# Patient Record
Sex: Male | Born: 1950 | Race: White | Hispanic: No | Marital: Married | Smoking: Current every day smoker
Health system: Southern US, Community
[De-identification: ages and names within clinical notes are randomized; demographics above are authoritative.]

## PROBLEM LIST (undated history)

## (undated) DIAGNOSIS — I2510 Atherosclerotic heart disease of native coronary artery without angina pectoris: Secondary | ICD-10-CM

## (undated) DIAGNOSIS — G709 Myoneural disorder, unspecified: Secondary | ICD-10-CM

## (undated) DIAGNOSIS — T8859XA Other complications of anesthesia, initial encounter: Secondary | ICD-10-CM

## (undated) DIAGNOSIS — Z9889 Other specified postprocedural states: Secondary | ICD-10-CM

## (undated) DIAGNOSIS — M4800 Spinal stenosis, site unspecified: Secondary | ICD-10-CM

## (undated) DIAGNOSIS — J439 Emphysema, unspecified: Secondary | ICD-10-CM

## (undated) DIAGNOSIS — R7303 Prediabetes: Secondary | ICD-10-CM

## (undated) DIAGNOSIS — T4145XA Adverse effect of unspecified anesthetic, initial encounter: Secondary | ICD-10-CM

## (undated) DIAGNOSIS — E119 Type 2 diabetes mellitus without complications: Secondary | ICD-10-CM

## (undated) DIAGNOSIS — E785 Hyperlipidemia, unspecified: Secondary | ICD-10-CM

## (undated) DIAGNOSIS — Z720 Tobacco use: Secondary | ICD-10-CM

## (undated) DIAGNOSIS — R112 Nausea with vomiting, unspecified: Secondary | ICD-10-CM

## (undated) DIAGNOSIS — N529 Male erectile dysfunction, unspecified: Secondary | ICD-10-CM

## (undated) DIAGNOSIS — I700 Atherosclerosis of aorta: Secondary | ICD-10-CM

## (undated) DIAGNOSIS — K5790 Diverticulosis of intestine, part unspecified, without perforation or abscess without bleeding: Secondary | ICD-10-CM

## (undated) DIAGNOSIS — M1990 Unspecified osteoarthritis, unspecified site: Secondary | ICD-10-CM

## (undated) DIAGNOSIS — I714 Abdominal aortic aneurysm, without rupture, unspecified: Secondary | ICD-10-CM

## (undated) DIAGNOSIS — T7840XA Allergy, unspecified, initial encounter: Secondary | ICD-10-CM

## (undated) DIAGNOSIS — Z7982 Long term (current) use of aspirin: Secondary | ICD-10-CM

## (undated) DIAGNOSIS — K760 Fatty (change of) liver, not elsewhere classified: Secondary | ICD-10-CM

## (undated) DIAGNOSIS — C4491 Basal cell carcinoma of skin, unspecified: Secondary | ICD-10-CM

## (undated) DIAGNOSIS — I7143 Infrarenal abdominal aortic aneurysm, without rupture: Secondary | ICD-10-CM

## (undated) DIAGNOSIS — C801 Malignant (primary) neoplasm, unspecified: Secondary | ICD-10-CM

## (undated) DIAGNOSIS — M48061 Spinal stenosis, lumbar region without neurogenic claudication: Secondary | ICD-10-CM

## (undated) DIAGNOSIS — I5189 Other ill-defined heart diseases: Secondary | ICD-10-CM

## (undated) DIAGNOSIS — I10 Essential (primary) hypertension: Secondary | ICD-10-CM

## (undated) DIAGNOSIS — E559 Vitamin D deficiency, unspecified: Secondary | ICD-10-CM

## (undated) DIAGNOSIS — K219 Gastro-esophageal reflux disease without esophagitis: Secondary | ICD-10-CM

## (undated) DIAGNOSIS — L409 Psoriasis, unspecified: Secondary | ICD-10-CM

## (undated) DIAGNOSIS — K439 Ventral hernia without obstruction or gangrene: Secondary | ICD-10-CM

## (undated) DIAGNOSIS — Z860101 Personal history of adenomatous and serrated colon polyps: Secondary | ICD-10-CM

## (undated) DIAGNOSIS — I739 Peripheral vascular disease, unspecified: Secondary | ICD-10-CM

## (undated) DIAGNOSIS — Z87891 Personal history of nicotine dependence: Secondary | ICD-10-CM

## (undated) DIAGNOSIS — E7800 Pure hypercholesterolemia, unspecified: Secondary | ICD-10-CM

## (undated) HISTORY — DX: Myoneural disorder, unspecified: G70.9

## (undated) HISTORY — PX: WRIST SURGERY: SHX841

## (undated) HISTORY — PX: FRACTURE SURGERY: SHX138

## (undated) HISTORY — PX: SKIN CANCER EXCISION: SHX779

## (undated) HISTORY — DX: Hyperlipidemia, unspecified: E78.5

## (undated) HISTORY — PX: ELBOW SURGERY: SHX618

## (undated) HISTORY — DX: Allergy, unspecified, initial encounter: T78.40XA

## (undated) HISTORY — DX: Basal cell carcinoma of skin, unspecified: C44.91

## (undated) HISTORY — PX: BACK SURGERY: SHX140

## (undated) HISTORY — DX: Unspecified osteoarthritis, unspecified site: M19.90

## (undated) HISTORY — PX: SPINE SURGERY: SHX786

## (undated) HISTORY — DX: Personal history of nicotine dependence: Z87.891

## (undated) HISTORY — DX: Essential (primary) hypertension: I10

## (undated) HISTORY — DX: Spinal stenosis, site unspecified: M48.00

---
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
## 2004-03-21 ENCOUNTER — Other Ambulatory Visit: Payer: Self-pay

## 2005-09-30 DIAGNOSIS — E291 Testicular hypofunction: Secondary | ICD-10-CM | POA: Insufficient documentation

## 2006-04-06 ENCOUNTER — Ambulatory Visit: Payer: Self-pay | Admitting: Otolaryngology

## 2007-06-06 DIAGNOSIS — L219 Seborrheic dermatitis, unspecified: Secondary | ICD-10-CM | POA: Insufficient documentation

## 2007-06-06 DIAGNOSIS — K760 Fatty (change of) liver, not elsewhere classified: Secondary | ICD-10-CM | POA: Insufficient documentation

## 2008-09-01 ENCOUNTER — Ambulatory Visit: Payer: Self-pay | Admitting: Family Medicine

## 2008-12-30 ENCOUNTER — Ambulatory Visit: Payer: Self-pay | Admitting: Family Medicine

## 2009-01-28 ENCOUNTER — Ambulatory Visit: Payer: Self-pay | Admitting: Family Medicine

## 2009-10-12 DIAGNOSIS — M2550 Pain in unspecified joint: Secondary | ICD-10-CM | POA: Insufficient documentation

## 2010-04-02 ENCOUNTER — Ambulatory Visit: Payer: Self-pay | Admitting: Family Medicine

## 2010-08-25 ENCOUNTER — Ambulatory Visit: Payer: Self-pay | Admitting: Family Medicine

## 2011-09-30 ENCOUNTER — Ambulatory Visit: Payer: Self-pay | Admitting: Family Medicine

## 2011-10-03 DIAGNOSIS — R053 Chronic cough: Secondary | ICD-10-CM | POA: Insufficient documentation

## 2011-10-03 DIAGNOSIS — R05 Cough: Secondary | ICD-10-CM | POA: Insufficient documentation

## 2012-07-12 ENCOUNTER — Ambulatory Visit: Payer: Self-pay | Admitting: Family Medicine

## 2013-08-05 ENCOUNTER — Ambulatory Visit: Payer: Self-pay | Admitting: Family Medicine

## 2014-07-08 LAB — TSH: TSH: 3.51 u[IU]/mL (ref <=5.90)

## 2014-10-01 LAB — HEPATIC FUNCTION PANEL
ALT: 29 U/L (ref 10–40)
AST: 24 U/L (ref 14–40)

## 2014-10-01 LAB — BASIC METABOLIC PANEL
BUN: 11 mg/dL (ref 4–21)
CREATININE: 0.8 mg/dL (ref <=1.3)
GLUCOSE: 172 mg/dL
Potassium: 4.3 mmol/L (ref 3.4–5.3)
SODIUM: 142 mmol/L (ref 137–147)

## 2014-10-01 LAB — PSA: PSA: 0.3

## 2014-10-01 LAB — LIPID PANEL
Cholesterol: 210 mg/dL — AB (ref 0–200)
HDL: 25 mg/dL — AB (ref 35–70)
LDL Cholesterol: 111 mg/dL
TRIGLYCERIDES: 371 mg/dL — AB (ref 40–160)

## 2014-12-19 ENCOUNTER — Ambulatory Visit: Payer: Self-pay | Admitting: Gastroenterology

## 2014-12-19 LAB — HM COLONOSCOPY

## 2014-12-25 LAB — HEMOGLOBIN A1C: Hgb A1c MFr Bld: 6.8 % — AB (ref 4.0–6.0)

## 2015-04-24 DIAGNOSIS — M549 Dorsalgia, unspecified: Secondary | ICD-10-CM

## 2015-04-24 DIAGNOSIS — E119 Type 2 diabetes mellitus without complications: Secondary | ICD-10-CM | POA: Insufficient documentation

## 2015-04-24 DIAGNOSIS — G8929 Other chronic pain: Secondary | ICD-10-CM | POA: Insufficient documentation

## 2015-04-24 DIAGNOSIS — Z86010 Personal history of colonic polyps: Secondary | ICD-10-CM | POA: Insufficient documentation

## 2015-04-24 DIAGNOSIS — Z860101 Personal history of adenomatous and serrated colon polyps: Secondary | ICD-10-CM | POA: Insufficient documentation

## 2015-04-24 DIAGNOSIS — I10 Essential (primary) hypertension: Secondary | ICD-10-CM | POA: Insufficient documentation

## 2015-04-24 DIAGNOSIS — N509 Disorder of male genital organs, unspecified: Secondary | ICD-10-CM | POA: Insufficient documentation

## 2015-04-24 DIAGNOSIS — K219 Gastro-esophageal reflux disease without esophagitis: Secondary | ICD-10-CM | POA: Insufficient documentation

## 2015-04-24 DIAGNOSIS — J300 Vasomotor rhinitis: Secondary | ICD-10-CM | POA: Insufficient documentation

## 2015-04-24 DIAGNOSIS — E669 Obesity, unspecified: Secondary | ICD-10-CM | POA: Insufficient documentation

## 2015-04-24 DIAGNOSIS — N529 Male erectile dysfunction, unspecified: Secondary | ICD-10-CM | POA: Insufficient documentation

## 2015-04-24 DIAGNOSIS — D369 Benign neoplasm, unspecified site: Secondary | ICD-10-CM | POA: Insufficient documentation

## 2015-04-24 DIAGNOSIS — J309 Allergic rhinitis, unspecified: Secondary | ICD-10-CM | POA: Insufficient documentation

## 2015-04-30 ENCOUNTER — Encounter: Payer: Self-pay | Admitting: Family Medicine

## 2015-04-30 ENCOUNTER — Ambulatory Visit (INDEPENDENT_AMBULATORY_CARE_PROVIDER_SITE_OTHER): Payer: BC Managed Care – PPO | Admitting: Family Medicine

## 2015-04-30 VITALS — BP 102/66 | HR 73 | Temp 98.6°F | Resp 16 | Wt 215.0 lb

## 2015-04-30 DIAGNOSIS — D369 Benign neoplasm, unspecified site: Secondary | ICD-10-CM

## 2015-04-30 DIAGNOSIS — K219 Gastro-esophageal reflux disease without esophagitis: Secondary | ICD-10-CM | POA: Diagnosis not present

## 2015-04-30 DIAGNOSIS — R208 Other disturbances of skin sensation: Secondary | ICD-10-CM | POA: Diagnosis not present

## 2015-04-30 DIAGNOSIS — E785 Hyperlipidemia, unspecified: Secondary | ICD-10-CM

## 2015-04-30 DIAGNOSIS — E119 Type 2 diabetes mellitus without complications: Secondary | ICD-10-CM

## 2015-04-30 DIAGNOSIS — I10 Essential (primary) hypertension: Secondary | ICD-10-CM | POA: Diagnosis not present

## 2015-04-30 DIAGNOSIS — R200 Anesthesia of skin: Secondary | ICD-10-CM

## 2015-04-30 LAB — POCT GLYCOSYLATED HEMOGLOBIN (HGB A1C)
Est. average glucose Bld gHb Est-mCnc: 137
HEMOGLOBIN A1C: 6.4

## 2015-04-30 NOTE — Progress Notes (Signed)
Patient: Thomas Morgan Male    DOB: [birth year]   64 y.o.   MRN: [id number] Visit Date: 04/30/2015  Today's Provider: Lelon Huh, MD   Chief Complaint  Patient presents with  . Follow-up    4 month  . Diabetes  . Hypertension   Subjective:    Diabetes He presents for his follow-up diabetic visit. He has type 2 diabetes mellitus. His disease course has been stable. Hypoglycemia symptoms include headaches. Pertinent negatives for hypoglycemia include no dizziness. Pertinent negatives for diabetes include no blurred vision, no chest pain, no fatigue and no visual change. Risk factors for coronary artery disease include hypertension. He is compliant with treatment all of the time. His weight is stable.  Hypertension This is a chronic problem. The current episode started more than 1 year ago. The problem is unchanged. The problem is controlled. Associated symptoms include headaches. Pertinent negatives include no blurred vision, chest pain or palpitations. Risk factors for coronary artery disease include diabetes mellitus.     Diabetes Mellitus Type II, Follow-up:   Lab Results  Component Value Date   HGBA1C 6.4 04/30/2015   HGBA1C 6.8* 12/25/2014   Last seen for diabetes 3 months ago.  Management changes included Advised to continue to stay off metformin for now . He reports good compliance with treatment. He is not having side effects. none Current symptoms include Some numbness in both feet and have been unchanged. Home blood sugar records: fasting range: 130  Episodes of hypoglycemia? no   Current Insulin Regimen: n/a Most Recent Eye Exam: within the last year Weight trend: decreasing steadily Current diet: well balanced Current exercise: yard work  ------------------------------------------------------------------------   Hypertension, follow-up:  BP Readings from Last 3 Encounters:  04/30/15 102/66  12/25/14 134/72    He was last seen for hypertension 6  months ago.  BP at that visit was 136/86. Management changes since that visit include none .He reports good compliance with treatment. He is not having side effects. none  He is exercising. He is not adherent to low salt diet.   Outside blood pressures are 102/60 . He is experiencing none.  Patient denies none.   Use of agents associated with hypertension: none.  Wt Readings from Last 3 Encounters:  04/30/15 215 lb (97.523 kg)  12/25/14 219 lb (99.338 kg)    ------------------------------------------------------------------------  Hyperlipidemia.  He is doing well with current dose of lovastatin without adverse side effects. He is taking consistently. He is trying to follow a low saturated fat diet. Lovastatin was increased after labs checked in December.    Lab Results  Component Value Date   CHOL 210* 10/01/2014   HDL 25* 10/01/2014   LDLCALC 111 10/01/2014   TRIG 371* 10/01/2014       Previous Medications   ASPIRIN (ASPIR-LOW) 81 MG EC TABLET    Take by mouth.   HYDROCHLOROTHIAZIDE (HYDRODIURIL) 50 MG TABLET    Take by mouth.   LOVASTATIN (MEVACOR) 40 MG TABLET    Take by mouth.   MULTIPLE VITAMIN (MULTIVITAMINS PO)    Take by mouth.   OMEPRAZOLE (PRILOSEC) 40 MG CAPSULE    Take by mouth.   TADALAFIL (CIALIS) 5 MG TABLET    Take by mouth.   VITAMIN C (ASCORBIC ACID) 500 MG TABLET    Take by mouth.    Review of Systems  Constitutional: Negative for fatigue.  Eyes: Negative for blurred vision.  Cardiovascular: Negative for chest pain, palpitations  and leg swelling.  Neurological: Positive for headaches. Negative for dizziness.    History  Substance Use Topics  . Smoking status: Former Research scientist (life sciences)  . Smokeless tobacco: Not on file     Comment: Smoked about 1 pack per day, for 35 years, quit smoking in 2013. smoked cigarettes, Current every day smoker, uses e cigarettes.  . Alcohol Use: No   Objective:   BP 102/66 mmHg  Pulse 73  Temp(Src) 98.6 F (37 C) (Oral)   Resp 16  Wt 215 lb (97.523 kg)  SpO2 96%  Physical Exam   General Appearance:    Alert, cooperative, no distress, overweight  Eyes:    PERRL, conjunctiva/corneas clear, EOM's intact       Lungs:     Clear to auscultation bilaterally, respirations unlabored  Heart:    Regular rate and rhythm. +2 bipedal pulses.   Neurologic:   Awake, alert, oriented x 3. No apparent focal neurological           defect.        Results for orders placed or performed in visit on 04/30/15  POCT HgB A1C  Result Value Ref Range   Hemoglobin A1C 6.4    Est. average glucose Bld gHb Est-mCnc 137        Assessment & Plan:     1. Type 2 diabetes mellitus without complication Doing well with diet - POCT HgB A1C - Renal function panel  2. HLD (hyperlipidemia) Tolerating increase dose of lovastatin - Lipid panel - Hepatic function panel  3. Gastroesophageal reflux disease, esophagitis presence not specified Well controlled Patient Instructions  Try putting omeprazole on hold to see if you can control acid reflux with diet alone. If you have reflux symptoms more than twice a week, you will need to start back on omeprazole.   - Magnesium - Vitamin B12  4. Essential hypertension well controlled   5. Numbness in feet He states he has some mild numbness on the anterior-plantar aspect of both feet, but no burning. 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 Try OTC oral B12 and check B12 levels.  - Vitamin B12    Follow up: No Follow-up on file.      Lelon Huh, MD  [facility]

## 2015-04-30 NOTE — Patient Instructions (Signed)
Try putting omeprazole on hold to see if you can control acid reflux with diet alone. If you have reflux symptoms more than twice a week, you will need to start back on omeprazole.

## 2015-05-09 LAB — LIPID PANEL
CHOL/HDL RATIO: 6.1 ratio — AB (ref 0.0–5.0)
Cholesterol, Total: 183 mg/dL (ref 100–199)
HDL: 30 mg/dL — AB (ref >39)
LDL Calculated: 117 mg/dL — ABNORMAL HIGH (ref 0–99)
Triglycerides: 180 mg/dL — ABNORMAL HIGH (ref 0–149)
VLDL Cholesterol Cal: 36 mg/dL (ref 5–40)

## 2015-05-09 LAB — RENAL FUNCTION PANEL
Albumin: 4.4 g/dL (ref 3.6–4.8)
BUN/Creatinine Ratio: 20 (ref 10–22)
BUN: 17 mg/dL (ref 8–27)
CALCIUM: 9.4 mg/dL (ref 8.6–10.2)
CHLORIDE: 98 mmol/L (ref 97–108)
CO2: 25 mmol/L (ref 18–29)
CREATININE: 0.86 mg/dL (ref 0.76–1.27)
GFR calc non Af Amer: 92 mL/min/{1.73_m2} (ref >59)
GFR, EST AFRICAN AMERICAN: 106 mL/min/{1.73_m2} (ref >59)
Glucose: 117 mg/dL — ABNORMAL HIGH (ref 65–99)
PHOSPHORUS: 3.5 mg/dL (ref 2.5–4.5)
Potassium: 4 mmol/L (ref 3.5–5.2)
Sodium: 141 mmol/L (ref 134–144)

## 2015-05-09 LAB — HEPATIC FUNCTION PANEL
ALT: 15 IU/L (ref 0–44)
AST: 12 IU/L (ref 0–40)
Alkaline Phosphatase: 76 IU/L (ref 39–117)
BILIRUBIN TOTAL: 0.4 mg/dL (ref 0.0–1.2)
Bilirubin, Direct: 0.12 mg/dL (ref 0.00–0.40)
Total Protein: 6.4 g/dL (ref 6.0–8.5)

## 2015-05-09 LAB — VITAMIN B12: Vitamin B-12: 439 pg/mL (ref 211–946)

## 2015-05-09 LAB — MAGNESIUM: Magnesium: 2.2 mg/dL (ref 1.6–2.3)

## 2015-08-25 ENCOUNTER — Other Ambulatory Visit: Payer: Self-pay | Admitting: Family Medicine

## 2015-10-08 ENCOUNTER — Ambulatory Visit (INDEPENDENT_AMBULATORY_CARE_PROVIDER_SITE_OTHER): Payer: Medicare Other | Admitting: Family Medicine

## 2015-10-08 ENCOUNTER — Encounter: Payer: Self-pay | Admitting: Family Medicine

## 2015-10-08 VITALS — BP 120/78 | HR 72 | Temp 98.3°F | Resp 16 | Ht 69.18 in | Wt 222.0 lb

## 2015-10-08 DIAGNOSIS — E785 Hyperlipidemia, unspecified: Secondary | ICD-10-CM

## 2015-10-08 DIAGNOSIS — K219 Gastro-esophageal reflux disease without esophagitis: Secondary | ICD-10-CM

## 2015-10-08 DIAGNOSIS — E291 Testicular hypofunction: Secondary | ICD-10-CM

## 2015-10-08 DIAGNOSIS — Z0000 Encounter for general adult medical examination without abnormal findings: Secondary | ICD-10-CM

## 2015-10-08 DIAGNOSIS — Z1159 Encounter for screening for other viral diseases: Secondary | ICD-10-CM

## 2015-10-08 DIAGNOSIS — Z125 Encounter for screening for malignant neoplasm of prostate: Secondary | ICD-10-CM | POA: Diagnosis not present

## 2015-10-08 DIAGNOSIS — N528 Other male erectile dysfunction: Secondary | ICD-10-CM

## 2015-10-08 DIAGNOSIS — N529 Male erectile dysfunction, unspecified: Secondary | ICD-10-CM

## 2015-10-08 DIAGNOSIS — E119 Type 2 diabetes mellitus without complications: Secondary | ICD-10-CM | POA: Diagnosis not present

## 2015-10-08 DIAGNOSIS — Z720 Tobacco use: Secondary | ICD-10-CM | POA: Diagnosis not present

## 2015-10-08 DIAGNOSIS — R202 Paresthesia of skin: Secondary | ICD-10-CM

## 2015-10-08 DIAGNOSIS — Z23 Encounter for immunization: Secondary | ICD-10-CM

## 2015-10-08 DIAGNOSIS — I10 Essential (primary) hypertension: Secondary | ICD-10-CM

## 2015-10-08 DIAGNOSIS — R200 Anesthesia of skin: Secondary | ICD-10-CM | POA: Insufficient documentation

## 2015-10-08 MED ORDER — VARDENAFIL HCL 20 MG PO TABS: 20.0000 mg | ORAL_TABLET | Freq: Every day | ORAL | Status: DC | PRN

## 2015-10-08 NOTE — Progress Notes (Signed)
Patient: Thomas Morgan, Male    DOB: 08-01-1951, 64 y.o.   MRN: [id number] Visit Date: 10/08/2015  Today's Provider: Lelon Huh, MD   Chief Complaint  Patient presents with  . Medicare Wellness  . Diabetes  . Hypertension  . Gastroesophageal Reflux   Subjective:    Annual wellness visit Thomas Morgan is a 64 y.o. male. He feels fairly well. He reports exercising no/but stays active. He reports he is sleeping well.  -----------------------------------------------------------  Follow-up for GERD from 04/30/2015; recommended patient tr y to put omeprazole on hold for now and see if patient can control acid reflux with diet alone.    Diabetes Mellitus Type II, Follow-up:   Lab Results  Component Value Date   HGBA1C 6.4 04/30/2015   HGBA1C 6.8* 12/25/2014   Last seen for diabetes 5 months ago.  Management since then includes; no changes. He reports good compliance with treatment. He is having side effects. Burning, tingling, and numbness in both feet Current symptoms include  Burning, tingling, and numbness in both feet and have been stable. Home blood sugar records: fasting range: 130  Episodes of hypoglycemia? no   Current Insulin Regimen: n/a Most Recent Eye Exam: 09/24/2015 Weight trend: stable Prior visit with dietician: no Current diet: in general, a "healthy" diet   Current exercise: none  ----------------------------------------------------------------------   Hypertension, follow-up:  BP Readings from Last 3 Encounters:  10/08/15 120/78  04/30/15 102/66  12/25/14 134/72    He was last seen for hypertension 5 months ago.  BP at that visit was 102/66. Management since that visit includes; no changes.He reports good compliance with treatment. He is not having side effects. none  He is not exercising. He is adherent to low salt diet.   Outside blood pressures are normal. He is experiencing none.  Patient denies none.   Cardiovascular risk  factors include diabetes mellitus.  Use of agents associated with hypertension: none.   ----------------------------------------------------------------------    Numbness in feet States that both forefeet get numb and sting somewhat at night or when at rest. He has no calf pain and no pain when walking or exercise.   Mucous in chest Takes occasional allergy tablets. No coughing or wheezing. No dyspnea. Stays congested in his nose a lot.   ED States Cialis is not working as well as in the past. He is getting Married in May and wants to try different ED medication.     Review of Systems  Constitutional: Negative.   HENT: Positive for congestion and sinus pressure.   Eyes: Negative.   Respiratory: Positive for cough and chest tightness.        Last month has had chest tightness and a dry cough  Cardiovascular: Negative.   Gastrointestinal: Negative.   Endocrine: Positive for cold intolerance and polyuria.  Genitourinary: Negative.   Musculoskeletal: Positive for back pain, arthralgias and neck pain.  Skin: Negative.   Allergic/Immunologic: Positive for environmental allergies.  Neurological: Positive for numbness.       Bilateral numbness and tingling in feet, burning sensation. Right hand often goes to sleep/numb  Hematological: Negative.   Psychiatric/Behavioral: Negative.     Social History   Social History  . Marital Status: Married    Spouse Name: N/A  . Number of Children: N/A  . Years of Education: N/A   Occupational History  . Not on file.   Social History Main Topics  . Smoking status: Former Research scientist (life sciences)  . Smokeless  tobacco: Not on file     Comment: Smoked about 1 pack per day, for 35 years, quit smoking in 2013. smoked cigarettes, Current every day smoker, uses e cigarettes.  . Alcohol Use: No  . Drug Use: No  . Sexual Activity: Not on file   Other Topics Concern  . Not on file   Social History Narrative    Patient Active Problem List   Diagnosis Date  Noted  . Tobacco abuse 10/08/2015  . Numbness and tingling of foot 10/08/2015  . Allergic rhinitis 04/24/2015  . Back pain, chronic 04/24/2015  . Diabetes ([place]) 04/24/2015  . 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 GERD (gastroesophageal reflux disease) 04/24/2015  . Hypertension 04/24/2015  . Obesity 04/24/2015  . Disorder of male genital organs 04/24/2015  . Tubular adenoma 04/24/2015  . 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 Chronic cough 10/03/2011  . Ventral hernia 05/25/2010  . Hyperlipidemia 10/12/2009  . Arthralgia of multiple joints 10/12/2009  . Hypersomnia 08/22/2008  . Psoriasis 08/22/2008  . Hypersteatosis 06/06/2007  . 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 Tobacco abuse 10/24/1968    Past Surgical History  Procedure Laterality Date  . 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    Previous Medications   ASPIRIN (ASPIR-LOW) 81 MG EC TABLET    Take by mouth.   HYDROCHLOROTHIAZIDE (HYDRODIURIL) 50 MG TABLET    TAKE 1 TABLET BY MOUTH IN THE MORNING FOR BLOOD PRESSURE   LOVASTATIN (MEVACOR) 40 MG TABLET    Take by mouth.   MULTIPLE VITAMIN (MULTIVITAMINS PO)    Take by mouth.   OMEPRAZOLE (PRILOSEC) 40 MG CAPSULE    Take by mouth.   TADALAFIL (CIALIS) 5 MG TABLET    Take by mouth.   VITAMIN C (ASCORBIC ACID) 500 MG TABLET    Take by mouth.    Patient Care Team: Birdie Sons, MD as PCP - General (Family Medicine)     Objective:   Vitals: BP 120/78 mmHg  Pulse 72  Temp(Src) 98.3 F (36.8 C) (Oral)  Resp 16  Ht 5' 9.18" (1.757 m)  Wt 222 lb (100.699 kg)  BMI 32.62 kg/m2  SpO2 96%  Physical Exam   General Appearance:    Alert, cooperative, no distress, appears stated age,  obese  Head:    Normocephalic, without obvious abnormality, atraumatic  Eyes:    PERRL, conjunctiva/corneas clear, EOM's intact, fundi    benign, both eyes       Ears:    Normal TM's and external ear canals, both ears  Nose:   Nares normal, septum midline, mucosa normal, no drainage   or sinus tenderness  Throat:   Lips, mucosa, and tongue normal; teeth and gums normal  Neck:   Supple, symmetrical, trachea midline, no adenopathy;       thyroid:  No enlargement/tenderness/nodules; no carotid   bruit or JVD  Back:     Symmetric, no curvature, ROM normal, no CVA tenderness  Lungs:     Clear to auscultation bilaterally, respirations unlabored  Chest wall:    No tenderness or deformity  Heart:    Regular rate and rhythm, S1 and S2 normal, no murmur, rub   or gallop  Abdomen:  Soft, non-tender, bowel sounds active all four quadrants,    no masses, no organomegaly  Genitalia:    deferred  Rectal:    deferred  Extremities:   Extremities normal, atraumatic, no cyanosis or edema  Pulses:   2+ and symmetric all extremities  Skin:   Skin color, texture, turgor normal, no rashes or lesions  Lymph nodes:   Cervical, supraclavicular, and axillary nodes normal  Neurologic:   CNII-XII intact. Normal strength, sensation and reflexes      throughout   Diabetic Foot Exam - Simple   Simple Foot Form  Diabetic Foot exam was performed with the following findings:  Yes 10/08/2015 11:05 AM  Visual Inspection  No deformities, no ulcerations, no other skin breakdown bilaterally:  Yes  Sensation Testing  Intact to touch and monofilament testing bilaterally:  Yes  Pulse Check  Posterior Tibialis and Dorsalis pulse intact bilaterally:  Yes  Comments      Activities of Daily Living No flowsheet data found.  Fall Risk Assessment Fall Risk  10/08/2015  Falls in the past year? No     Depression Screen PHQ 2/9 Scores 10/08/2015  PHQ - 2 Score 0  PHQ- 9 Score 2    Cognitive Testing - 6-CIT   Correct? Score   What year is it? yes 0 0 or 4  What month is it? yes 0 0 or 3  Memorize:    Pia Mau,  41,  [place],      What time is it? (within 1 hour) yes 0 0 or 3  Count backwards from 20 yes 0 0, 2, or 4  Name the months of the year no 0 0, 2, or 4  Repeat name & address above yes 4 0, 2, 4, 6, 8, or 10       TOTAL SCORE  4/28   Interpretation:  Normal  Normal (0-7) Abnormal (8-28)    Audit-C Alcohol Use Screening  Question Answer Points  How often do you have alcoholic drink? monthly 1  On days you do drink alcohol, how many drinks do you typically consume? 1 or2 0  How oftey will you drink 6 or more in a total? never 1  Total Score:  1   A score of 3 or more in women, and 4 or more in men indicates increased risk for alcohol abuse, EXCEPT if all of the points are from question 1.     Assessment & Plan:     Annual Wellness Visit  Reviewed patient's Family Medical History Reviewed and updated list of patient's medical providers Assessment of cognitive impairment was done Assessed patient's functional ability Established a written schedule for health screening [place] Completed and Reviewed  Exercise Activities and Dietary recommendations Goals    None      Immunization History  Administered Date(s) Administered  . Influenza, High Dose Seasonal PF 10/08/2015  . Pneumococcal Conjugate-13 10/08/2015  . Pneumococcal Polysaccharide-23 09/10/2008  . Td 10/24/1997  . Tdap 06/06/2007    Health Maintenance  Topic Date Due  . Hepatitis C Screening  10[birth year]29/49  . FOOT EXAM  06/07/1960  . OPHTHALMOLOGY EXAM  06/07/1960  . URINE MICROALBUMIN  06/07/1960  . HIV Screening  06/07/1965  . ZOSTAVAX  06/07/2010  . INFLUENZA VACCINE  05/25/2015  . PNA vac Low Risk Adult (1 of 2 - PCV13) 06/08/2015  . HEMOGLOBIN A1C  10/31/2015  . TETANUS/TDAP  06/05/2017  . COLONOSCOPY  12/20/2019  Discussed health benefits of physical  activity, and encouraged him to engage in regular exercise appropriate for his age and condition.    --------------------------------------------------------------------------------   1. Annual physical exam   2. Type 2 diabetes mellitus without complication, without long-term current use of insulin (HCC) Diet controlled - Hemoglobin A1c  3. HLD (hyperlipidemia) He is tolerating lovastatin well with no adverse effects.    4. Essential hypertension Well controlled.  Continue current medications.   - EKG 12-Lead  5. Gastroesophageal reflux disease, esophagitis presence not specified Continue omeprazole which remains effective.   6. Testicular hypofunction   7. Tobacco abuse Counseled again on importance of smoking cessation. His fiance also smokes and he working on trying to get her to quit with him  - CT CHEST LUNG CA SCREEN LOW DOSE W/O CM; Future  8. Prostate cancer screening  - PSA  9. 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 Need for hepatitis C screening test  - Hepatitis C antibody  12. Need for influenza vaccination  - Flu vaccine HIGH DOSE PF  13. Need for pneumococcal vaccination  - Pneumococcal conjugate vaccine 13-valent IM  Recommended Shingles vaccine which he declined today.

## 2015-10-08 NOTE — Patient Instructions (Addendum)
Please contact your eyecare professional to schedule a routine eye exam  Use OTC fexofenadine or loratadine daily for allergies and sinus drainage.

## 2015-10-09 ENCOUNTER — Telehealth: Payer: Self-pay

## 2015-10-09 DIAGNOSIS — G5793 Unspecified mononeuropathy of bilateral lower limbs: Secondary | ICD-10-CM

## 2015-10-09 LAB — HEMOGLOBIN A1C
Est. average glucose Bld gHb Est-mCnc: 166 mg/dL
Hgb A1c MFr Bld: 7.4 % — ABNORMAL HIGH (ref 4.8–5.6)

## 2015-10-09 LAB — VITAMIN B12: VITAMIN B 12: 1319 pg/mL — AB (ref 211–946)

## 2015-10-09 LAB — HEPATITIS C ANTIBODY: Hep C Virus Ab: <0.1 s/co ratio (ref 0.0–0.9)

## 2015-10-09 LAB — VITAMIN D 25 HYDROXY (VIT D DEFICIENCY, FRACTURES): Vit D, 25-Hydroxy: 25.3 ng/mL — ABNORMAL LOW (ref 30.0–100.0)

## 2015-10-09 LAB — PSA: PROSTATE SPECIFIC AG, SERUM: 0.3 ng/mL (ref 0.0–4.0)

## 2015-10-09 MED ORDER — NORTRIPTYLINE HCL 25 MG PO CAPS: 25.0000 mg | ORAL_CAPSULE | Freq: Every day | ORAL | Status: DC

## 2015-10-09 NOTE — Telephone Encounter (Signed)
Patient advised as below and verbally voiced understanding. Prescription sent into pharmacy. Patient states he will call back to schedule a 3 month follow up appointment.

## 2015-10-09 NOTE — Telephone Encounter (Signed)
-----   Message from Birdie Sons, MD sent at 10/09/2015  2:04 PM EST ----- His hgbA1c is up 7.4, indicating average blood sugar of 166. He needs to get more strict with diet. Avoid sweets and starch foods, and exercise 150 minutes per week. All of his other labs are normal. 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 #30, rf x 3  Follow up o.v. 3-4 months regarding diabetes.

## 2015-10-22 ENCOUNTER — Other Ambulatory Visit: Payer: Self-pay | Admitting: Family Medicine

## 2015-10-22 ENCOUNTER — Encounter: Payer: Self-pay | Admitting: Family Medicine

## 2015-10-22 DIAGNOSIS — Z87891 Personal history of nicotine dependence: Secondary | ICD-10-CM

## 2015-10-22 HISTORY — DX: Personal history of nicotine dependence: Z87.891

## 2015-10-23 ENCOUNTER — Encounter: Payer: Self-pay | Admitting: Family Medicine

## 2015-10-23 ENCOUNTER — Inpatient Hospital Stay: Payer: Medicare Other | Attending: Family Medicine | Admitting: Family Medicine

## 2015-10-23 ENCOUNTER — Ambulatory Visit
Admission: RE | Admit: 2015-10-23 | Discharge: 2015-10-23 | Disposition: A | Discharge status: Home / Self Care | Payer: Medicare Other | Source: Ambulatory Visit | Attending: Family Medicine | Admitting: Family Medicine

## 2015-10-23 ENCOUNTER — Other Ambulatory Visit: Payer: Self-pay | Admitting: Family Medicine

## 2015-10-23 DIAGNOSIS — Z87891 Personal history of nicotine dependence: Secondary | ICD-10-CM

## 2015-10-23 DIAGNOSIS — Z122 Encounter for screening for malignant neoplasm of respiratory organs: Secondary | ICD-10-CM | POA: Diagnosis not present

## 2015-10-23 NOTE — Progress Notes (Signed)
In accordance with CMS guidelines, patient has meet eligibility criteria including age, absence of signs or symptoms of lung cancer, the specific calculation of cigarette smoking pack-years was 45 years and is a current smoker.   A shared decision-making session was conducted prior to the performance of CT scan. This includes one or more decision aids, includes benefits and harms of screening, follow-up diagnostic testing, over-diagnosis, false positive rate, and total radiation exposure.  Counseling on the importance of adherence to annual lung cancer LDCT screening, impact of co-morbidities, and ability or willingness to undergo diagnosis and treatment is imperative for compliance of the program.  Counseling on the importance of continued smoking cessation for former smokers; the importance of smoking cessation for current smokers and information about tobacco cessation interventions have been given to patient including the [place] at [facility], 1800 quit [place], as well as Cancer Center specific smoking cessation programs.  Written order for lung cancer screening with LDCT has been given to the patient and any and all questions have been answered to the best of my abilities.   Yearly follow up will be scheduled by Shawn Perkins, Thoracic Navigator.   

## 2015-10-24 ENCOUNTER — Telehealth: Payer: Self-pay | Admitting: Family Medicine

## 2015-10-24 NOTE — Telephone Encounter (Signed)
Lung CT is normal. Repeat yearly.

## 2015-10-26 ENCOUNTER — Telehealth: Payer: Self-pay | Admitting: *Deleted

## 2015-10-26 NOTE — Telephone Encounter (Signed)
Notified patient of LDCT lung cancer screening results of Lung Rads 1 finding with recommendation for 12 month follow up imaging.  Patient verbalizes understanding.   IMPRESSION: No suspicious pulmonary nodules. Lung-RADS Category 1, negative. Continue annual screening with low-dose chest CT without contrast in 12 months.

## 2015-10-27 ENCOUNTER — Other Ambulatory Visit: Payer: Self-pay | Admitting: Family Medicine

## 2015-10-27 NOTE — Telephone Encounter (Signed)
Advised patient of results.  

## 2015-11-17 ENCOUNTER — Encounter: Payer: Self-pay | Admitting: Family Medicine

## 2015-11-17 ENCOUNTER — Ambulatory Visit (INDEPENDENT_AMBULATORY_CARE_PROVIDER_SITE_OTHER): Payer: Medicare Other | Admitting: Family Medicine

## 2015-11-17 VITALS — BP 126/70 | HR 95 | Temp 98.7°F | Resp 18 | Wt 221.0 lb

## 2015-11-17 DIAGNOSIS — J069 Acute upper respiratory infection, unspecified: Secondary | ICD-10-CM | POA: Diagnosis not present

## 2015-11-17 DIAGNOSIS — H6121 Impacted cerumen, right ear: Secondary | ICD-10-CM

## 2015-11-17 DIAGNOSIS — R059 Cough, unspecified: Secondary | ICD-10-CM

## 2015-11-17 DIAGNOSIS — R05 Cough: Secondary | ICD-10-CM

## 2015-11-17 MED ORDER — FLUTICASONE PROPIONATE 50 MCG/ACT NA SUSP: 2.0000 | Freq: Every day | NASAL | Status: DC

## 2015-11-17 NOTE — Patient Instructions (Signed)
Take OTC Allegra (fexofenadine) or Claritin (loratadine) to reduce sinus drainage and cough

## 2015-11-17 NOTE — Progress Notes (Signed)
Patient: Thomas Morgan Male    DOB: 1951/02/06   65 y.o.   MRN: [id number] Visit Date: 11/17/2015  Today's Provider: Lelon Huh, MD   Chief Complaint  Patient presents with  . Ear Fullness    x2-3 weeks   Subjective:    Ear Fullness  There is pain in both (worse in right ear) ears. Episode onset: 2-3 weeks ago. The problem has been unchanged. There has been no fever. The patient is experiencing no pain. Associated symptoms include coughing (productive with white sputum) and rhinorrhea. Pertinent negatives include no abdominal pain, ear discharge, headaches, hearing loss, neck pain, rash, sore throat or vomiting. Treatments tried: Copywriter, advertising. The treatment provided mild relief.  Patient states he feels a tickle in his throat which causes him to cough.      Allergies  Allergen Reactions  . Crestor  [Rosuvastatin]   . Hydrocodone-Apap-Dietary Prod     Increase heartrate  . Lescol  [Fluvastatin]     Muscle Aches   Previous Medications   ASPIRIN (ASPIR-LOW) 81 MG EC TABLET    Take by mouth.   GLUCOSE BLOOD TEST STRIP    1 each by Other route daily. Use as instructed   HYDROCHLOROTHIAZIDE (HYDRODIURIL) 50 MG TABLET    TAKE 1 TABLET BY MOUTH IN THE MORNING FOR BLOOD PRESSURE   LOVASTATIN (MEVACOR) 40 MG TABLET    Take by mouth.   METFORMIN (GLUCOPHAGE-XR) 500 MG 24 HR TABLET    TAKE 1 TABLET BY MOUTH EVERY DAY   MULTIPLE VITAMIN (MULTIVITAMINS PO)    Take by mouth.   NORTRIPTYLINE (PAMELOR) 25 MG CAPSULE    Take 1 capsule (25 mg total) by mouth at bedtime. For Neuropathy of feet   OMEPRAZOLE (PRILOSEC) 40 MG CAPSULE    Take by mouth.   TADALAFIL (CIALIS) 5 MG TABLET    Take by mouth.   VARDENAFIL (LEVITRA) 20 MG TABLET    Take 1 tablet (20 mg total) by mouth daily as needed for erectile dysfunction.   VITAMIN C (ASCORBIC ACID) 500 MG TABLET    Take by mouth.    Review of Systems  Constitutional: Negative for fever, chills, diaphoresis, appetite change and fatigue.    HENT: Positive for postnasal drip and rhinorrhea. Negative for ear discharge, ear pain, hearing loss, mouth sores, nosebleeds and sore throat.   Eyes: Positive for discharge (watery eyes).  Respiratory: Positive for cough (productive with white sputum). Negative for chest tightness, shortness of breath and wheezing.   Cardiovascular: Negative for chest pain and palpitations.  Gastrointestinal: Negative for nausea, vomiting and abdominal pain.  Musculoskeletal: Negative for neck pain.  Skin: Negative for rash.  Neurological: Negative for headaches.    Social History  Substance Use Topics  . Smoking status: Smoker, Current Status Unknown -- 1.00 packs/day for 45 years    Types: Cigarettes  . Smokeless tobacco: Not on file  . Alcohol Use: No   Objective:   BP 126/70 mmHg  Pulse 95  Temp(Src) 98.7 F (37.1 C) (Oral)  Resp 18  Wt 221 lb (100.245 kg)  SpO2 95%  Physical Exam  General Appearance:    Alert, cooperative, no distress  HENT:   right ear canal obstructed with cerumen. Left TM normal without fluid or infection, neck without nodes, throat normal without erythema or exudate, sinuses nontender, post nasal drip noted and nasal mucosa pale and congested  Eyes:    PERRL, conjunctiva/corneas clear, EOM's intact  Lungs:     Clear to auscultation bilaterally, respirations unlabored  Heart:    Regular rate and rhythm  Neurologic:   Awake, alert, oriented x 3. No apparent focal neurological           defect.           Assessment & Plan:     1. Cough Likely secondary to post nasal drainage. Will try OTC claritin or allegra in addition to fluticasone nasal spray as below  2. Cerumen impaction, right After soaking with Debrox, ear canal was irrigated with water until clear. Patient tolerated procedure well.    3. Upper respiratory infection  - fluticasone (FLONASE) 50 MCG/ACT nasal spray; Place 2 sprays into both nostrils daily.  Dispense: 16 g; Refill: 6         Lelon Huh, MD  [facility]

## 2015-11-20 ENCOUNTER — Other Ambulatory Visit: Payer: Self-pay | Admitting: Family Medicine

## 2015-11-25 ENCOUNTER — Other Ambulatory Visit: Payer: Self-pay | Admitting: Family Medicine

## 2016-05-03 ENCOUNTER — Other Ambulatory Visit: Payer: Self-pay

## 2016-05-03 MED ORDER — HYDROCHLOROTHIAZIDE 50 MG PO TABS: ORAL_TABLET | ORAL | Status: DC

## 2016-05-03 NOTE — Telephone Encounter (Signed)
Pharmacy faxed refill request. 

## 2016-05-31 ENCOUNTER — Other Ambulatory Visit: Payer: Self-pay | Admitting: Family Medicine

## 2016-06-03 ENCOUNTER — Encounter: Payer: Self-pay | Admitting: Family Medicine

## 2016-06-03 ENCOUNTER — Ambulatory Visit (INDEPENDENT_AMBULATORY_CARE_PROVIDER_SITE_OTHER): Payer: Medicare Other | Admitting: Family Medicine

## 2016-06-03 VITALS — BP 148/76 | HR 60 | Temp 98.4°F | Resp 16 | Wt 209.0 lb

## 2016-06-03 DIAGNOSIS — R0609 Other forms of dyspnea: Secondary | ICD-10-CM

## 2016-06-03 DIAGNOSIS — R5383 Other fatigue: Secondary | ICD-10-CM | POA: Diagnosis not present

## 2016-06-03 NOTE — Progress Notes (Signed)
Patient: Thomas Morgan Male    DOB: [birth year]   65 y.o.   MRN: [id number] Visit Date: 06/03/2016  Today's Provider: Lelon Huh, MD   Chief Complaint  Patient presents with  . Fatigue    Started Tuesday 05/31/2016   Subjective:    HPI  Woke up 05/31/16 feeling extremely fatigued. Felt fine the days and night before. No difficulty sleeping. Returned from vacation to [employer] on 05/29/2016. No illnesses during vacation, no tick bits.  Gets a little  dyspnic exertion. No chest pain. No fevers, no joint swelling, no new aches or pains. Symptoms have not changed since onset three days ago. Has chronic allergy problems, no worse lately. Take an allergy pill three days ago, but known since. No new medications.     Allergies  Allergen Reactions  . Crestor  [Rosuvastatin]   . Hydrocodone-Apap-Dietary Prod     Increase heartrate  . Lescol  [Fluvastatin]     Muscle Aches   Current Meds  Medication Sig  . aspirin (ASPIR-LOW) 81 MG EC tablet Take by mouth.  . hydrochlorothiazide (HYDRODIURIL) 50 MG tablet Take 1 tablet (50 mg total) by mouth daily. PATIENT NEEDS TO SCHEDULE OFFICE VISIT FOR FOLLOW UP  . lovastatin (MEVACOR) 40 MG tablet TAKE 1 TABLET BY MOUTH DAILY  . Multiple Vitamin (MULTIVITAMINS PO) Take by mouth.  Marland Kitchen omeprazole (PRILOSEC) 40 MG capsule Take by mouth.  Glory Rosebush VERIO test strip TEST ONCE DAILY  . vitamin C (ASCORBIC ACID) 500 MG tablet Take by mouth.    Review of Systems  Constitutional: Positive for fatigue. Negative for activity change, appetite change, chills, diaphoresis, fever and unexpected weight change.  HENT: Positive for congestion, postnasal drip, rhinorrhea and sinus pressure. Negative for ear discharge, ear pain, nosebleeds, sore throat, tinnitus, trouble swallowing and voice change.   Eyes: Positive for discharge and itching.  Respiratory: Positive for shortness of breath. Negative for apnea, cough, choking, chest tightness, wheezing and  stridor.   Cardiovascular: Negative.   Gastrointestinal: Negative.   Musculoskeletal: Positive for arthralgias (Right shoulder pain.). Negative for back pain, gait problem, joint swelling, myalgias, neck pain and neck stiffness.  Neurological: Positive for dizziness and numbness (Numbness in his feet; also right hand ). Negative for tremors, seizures, syncope, weakness, light-headedness and headaches.  Hematological: Does not bruise/bleed easily.    Social History  Substance Use Topics  . Smoking status: Smoker, Current Status Unknown    Packs/day: 1.00    Years: 45.00    Types: Cigarettes  . Smokeless tobacco: Not on file  . Alcohol use No   Objective:   BP (!) 148/76 (BP Location: Left Arm, Patient Position: Sitting, Cuff Size: Normal)   Pulse 60   Temp 98.4 F (36.9 C) (Oral)   Resp 16   Wt 209 lb (94.8 kg)   SpO2 98%   BMI 29.99 kg/m   Physical Exam    General Appearance:    Alert, cooperative, no distress, appears stated age  Head:    Normocephalic, without obvious abnormality, atraumatic  Eyes:    PERRL, conjunctiva/corneas clear, EOM's intact, fundi    benign, both eyes       Ears:    Normal TM's and external ear canals, both ears  Nose:   Nares normal, septum midline, mucosa normal, no drainage   or sinus tenderness  Throat:   Lips, mucosa, and tongue normal; teeth and gums normal  Neck:   Supple,  symmetrical, trachea midline, no adenopathy;       thyroid:  No enlargement/tenderness/nodules; no carotid   bruit or JVD  Back:     Symmetric, no curvature, ROM normal, no CVA tenderness  Lungs:     Clear to auscultation bilaterally, respirations unlabored  Chest wall:    No tenderness or deformity  Heart:    Regular rate and rhythm, S1 and S2 normal, no murmur, rub   or gallop  Abdomen:     Soft, non-tender, bowel sounds active all four quadrants,    no masses, no organomegaly  Extremities:   Extremities normal, atraumatic, no cyanosis or edema  Pulses:   2+ and  symmetric all extremities  Skin:   Skin color, texture, turgor normal, no rashes or lesions  Lymph nodes:   Cervical, supraclavicular, and axillary nodes normal  Neurologic:   CNII-XII intact. Normal strength, sensation and reflexes      throughout   EKG: NSR, no ischemic changes.     Assessment & Plan:     1. Other fatigue Rapid onset, no clear etiology.  - EKG 12-Lead - CBC with Differential/Platelet - Comprehensive metabolic panel - T4 AND TSH - Brain natriuretic peptide  2. Dyspnea on exertion  - EKG 12-Lead - CBC with Differential/Platelet - Comprehensive metabolic panel - T4 AND TSH - Brain natriuretic peptide       Lelon Huh, MD  [facility]

## 2016-06-04 LAB — COMPREHENSIVE METABOLIC PANEL
A/G RATIO: 1.8 (ref 1.2–2.2)
ALT: 15 IU/L (ref 0–44)
AST: 14 IU/L (ref 0–40)
Albumin: 4.1 g/dL (ref 3.6–4.8)
Alkaline Phosphatase: 80 IU/L (ref 39–117)
BILIRUBIN TOTAL: 0.4 mg/dL (ref 0.0–1.2)
BUN/Creatinine Ratio: 21 (ref 10–24)
BUN: 16 mg/dL (ref 8–27)
CALCIUM: 9 mg/dL (ref 8.6–10.2)
CHLORIDE: 103 mmol/L (ref 96–106)
CO2: 26 mmol/L (ref 18–29)
Creatinine, Ser: 0.75 mg/dL — ABNORMAL LOW (ref 0.76–1.27)
GFR calc Af Amer: 111 mL/min/{1.73_m2} (ref >59)
GFR, EST NON AFRICAN AMERICAN: 96 mL/min/{1.73_m2} (ref >59)
GLOBULIN, TOTAL: 2.3 g/dL (ref 1.5–4.5)
Glucose: 109 mg/dL — ABNORMAL HIGH (ref 65–99)
POTASSIUM: 4.8 mmol/L (ref 3.5–5.2)
SODIUM: 142 mmol/L (ref 134–144)
Total Protein: 6.4 g/dL (ref 6.0–8.5)

## 2016-06-04 LAB — CBC WITH DIFFERENTIAL/PLATELET
BASOS: 1 %
Basophils Absolute: 0.1 10*3/uL (ref 0.0–0.2)
EOS (ABSOLUTE): 0.4 10*3/uL (ref 0.0–0.4)
EOS: 4 %
HEMATOCRIT: 42.3 % (ref 37.5–51.0)
Hemoglobin: 14.4 g/dL (ref 12.6–17.7)
IMMATURE GRANULOCYTES: 1 %
Immature Grans (Abs): 0.1 10*3/uL (ref 0.0–0.1)
LYMPHS ABS: 1.9 10*3/uL (ref 0.7–3.1)
Lymphs: 20 %
MCH: 31.5 pg (ref 26.6–33.0)
MCHC: 34 g/dL (ref 31.5–35.7)
MCV: 93 fL (ref 79–97)
MONOS ABS: 0.7 10*3/uL (ref 0.1–0.9)
Monocytes: 7 %
NEUTROS ABS: 6.8 10*3/uL (ref 1.4–7.0)
Neutrophils: 67 %
Platelets: 329 10*3/uL (ref 150–379)
RBC: 4.57 x10E6/uL (ref 4.14–5.80)
RDW: 13.7 % (ref 12.3–15.4)
WBC: 9.9 10*3/uL (ref 3.4–10.8)

## 2016-06-04 LAB — BRAIN NATRIURETIC PEPTIDE: BNP: 49.4 pg/mL (ref 0.0–100.0)

## 2016-06-04 LAB — T4 AND TSH
T4 TOTAL: 5.9 ug/dL (ref 4.5–12.0)
TSH: 4.08 u[IU]/mL (ref 0.450–4.500)

## 2016-06-28 ENCOUNTER — Telehealth: Payer: Self-pay

## 2016-06-28 NOTE — Telephone Encounter (Signed)
Opened in error.   Thanks,   -Laura  

## 2016-07-05 ENCOUNTER — Ambulatory Visit (INDEPENDENT_AMBULATORY_CARE_PROVIDER_SITE_OTHER): Payer: Medicare Other | Admitting: Family Medicine

## 2016-07-05 ENCOUNTER — Encounter: Payer: Self-pay | Admitting: Family Medicine

## 2016-07-05 VITALS — BP 132/82 | HR 72 | Temp 97.8°F | Resp 16 | Wt 212.0 lb

## 2016-07-05 DIAGNOSIS — E119 Type 2 diabetes mellitus without complications: Secondary | ICD-10-CM | POA: Diagnosis not present

## 2016-07-05 DIAGNOSIS — Z23 Encounter for immunization: Secondary | ICD-10-CM

## 2016-07-05 DIAGNOSIS — I10 Essential (primary) hypertension: Secondary | ICD-10-CM

## 2016-07-05 DIAGNOSIS — E785 Hyperlipidemia, unspecified: Secondary | ICD-10-CM

## 2016-07-05 LAB — POCT GLYCOSYLATED HEMOGLOBIN (HGB A1C)
Est. average glucose Bld gHb Est-mCnc: 134
Hemoglobin A1C: 6.3

## 2016-07-05 MED ORDER — LOVASTATIN 40 MG PO TABS: 40.0000 mg | ORAL_TABLET | Freq: Every day | ORAL | 3 refills | Status: DC

## 2016-07-05 MED ORDER — HYDROCHLOROTHIAZIDE 50 MG PO TABS: 50.0000 mg | ORAL_TABLET | Freq: Every day | ORAL | 3 refills | Status: DC

## 2016-07-05 NOTE — Progress Notes (Signed)
Patient: Thomas Morgan Male    DOB: 24-Apr-1951   65 y.o.   MRN: [id number] Visit Date: 07/05/2016  Today's Provider: Lelon Huh, MD   Chief Complaint  Patient presents with  . Diabetes  . Hypertension  . Hyperlipidemia   Subjective:    HPI  Diabetes Mellitus Type II, Follow-up:   Lab Results  Component Value Date   HGBA1C 6.3 07/05/2016   HGBA1C 7.4 (H) 10/08/2015   HGBA1C 6.4 04/30/2015   Last seen for diabetes 10/08/2015.  Management since then includes no changes. He reports poor compliance with treatment. He is not having side effects.  Current symptoms include paresthesia of the feet and have been stable.Was prescribed nortriptyline at last visit, but he did not take it.  Home blood sugar records: 130's  Episodes of hypoglycemia? no   Current Insulin Regimen:  Most Recent Eye Exam:  Weight trend: stable Prior visit with dietician: no Current diet: in general, a "healthy" diet   Current exercise: walking  ------------------------------------------------------------------------   Hypertension, follow-up:  BP Readings from Last 3 Encounters:  07/05/16 132/82  06/03/16 (!) 148/76  11/17/15 126/70    He was last seen for hypertension 10/08/2015.  BP at that visit was 120/78. Management since that visit includes no changes.He reports excellent compliance with treatment. He is not having side effects.  He is exercising. He is adherent to low salt diet.   Outside blood pressures are stable. He is experiencing none.  Patient denies chest pain.   Cardiovascular risk factors include advanced age (older than 109 for men, 23 for women), diabetes mellitus, hypertension and smoking/ tobacco exposure.  Use of agents associated with hypertension: none.   ------------------------------------------------------------------------    Lipid/Cholesterol, Follow-up:   Last seen for this 10/08/2015 Management since that visit includes no changes.  Last  Lipid Panel:    Component Value Date/Time   CHOL 183 05/08/2015 1120   TRIG 180 (H) 05/08/2015 1120   HDL 30 (L) 05/08/2015 1120   CHOLHDL 6.1 (H) 05/08/2015 1120   LDLCALC 117 (H) 05/08/2015 1120    He reports excellent compliance with treatment. He is not having side effects.   Wt Readings from Last 3 Encounters:  07/05/16 212 lb (96.2 kg)  06/03/16 209 lb (94.8 kg)  11/17/15 221 lb (100.2 kg)    ------------------------------------------------------------------------  Patient c/o of congestion, reports worse in the morning and when lying down. Patient reports he has been using Mucinex and Claritin and reports relief, but would like a prescription due to cost.     Allergies  Allergen Reactions  . Crestor  [Rosuvastatin]   . Hydrocodone-Apap-Dietary Prod     Increase heartrate  . Lescol  [Fluvastatin]     Muscle Aches     Current Outpatient Prescriptions:  .  aspirin (ASPIR-LOW) 81 MG EC tablet, Take by mouth., Disp: , Rfl:  .  hydrochlorothiazide (HYDRODIURIL) 50 MG tablet, Take 1 tablet (50 mg total) by mouth daily., Disp: 90 tablet, Rfl: 3 .  lovastatin (MEVACOR) 40 MG tablet, Take 1 tablet (40 mg total) by mouth daily., Disp: 90 tablet, Rfl: 3 .  Multiple Vitamin (MULTIVITAMINS PO), Take by mouth., Disp: , Rfl:  .  omeprazole (PRILOSEC) 40 MG capsule, Take by mouth., Disp: , Rfl:  .  vitamin C (ASCORBIC ACID) 500 MG tablet, Take by mouth., Disp: , Rfl:  .  ONETOUCH VERIO test strip, TEST ONCE DAILY, Disp: 100 each, Rfl: 4 .  tadalafil (CIALIS)  5 MG tablet, Take by mouth., Disp: , Rfl:  .  vardenafil (LEVITRA) 20 MG tablet, Take 1 tablet (20 mg total) by mouth daily as needed for erectile dysfunction. (Patient not taking: Reported on 06/03/2016), Disp: 10 tablet, Rfl: 3  Review of Systems  Constitutional: Negative.   HENT: Positive for congestion and postnasal drip.   Respiratory: Negative.   Cardiovascular: Negative.   Endocrine: Negative.     Social History    Substance Use Topics  . Smoking status: Smoker, Current Status Unknown    Packs/day: 1.00    Years: 45.00    Types: Cigarettes  . Smokeless tobacco: Never Used  . Alcohol use No   Objective:   BP 132/82 (BP Location: Right Arm, Patient Position: Sitting, Cuff Size: Large)   Pulse 72   Temp 97.8 F (36.6 C) (Oral)   Resp 16   Wt 212 lb (96.2 kg)   SpO2 97%   BMI 30.42 kg/m   Physical Exam   General Appearance:    Alert, cooperative, no distress, obese  Eyes:    PERRL, conjunctiva/corneas clear, EOM's intact       Lungs:     Clear to auscultation bilaterally, respirations unlabored  Heart:    Regular rate and rhythm  Neurologic:   Awake, alert, oriented x 3. No apparent focal neurological           defect.       Results for orders placed or performed in visit on 07/05/16  POCT glycosylated hemoglobin (Hb A1C)  Result Value Ref Range   Hemoglobin A1C 6.3    Est. average glucose Bld gHb Est-mCnc 134         Assessment & Plan:     1. Diabetes mellitus without complication ([place]) Well controlled.  Continue current medications.   - POCT glycosylated hemoglobin (Hb A1C)  2. Essential hypertension Well controlled.  Continue current medications.   - hydrochlorothiazide (HYDRODIURIL) 50 MG tablet; Take 1 tablet (50 mg total) by mouth daily.  Dispense: 90 tablet; Refill: 3  3. Need for influenza vaccination  - Flu vaccine HIGH DOSE PF  4. HLD (hyperlipidemia) He is tolerating lovastatin well with no adverse effects.   - lovastatin (MEVACOR) 40 MG tablet; Take 1 tablet (40 mg total) by mouth daily.  Dispense: 90 tablet; Refill: 3       Lelon Huh, MD  [facility]

## 2016-08-08 ENCOUNTER — Other Ambulatory Visit: Payer: Self-pay | Admitting: Family Medicine

## 2016-09-05 ENCOUNTER — Encounter: Payer: Self-pay | Admitting: Family Medicine

## 2016-09-05 ENCOUNTER — Ambulatory Visit (INDEPENDENT_AMBULATORY_CARE_PROVIDER_SITE_OTHER): Payer: Medicare Other | Admitting: Family Medicine

## 2016-09-05 VITALS — BP 130/70 | HR 87 | Temp 98.8°F | Resp 16 | Wt 215.0 lb

## 2016-09-05 DIAGNOSIS — J069 Acute upper respiratory infection, unspecified: Secondary | ICD-10-CM

## 2016-09-05 NOTE — Progress Notes (Signed)
Patient: Thomas Morgan Male    DOB: 1951-09-10   65 y.o.   MRN: [id number] Visit Date: 09/05/2016  Today's Provider: Lelon Huh, MD   Chief Complaint  Patient presents with  . Sore Throat  . Cough   Subjective:    Patient has had cough and sore throat since Saturday 09/03/2016. Patient also has symptoms of sob, wheezing chest congestion, runny nose, pain in ears. Patient has taken otc alka seltzer cold with mild relief. No fever.    Sore Throat   This is a new problem. The current episode started in the past 7 days (2 days ago). The problem has been gradually worsening. Sore throat worse side: both sides. There has been no fever. The pain is moderate. Associated symptoms include congestion, coughing, ear pain, headaches, a hoarse voice, neck pain, shortness of breath and swollen glands. Pertinent negatives include no abdominal pain, diarrhea, drooling, ear discharge, plugged ear sensation, stridor, trouble swallowing or vomiting. He has had no exposure to strep or mono. Treatments tried: alka seltzer cold. The treatment provided mild relief.       Allergies  Allergen Reactions  . Crestor  [Rosuvastatin]   . Hydrocodone-Apap-Dietary Prod     Increase heartrate  . Lescol  [Fluvastatin]     Muscle Aches     Current Outpatient Prescriptions:  .  aspirin (ASPIR-LOW) 81 MG EC tablet, Take by mouth., Disp: , Rfl:  .  hydrochlorothiazide (HYDRODIURIL) 50 MG tablet, Take 1 tablet (50 mg total) by mouth daily., Disp: 90 tablet, Rfl: 3 .  lovastatin (MEVACOR) 40 MG tablet, Take 1 tablet (40 mg total) by mouth daily., Disp: 90 tablet, Rfl: 3 .  Multiple Vitamin (MULTIVITAMINS PO), Take by mouth., Disp: , Rfl:  .  omeprazole (PRILOSEC) 20 MG capsule, TAKE 2 CAPSULES BY MOUTH EVERY DAY, Disp: 180 capsule, Rfl: 0 .  ONETOUCH VERIO test strip, TEST ONCE DAILY, Disp: 100 each, Rfl: 4 .  vitamin C (ASCORBIC ACID) 500 MG tablet, Take by mouth., Disp: , Rfl:   Review of Systems    Constitutional: Negative for appetite change, chills and fever.  HENT: Positive for congestion, ear pain, hoarse voice, postnasal drip, sinus pain, sinus pressure and sore throat. Negative for drooling, ear discharge and trouble swallowing.   Respiratory: Positive for cough, shortness of breath and wheezing. Negative for chest tightness and stridor.   Cardiovascular: Negative for chest pain and palpitations.  Gastrointestinal: Negative for abdominal pain, diarrhea, nausea and vomiting.  Musculoskeletal: Positive for neck pain.  Neurological: Positive for headaches.    Social History  Substance Use Topics  . Smoking status: Smoker, Current Status Unknown    Packs/day: 1.00    Years: 45.00    Types: Cigarettes  . Smokeless tobacco: Never Used  . Alcohol use No   Objective:   BP 130/70 (BP Location: Right Arm, Patient Position: Sitting, Cuff Size: Large)   Pulse 87   Temp 98.8 F (37.1 C) (Oral)   Resp 16   Wt 215 lb (97.5 kg)   SpO2 96%   BMI 30.85 kg/m   Physical Exam  General Appearance:    Alert, cooperative, no distress  HENT:   bilateral TM normal without fluid or infection, neck has bilateral anterior cervical nodes enlarged, pharynx erythematous without exudate, sinuses nontender, post nasal drip noted and nasal mucosa pale and congested  Eyes:    PERRL, conjunctiva/corneas clear, EOM's intact       Lungs:  Clear to auscultation bilaterally, respirations unlabored  Heart:    Regular rate and rhythm  Neurologic:   Awake, alert, oriented x 3. No apparent focal neurological           defect.           Assessment & Plan:     1. Upper respiratory tract infection, unspecified type Counseled regarding signs and symptoms of viral and bacterial respiratory infections. Advised to call or return for additional evaluation if he develops any sign of bacterial infection, or if current symptoms last longer than 10 days.         Lelon Huh, MD  [facility]

## 2016-09-05 NOTE — Patient Instructions (Signed)
Upper Respiratory Infection, Adult Most upper respiratory infections (URIs) are a viral infection of the air passages leading to the lungs. A URI affects the nose, throat, and upper air passages. The most common type of URI is nasopharyngitis and is typically referred to as "the common cold." URIs run their course and usually go away on their own. Most of the time, a URI does not require medical attention, but sometimes a bacterial infection in the upper airways can follow a viral infection. This is called a secondary infection. Sinus and middle ear infections are common types of secondary upper respiratory infections. Bacterial pneumonia can also complicate a URI. A URI can worsen asthma and chronic obstructive pulmonary disease (COPD). Sometimes, these complications can require emergency medical care and may be life threatening.  CAUSES Almost all URIs are caused by viruses. A virus is a type of germ and can spread from one person to another.  RISKS FACTORS You may be at risk for a URI if:   You smoke.   You have chronic heart or lung disease.  You have a weakened defense (immune) system.   You are very young or very old.   You have nasal allergies or asthma.  You work in crowded or poorly ventilated areas.  You work in health care facilities or schools. SIGNS AND SYMPTOMS  Symptoms typically develop 2-3 days after you come in contact with a cold virus. Most viral URIs last 7-10 days. However, viral URIs from the influenza virus (flu virus) can last 14-18 days and are typically more severe. Symptoms may include:   Runny or stuffy (congested) nose.   Sneezing.   Cough.   Sore throat.   Headache.   Fatigue.   Fever.   Loss of appetite.   Pain in your forehead, behind your eyes, and over your cheekbones (sinus pain).  Muscle aches.  DIAGNOSIS  Your health care provider may diagnose a URI by:  Physical exam.  Tests to check that your symptoms are not due to  another condition such as:  Strep throat.  Sinusitis.  Pneumonia.  Asthma. TREATMENT  A URI goes away on its own with time. It cannot be cured with medicines, but medicines may be prescribed or recommended to relieve symptoms. Medicines may help:  Reduce your fever.  Reduce your cough.  Relieve nasal congestion. HOME CARE INSTRUCTIONS   Take medicines only as directed by your health care provider.   Gargle warm saltwater or take cough drops to comfort your throat as directed by your health care provider.  Use a warm mist humidifier or inhale steam from a shower to increase air moisture. This may make it easier to breathe.  Drink enough fluid to keep your urine clear or pale yellow.   Eat soups and other clear broths and maintain good nutrition.   Rest as needed.   Return to work when your temperature has returned to normal or as your health care provider advises. You may need to stay home longer to avoid infecting others. You can also use a face mask and careful hand washing to prevent spread of the virus.  Increase the usage of your inhaler if you have asthma.   Do not use any tobacco products, including cigarettes, chewing tobacco, or electronic cigarettes. If you need help quitting, ask your health care provider. PREVENTION  The best way to protect yourself from getting a cold is to practice good hygiene.   Avoid oral or hand contact with people with cold   symptoms.   Wash your hands often if contact occurs.  There is no clear evidence that vitamin C, vitamin E, echinacea, or exercise reduces the chance of developing a cold. However, it is always recommended to get plenty of rest, exercise, and practice good nutrition.  SEEK MEDICAL CARE IF:   You are getting worse rather than better.   Your symptoms are not controlled by medicine.   You have chills.  You have worsening shortness of breath.  You have brown or red mucus.  You have yellow or brown nasal  discharge.  You have pain in your face, especially when you bend forward.  You have a fever.  You have swollen neck glands.  You have pain while swallowing.  You have white areas in the back of your throat. SEEK IMMEDIATE MEDICAL CARE IF:   You have severe or persistent:  Headache.  Ear pain.  Sinus pain.  Chest pain.  You have chronic lung disease and any of the following:  Wheezing.  Prolonged cough.  Coughing up blood.  A change in your usual mucus.  You have a stiff neck.  You have changes in your:  Vision.  Hearing.  Thinking.  Mood. MAKE SURE YOU:   Understand these instructions.  Will watch your condition.  Will get help right away if you are not doing well or get worse.   This information is not intended to replace advice given to you by your health care provider. Make sure you discuss any questions you have with your health care provider.   Document Released: 04/05/2001 Document Revised: 02/24/2015 Document Reviewed: 01/15/2014 Elsevier Interactive Patient Education 2016 [employer]  

## 2016-09-26 ENCOUNTER — Telehealth: Payer: Self-pay | Admitting: *Deleted

## 2016-09-26 NOTE — Telephone Encounter (Signed)
Notified patient that annual lung cancer screening low dose CT scan is due. Confirmed that patient is within the age range of 55-77, and asymptomatic, (no signs or symptoms of lung cancer). Patient denies illness that would prevent curative treatment for lung cancer if found. The patient is a current smoker, with a 46 pack year history. The shared decision making visit was done 10/23/15. Patient is agreeable for CT scan being scheduled. Information sent to business office.

## 2016-10-20 ENCOUNTER — Other Ambulatory Visit: Payer: Self-pay | Admitting: *Deleted

## 2016-10-20 DIAGNOSIS — Z87891 Personal history of nicotine dependence: Secondary | ICD-10-CM

## 2016-10-27 ENCOUNTER — Telehealth: Payer: Self-pay | Admitting: Family Medicine

## 2016-10-27 NOTE — Telephone Encounter (Signed)
Called Pt to schedule AWV with [facility] & CPE W/ PCP feb 6th - knb

## 2016-11-04 ENCOUNTER — Ambulatory Visit
Admission: RE | Admit: 2016-11-04 | Discharge: 2016-11-04 | Disposition: A | Discharge status: Home / Self Care | Payer: Medicare Other | Source: Ambulatory Visit | Attending: Oncology | Admitting: Oncology

## 2016-11-04 DIAGNOSIS — F17210 Nicotine dependence, cigarettes, uncomplicated: Secondary | ICD-10-CM | POA: Insufficient documentation

## 2016-11-04 DIAGNOSIS — I2510 Atherosclerotic heart disease of native coronary artery without angina pectoris: Secondary | ICD-10-CM | POA: Diagnosis not present

## 2016-11-04 DIAGNOSIS — I700 Atherosclerosis of aorta: Secondary | ICD-10-CM | POA: Insufficient documentation

## 2016-11-04 DIAGNOSIS — Z87891 Personal history of nicotine dependence: Secondary | ICD-10-CM

## 2016-11-14 ENCOUNTER — Encounter: Payer: Self-pay | Admitting: Family Medicine

## 2016-11-14 ENCOUNTER — Encounter: Payer: Self-pay | Admitting: *Deleted

## 2016-11-14 DIAGNOSIS — I700 Atherosclerosis of aorta: Secondary | ICD-10-CM | POA: Insufficient documentation

## 2016-11-25 ENCOUNTER — Telehealth: Payer: Self-pay | Admitting: *Deleted

## 2016-11-25 ENCOUNTER — Encounter: Payer: Self-pay | Admitting: Family Medicine

## 2016-11-25 NOTE — Telephone Encounter (Signed)
Notified patient of LDCT lung cancer screening results with recommendation for 12 month follow up imaging. Also notified of incidental finding noted below and encouraged to discuss with PCP for evaluation of medical management changes or other testing. Patient verbalizes understanding. This note will be forwarded to PCP via Epic.  IMPRESSION: 1. Lung-RADS Category 2-S, benign appearance or behavior. Continue annual screening with low-dose chest CT without contrast in 12 months. 2. The "S" modifier above refers to potentially clinically significant non lung cancer related findings. 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 3. Aortic atherosclerosis.

## 2016-12-04 ENCOUNTER — Other Ambulatory Visit: Payer: Self-pay | Admitting: Family Medicine

## 2016-12-12 ENCOUNTER — Ambulatory Visit (INDEPENDENT_AMBULATORY_CARE_PROVIDER_SITE_OTHER): Payer: Medicare Other | Admitting: Family Medicine

## 2016-12-12 ENCOUNTER — Encounter: Payer: Self-pay | Admitting: Family Medicine

## 2016-12-12 VITALS — BP 150/80 | HR 70 | Temp 98.5°F | Resp 16 | Ht 71.0 in | Wt 211.0 lb

## 2016-12-12 DIAGNOSIS — I739 Peripheral vascular disease, unspecified: Secondary | ICD-10-CM | POA: Diagnosis not present

## 2016-12-12 DIAGNOSIS — Z125 Encounter for screening for malignant neoplasm of prostate: Secondary | ICD-10-CM | POA: Diagnosis not present

## 2016-12-12 DIAGNOSIS — Z23 Encounter for immunization: Secondary | ICD-10-CM | POA: Diagnosis not present

## 2016-12-12 DIAGNOSIS — E1159 Type 2 diabetes mellitus with other circulatory complications: Secondary | ICD-10-CM | POA: Diagnosis not present

## 2016-12-12 DIAGNOSIS — Z0000 Encounter for general adult medical examination without abnormal findings: Secondary | ICD-10-CM | POA: Diagnosis not present

## 2016-12-12 DIAGNOSIS — I2510 Atherosclerotic heart disease of native coronary artery without angina pectoris: Secondary | ICD-10-CM | POA: Insufficient documentation

## 2016-12-12 DIAGNOSIS — I10 Essential (primary) hypertension: Secondary | ICD-10-CM

## 2016-12-12 LAB — POCT UA - MICROALBUMIN: MICROALBUMIN (UR) POC: 20 mg/L

## 2016-12-12 NOTE — Progress Notes (Signed)
Patient: Thomas Morgan, Male    DOB: 12/05/1950, 66 y.o.   MRN: [id number] Visit Date: 12/12/2016  Today's Provider: Lelon Huh, MD   Chief Complaint  Patient presents with  . Annual Exam  . Diabetes  . Hypertension  . Hyperlipidemia   Subjective:    Annual physical Thomas Morgan is a 66 y.o. male. He feels well. He reports exercising yes. He reports he is sleeping fairly well.  -----------------------------------------------------------    Diabetes Mellitus Type II, Follow-up:   Lab Results  Component Value Date   HGBA1C 6.3 07/05/2016   HGBA1C 7.4 (H) 10/08/2015   HGBA1C 6.4 04/30/2015   Last seen for diabetes 5 months ago.  Management since then includes; no changes. He reports good compliance with treatment. He is not having side effects. none Current symptoms include none and have been unchanged. Home blood sugar records: fasting range: 129-131  Episodes of hypoglycemia? no   Current Insulin Regimen: n/a Most Recent Eye Exam: none Weight trend: stable Prior visit with dietician: no Current diet: well balanced Current exercise: walking  ----------------------------------------------------------------   Hypertension, follow-up:  BP Readings from Last 3 Encounters:  12/12/16 (!) 150/80  09/05/16 130/70  07/05/16 132/82    He was last seen for hypertension 5 months ago.  BP at that visit was 132/82. Management since that visit includes;no changes .He reports good compliance with treatment. He is not having side effects. none He is exercising. He is adherent to low salt diet.   Outside blood pressures are 130/80. He is experiencing none.  Patient denies none.   Cardiovascular risk factors include diabetes mellitus.  Use of agents associated with hypertension: none.   ----------------------------------------------------------------    Lipid/Cholesterol, Follow-up:   Last seen for this 5 months ago.  Management since that visit  includes;no changes .  Last Lipid Panel:    Component Value Date/Time   CHOL 183 05/08/2015 1120   TRIG 180 (H) 05/08/2015 1120   HDL 30 (L) 05/08/2015 1120   CHOLHDL 6.1 (H) 05/08/2015 1120   LDLCALC 117 (H) 05/08/2015 1120    He reports good compliance with treatment. He is not having side effects. none  Wt Readings from Last 3 Encounters:  12/12/16 211 lb (95.7 kg)  11/04/16 207 lb (93.9 kg)  09/05/16 215 lb (97.5 kg)    ----------------------------------------------------------------  Of note is reduced ABI (moreso on right) on ABI done by home wellness visit in October. Was also noted to have multi-vessal coronary atherosclerosis on LDCT done last month. He does admit burning and stinging In feet, but moreso on left.   Review of Systems  Constitutional: Negative.   HENT: Positive for congestion and sinus pressure.   Eyes: Negative.   Respiratory: Negative.   Cardiovascular: Negative.   Gastrointestinal: Negative.   Endocrine: Positive for polyuria.  Musculoskeletal: Negative.   Skin: Negative.   Allergic/Immunologic: Positive for environmental allergies.  Neurological: Negative.   Hematological: Negative.   Psychiatric/Behavioral: Negative.     Social History   Social History  . Marital status: Married    Spouse name: N/A  . Number of children: N/A  . Years of education: N/A   Occupational History  . Not on file.   Social History Main Topics  . Smoking status: Smoker, Current Status Unknown    Packs/day: 1.00    Years: 45.00    Types: Cigarettes  . Smokeless tobacco: Never Used  . Alcohol use No  . Drug use:  No  . Sexual activity: Not on file   Other Topics Concern  . Not on file   Social History Narrative  . No narrative on file    Past Medical History:  Diagnosis Date  . Personal history of tobacco use, presenting hazards to health 10/22/2015     Patient Active Problem List   Diagnosis Date Noted  . Atherosclerosis of aorta ([place])  11/14/2016  . Tobacco abuse 10/08/2015  . Numbness and tingling of foot 10/08/2015  . Allergic rhinitis 04/24/2015  . Back pain, chronic 04/24/2015  . Diabetes ([place]) 04/24/2015  . 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 GERD (gastroesophageal reflux disease) 04/24/2015  . Hypertension 04/24/2015  . Obesity 04/24/2015  . Disorder of male genital organs 04/24/2015  . Tubular adenoma 04/24/2015  . 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 Chronic cough 10/03/2011  . Ventral hernia 05/25/2010  . Hyperlipidemia 10/12/2009  . Arthralgia of multiple joints 10/12/2009  . Hypersomnia 08/22/2008  . Psoriasis 08/22/2008  . Hypersteatosis 06/06/2007  . 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 Tobacco abuse 10/24/1968    Past Surgical History:  Procedure Laterality Date  . WRIST SURGERY Right    Trauma resulting in prosthetics in the right wrist and elbow    His family history includes AAA (abdominal aortic aneurysm) in his father; Cancer in his mother; Heart Problems in his father; Heart attack in his brother; Hypercholesterolemia in his brother; Hypertension in his mother.      Current Outpatient Prescriptions:  .  aspirin (ASPIR-LOW) 81 MG EC tablet, Take by mouth., Disp: , Rfl:  .  hydrochlorothiazide (HYDRODIURIL) 50 MG tablet, Take 1 tablet (50 mg total) by mouth daily., Disp: 90 tablet, Rfl: 3 .  lovastatin (MEVACOR) 40 MG tablet, Take 1 tablet (40 mg total) by mouth daily., Disp: 90 tablet, Rfl: 3 .  Multiple Vitamin (MULTIVITAMINS PO), Take by mouth., Disp: , Rfl:  .  omeprazole (PRILOSEC) 20 MG capsule, TAKE 2 CAPSULES BY MOUTH EVERY DAY, Disp: 180 capsule, Rfl: 4 .  ONETOUCH VERIO test strip, TEST ONCE DAILY, Disp: 100 each, Rfl: 4 .  vitamin C (ASCORBIC ACID) 500 MG tablet, Take by mouth., Disp: , Rfl:   Patient Care Team: Birdie Sons, MD as PCP - General (Family Medicine)     Objective:   Vitals: BP (!) 150/80 (BP Location: Left Arm, Patient Position:  Sitting, Cuff Size: Large)   Pulse 70   Temp 98.5 F (36.9 C) (Oral)   Resp 16   Ht 5\' 11"  (1.803 m)   Wt 211 lb (95.7 kg)   SpO2 98%   BMI 29.43 kg/m   Physical Exam  Activities of Daily Living In your present state of health, do you have any difficulty performing the following activities: 12/12/2016 07/05/2016  Hearing? N N  Vision? N N  Difficulty concentrating or making decisions? N N  Walking or climbing stairs? N N  Dressing or bathing? N N  Doing errands, shopping? N N  Some recent data might be hidden    Fall Risk Assessment Fall Risk  12/12/2016 10/08/2015  Falls in the past year? No No     Depression Screen PHQ 2/9 Scores 12/12/2016 10/08/2015  PHQ - 2 Score 0 0  PHQ- 9 Score 1 2    Cognitive Testing - 6-CIT  Correct? Score   What year is it? yes 0 0 or 4  What month is it? yes 0 0 or 3  Memorize:    Jenny Reichmann,  Tamala Julian,  88,  [place],      What time is it? (within 1 hour) yes 0 0 or 3  Count backwards from 20 yes 0 0, 2, or 4  Name the months of the year yes 0 0, 2, or 4  Repeat name & address above yes 0 0, 2, 4, 6, 8, or 10       TOTAL SCORE  3/28   Interpretation:  Normal  Normal (0-7) Abnormal (8-28)    Audit-C Alcohol Use Screening  Question Answer Points  How often do you have alcoholic drink? never 0  On days you do drink alcohol, how many drinks do you typically consume? 0 0  How oftey will you drink 6 or more in a total? never 0  Total Score:  0   A score of 3 or more in women, and 4 or more in men indicates increased risk for alcohol abuse, EXCEPT if all of the points are from question 1.     Assessment & Plan:    Annual physical Reviewed patient's Family Medical History Reviewed and updated list of patient's medical providers Assessment of cognitive impairment was done Assessed patient's functional ability Established a written schedule for health screening [place] Completed and Reviewed  Exercise  Activities and Dietary recommendations Goals    None      Immunization History  Administered Date(s) Administered  . Influenza, High Dose Seasonal PF 10/08/2015, 07/05/2016  . Pneumococcal Conjugate-13 10/08/2015  . Pneumococcal Polysaccharide-23 09/10/2008  . Td 10/24/1997  . Tdap 06/06/2007    Health Maintenance  Topic Date Due  . OPHTHALMOLOGY EXAM  06/07/1960  . URINE MICROALBUMIN  06/07/1960  . ZOSTAVAX  06/07/2010  . FOOT EXAM  10/07/2016  . PNA vac Low Risk Adult (2 of 2 - PPSV23) 10/07/2016  . HEMOGLOBIN A1C  01/02/2017  . TETANUS/TDAP  06/05/2017  . COLONOSCOPY  12/20/2019  . INFLUENZA VACCINE  Completed  . Hepatitis C Screening  Completed     Discussed health benefits of physical activity, and encouraged him to engage in regular exercise appropriate for his age and condition.    --------------------------------------------------------------------------   1. Annual physical exam   2. Essential hypertension Well controlled.  Continue current medications.    3. Type 2 diabetes mellitus with other circulatory complication, without long-term current use of insulin (HCC) Diet controlled.  - Comprehensive metabolic panel - Lipid panel - Hemoglobin A1c - POCT UA - Microalbumin  4. Need for pneumococcal vaccination  - Pneumococcal polysaccharide vaccine 23-valent greater than or equal to 2yo subcutaneous/IM  5. Prostate cancer screening  - PSA  6. Intermittent claudication (HCC)  - Ankle Brachial Index Assessment (BFP)  7. Coronary arteriosclerosis Asymptomatic. On ECASA. Incidental finding on LDCT. Consider change to high intensity statin after reviewing labs. Discussed possible cardiology work up. Will see how ABIs look.    Lelon Huh, MD  [facility]

## 2016-12-13 ENCOUNTER — Telehealth: Payer: Self-pay

## 2016-12-13 LAB — COMPREHENSIVE METABOLIC PANEL
ALT: 16 IU/L (ref 0–44)
AST: 15 IU/L (ref 0–40)
Albumin/Globulin Ratio: 1.9 (ref 1.2–2.2)
Albumin: 4.6 g/dL (ref 3.6–4.8)
Alkaline Phosphatase: 92 IU/L (ref 39–117)
BUN/Creatinine Ratio: 18 (ref 10–24)
BUN: 14 mg/dL (ref 8–27)
Bilirubin Total: 0.5 mg/dL (ref 0.0–1.2)
CALCIUM: 9.5 mg/dL (ref 8.6–10.2)
CO2: 21 mmol/L (ref 18–29)
CREATININE: 0.8 mg/dL (ref 0.76–1.27)
Chloride: 97 mmol/L (ref 96–106)
GFR calc Af Amer: 108 mL/min/{1.73_m2} (ref >59)
GFR, EST NON AFRICAN AMERICAN: 93 mL/min/{1.73_m2} (ref >59)
GLOBULIN, TOTAL: 2.4 g/dL (ref 1.5–4.5)
GLUCOSE: 104 mg/dL — AB (ref 65–99)
Potassium: 3.9 mmol/L (ref 3.5–5.2)
Sodium: 143 mmol/L (ref 134–144)
Total Protein: 7 g/dL (ref 6.0–8.5)

## 2016-12-13 LAB — LIPID PANEL
CHOLESTEROL TOTAL: 194 mg/dL (ref 100–199)
Chol/HDL Ratio: 6.3 ratio units — ABNORMAL HIGH (ref 0.0–5.0)
HDL: 31 mg/dL — AB (ref >39)
LDL CALC: 123 mg/dL — AB (ref 0–99)
TRIGLYCERIDES: 200 mg/dL — AB (ref 0–149)
VLDL CHOLESTEROL CAL: 40 mg/dL (ref 5–40)

## 2016-12-13 LAB — PSA: PROSTATE SPECIFIC AG, SERUM: 0.5 ng/mL (ref 0.0–4.0)

## 2016-12-13 LAB — HEMOGLOBIN A1C
Est. average glucose Bld gHb Est-mCnc: 137 mg/dL
Hgb A1c MFr Bld: 6.4 % — ABNORMAL HIGH (ref 4.8–5.6)

## 2016-12-13 NOTE — Telephone Encounter (Signed)
Advised patient as below. Patient reports that he can not take rosuvastatin due to severe muscle aches. He also wanted to let you know that he has only been taking lovastatin every other day instead of daily. Patient wants to know if he starts back taking the lovastatin as directed, would this bring his LDL closer to 70? Please advise. Thanks!

## 2016-12-13 NOTE — Telephone Encounter (Signed)
He can try taking lovastatin every single day. Can also take OTC coenzyme Q10 200 mg daily which helps prevent side effects of statin and is good for heart.

## 2016-12-13 NOTE — Telephone Encounter (Signed)
-----   Message from Birdie Sons, MD sent at 12/13/2016  8:02 AM EST ----- LDL cholesterol is 123, but needs to be under 70 due to atherosclerosis that was seen on CT. Need to d/c lovastatin ant start rosuvastatin 20mg  once a day, #30, rf x 3. Schedule follow up o.v. For cholesterol and diabetes in 3 months. Call if any problems with medication.

## 2016-12-13 NOTE — Telephone Encounter (Signed)
Advised  ED 

## 2016-12-21 ENCOUNTER — Ambulatory Visit (INDEPENDENT_AMBULATORY_CARE_PROVIDER_SITE_OTHER): Payer: Medicare Other

## 2016-12-21 DIAGNOSIS — R202 Paresthesia of skin: Secondary | ICD-10-CM

## 2016-12-21 DIAGNOSIS — R200 Anesthesia of skin: Secondary | ICD-10-CM | POA: Diagnosis not present

## 2017-03-07 ENCOUNTER — Other Ambulatory Visit: Payer: Self-pay | Admitting: Podiatry

## 2017-03-07 ENCOUNTER — Telehealth: Payer: Self-pay | Admitting: Family Medicine

## 2017-03-07 NOTE — Telephone Encounter (Signed)
Patient made appointment for 03/10/2017 at 4pm.

## 2017-03-07 NOTE — Telephone Encounter (Signed)
LMOVM for pt to return call. Patient needs to schedule a pre-op appt.

## 2017-03-10 ENCOUNTER — Encounter: Payer: Self-pay | Admitting: Family Medicine

## 2017-03-10 ENCOUNTER — Ambulatory Visit (INDEPENDENT_AMBULATORY_CARE_PROVIDER_SITE_OTHER): Payer: Medicare Other | Admitting: Family Medicine

## 2017-03-10 VITALS — BP 128/68 | HR 81 | Temp 98.7°F | Resp 16 | Ht 71.0 in | Wt 206.0 lb

## 2017-03-10 DIAGNOSIS — R0989 Other specified symptoms and signs involving the circulatory and respiratory systems: Secondary | ICD-10-CM | POA: Diagnosis not present

## 2017-03-10 DIAGNOSIS — Z01818 Encounter for other preprocedural examination: Secondary | ICD-10-CM | POA: Diagnosis not present

## 2017-03-10 NOTE — Progress Notes (Signed)
Patient: Thomas Morgan Male    DOB: 1950-12-08   66 y.o.   MRN: [id number] Visit Date: 03/10/2017  Today's Provider: Lelon Huh, MD   Chief Complaint  Patient presents with  . Pre-op Exam   Subjective:    Patient is here for pre-op exam. Scheduled for surgery on 03/24/2017, by Dr. Sherren Mocha cline. Surgery-Excision Morton's Neuroma.   he feels well, No chest pain, palpitations or dyspnea. Is scheduled for anesthesia pre-op on 03/16/2017. Is still smoking but trying to quit Has had trouble with nausea from anesthesia in the past but no other adverse effects of anesthesia or surgical complications.   Allergies  Allergen Reactions  . Crestor  [Rosuvastatin]   . Hydrocodone-Apap-Dietary Prod     Increase heartrate  . Lescol  [Fluvastatin]     Muscle Aches     Current Outpatient Prescriptions:  .  aspirin (ASPIR-LOW) 81 MG EC tablet, Take by mouth., Disp: , Rfl:  .  hydrochlorothiazide (HYDRODIURIL) 50 MG tablet, Take 1 tablet (50 mg total) by mouth daily., Disp: 90 tablet, Rfl: 3 .  lovastatin (MEVACOR) 40 MG tablet, Take 1 tablet (40 mg total) by mouth daily., Disp: 90 tablet, Rfl: 3 .  Multiple Vitamin (MULTIVITAMINS PO), Take by mouth., Disp: , Rfl:  .  omeprazole (PRILOSEC) 20 MG capsule, TAKE 2 CAPSULES BY MOUTH EVERY DAY, Disp: 180 capsule, Rfl: 4 .  ONETOUCH VERIO test strip, TEST ONCE DAILY, Disp: 100 each, Rfl: 4 .  vitamin C (ASCORBIC ACID) 500 MG tablet, Take by mouth., Disp: , Rfl:   Review of Systems  Constitutional: Negative for appetite change, chills and fever.  Respiratory: Negative for chest tightness, shortness of breath and wheezing.   Cardiovascular: Negative for chest pain and palpitations.  Gastrointestinal: Negative for abdominal pain, nausea and vomiting.    Social History  Substance Use Topics  . Smoking status: Smoker, Current Status Unknown    Packs/day: 1.00    Years: 45.00    Types: Cigarettes  . Smokeless tobacco: Never Used  . Alcohol use  No   Objective:   BP 128/68 (BP Location: Right Arm, Patient Position: Sitting, Cuff Size: Large)   Pulse 81   Temp 98.7 F (37.1 C) (Oral)   Resp 16   Ht 5\' 11"  (1.803 m)   Wt 206 lb (93.4 kg)   SpO2 96%   BMI 28.73 kg/m  Vitals:   03/10/17 1605  BP: 128/68  Pulse: 81  Resp: 16  Temp: 98.7 F (37.1 C)  TempSrc: Oral  SpO2: 96%  Weight: 206 lb (93.4 kg)  Height: 5\' 11"  (1.803 m)     Physical Exam   General Appearance:    Alert, cooperative, no distress, appears stated age  Head:    Normocephalic, without obvious abnormality, atraumatic  Eyes:    PERRL, conjunctiva/corneas clear, EOM's intact, fundi    benign, both eyes       Ears:    Normal TM's and external ear canals, both ears  Nose:   Nares normal, septum midline, mucosa normal, no drainage   or sinus tenderness  Throat:   Lips, mucosa, and tongue normal; teeth and gums normal  Neck:   Supple, symmetrical, trachea midline, no adenopathy;       thyroid:  No enlargement/tenderness/nodules; no carotid   bruit or JVD  Back:     Symmetric, no curvature, ROM normal, no CVA tenderness  Lungs:     Clear to auscultation bilaterally,  respirations unlabored  Chest wall:    No tenderness or deformity  Heart:    Regular rate and rhythm, S1 and S2 normal, no murmur, rub   or gallop  Abdomen:     Soft, non-tender, bowel sounds active all four quadrants,    Abdominal bruit above umbilicus and palpable aorta. No other masses.   Genitalia:    deferred  Rectal:    deferred  Extremities:   Extremities normal, atraumatic, no cyanosis or edema  Pulses:   2+ and symmetric all extremities  Skin:   Skin color, texture, turgor normal, no rashes or lesions  Lymph nodes:   Cervical, supraclavicular, and axillary nodes normal  Neurologic:   CNII-XII intact. Normal strength, sensation and reflexes      throughout       Assessment & Plan:     1. Preop examination  - EKG 12-Lead  2. Abdominal bruit -Aorta ultrasound   3.2cm  abdominal aneurysm note on ultrasound 03/17/2017. Not a relative or absolute contraindication for surgery. Will repeat surveillance ultrasound in 3 years. Patient also noted to be hypokalemia with potassium =3.0 on pre-op labs 03-17-2017 and started on 47mEQ potassium chloride. He is low risk for surgery and optimized for surgery.Is to recheck potasium on 03/22/2017. Pre-op clearance faxed.        Lelon Huh, MD  [facility]

## 2017-03-14 ENCOUNTER — Telehealth: Payer: Self-pay | Admitting: Family Medicine

## 2017-03-14 ENCOUNTER — Other Ambulatory Visit: Payer: Self-pay | Admitting: Family Medicine

## 2017-03-14 DIAGNOSIS — R0989 Other specified symptoms and signs involving the circulatory and respiratory systems: Secondary | ICD-10-CM

## 2017-03-14 NOTE — Telephone Encounter (Signed)
Please review-aa 

## 2017-03-14 NOTE — Telephone Encounter (Signed)
Per scheduling department at [facility] they do not preform ultrasound Aorta.Order must be put in as retroperieneal or AAA screening if there is no known AAA

## 2017-03-14 NOTE — Telephone Encounter (Signed)
He has an abdominal bruit and a palpable aorta. Please order whatever he needs to rule out AAA (not a routine screening)

## 2017-03-16 ENCOUNTER — Encounter
Admission: RE | Admit: 2017-03-16 | Discharge: 2017-03-16 | Disposition: A | Discharge status: Home / Self Care | Payer: Medicare Other | Source: Ambulatory Visit | Attending: Podiatry | Admitting: Podiatry

## 2017-03-16 ENCOUNTER — Telehealth: Payer: Self-pay

## 2017-03-16 DIAGNOSIS — R0989 Other specified symptoms and signs involving the circulatory and respiratory systems: Secondary | ICD-10-CM | POA: Diagnosis present

## 2017-03-16 DIAGNOSIS — E876 Hypokalemia: Secondary | ICD-10-CM

## 2017-03-16 DIAGNOSIS — I77811 Abdominal aortic ectasia: Secondary | ICD-10-CM | POA: Diagnosis not present

## 2017-03-16 DIAGNOSIS — I728 Aneurysm of other specified arteries: Secondary | ICD-10-CM | POA: Diagnosis not present

## 2017-03-16 HISTORY — DX: Prediabetes: R73.03

## 2017-03-16 HISTORY — DX: Malignant (primary) neoplasm, unspecified: C80.1

## 2017-03-16 HISTORY — DX: Pure hypercholesterolemia, unspecified: E78.00

## 2017-03-16 HISTORY — DX: Other specified postprocedural states: R11.2

## 2017-03-16 HISTORY — DX: Adverse effect of unspecified anesthetic, initial encounter: T41.45XA

## 2017-03-16 HISTORY — DX: Other complications of anesthesia, initial encounter: T88.59XA

## 2017-03-16 HISTORY — DX: Gastro-esophageal reflux disease without esophagitis: K21.9

## 2017-03-16 HISTORY — DX: Other specified postprocedural states: Z98.890

## 2017-03-16 HISTORY — DX: Nausea with vomiting, unspecified: R11.2

## 2017-03-16 LAB — BASIC METABOLIC PANEL
ANION GAP: 8 (ref 5–15)
BUN: 17 mg/dL (ref 6–20)
CHLORIDE: 101 mmol/L (ref 101–111)
CO2: 31 mmol/L (ref 22–32)
Calcium: 9.3 mg/dL (ref 8.9–10.3)
Creatinine, Ser: 0.69 mg/dL (ref 0.61–1.24)
Glucose, Bld: 98 mg/dL (ref 65–99)
POTASSIUM: 3 mmol/L — AB (ref 3.5–5.1)
SODIUM: 140 mmol/L (ref 135–145)

## 2017-03-16 LAB — HEMOGLOBIN: HEMOGLOBIN: 15.1 g/dL (ref 13.0–18.0)

## 2017-03-16 MED ORDER — POTASSIUM CHLORIDE ER 10 MEQ PO TBCR: 40.0000 meq | EXTENDED_RELEASE_TABLET | Freq: Every day | ORAL | 0 refills | Status: DC

## 2017-03-16 NOTE — Patient Instructions (Signed)
Your procedure is scheduled on: Friday 03/24/17 Report to [place]. 2ND FLOOR MEDICAL MALL ENTRANCE. To find out your arrival time please call [phone] between 1PM - 3PM on Thursday 03/23/17.  Remember: Instructions that are not followed completely may result in serious medical risk, up to and including death, or upon the discretion of your surgeon and anesthesiologist your surgery may need to be rescheduled.    __X__ 1. Do not eat food or drink liquids after midnight. No gum chewing or hard candies.     __X__ 2. No Alcohol for 24 hours before or after surgery.   ____ 3. Bring all medications with you on the day of surgery if instructed.    __X__ 4. Notify your doctor if there is any change in your medical condition     (cold, fever, infections).             ___X__5. No smoking within 24 hours of your surgery.     Do not wear jewelry, make-up, hairpins, clips or nail polish.  Do not wear lotions, powders, or perfumes.   Do not shave 48 hours prior to surgery. Men may shave face and neck.  Do not bring valuables to the hospital.    [facility] is not responsible for any belongings or valuables.               Contacts, dentures or bridgework may not be worn into surgery.  Leave your suitcase in the car. After surgery it may be brought to your room.  For patients admitted to the hospital, discharge time is determined by your                treatment team.   Patients discharged the day of surgery will not be allowed to drive home.   Please read over the following fact sheets that you were given:   Pain Booklet and MRSA Information   __X__ Take these medicines the morning of surgery with A SIP OF WATER:    1. LOVASTATIN  2. OMEPRAZOLE  3.   4.  5.  6.  ____ Fleet Enema (as directed)   __X__ Use CHG Soap as directed  ____ Use inhalers on the day of surgery  ____ Stop metformin 2 days prior to surgery    ____ Take 1/2 of usual insulin dose the night before surgery and  none on the morning of surgery.   __X__ Stop Coumadin/Plavix/aspirin on DATE AS INSTRUCTED BY YOUR PHYSICIAN  __X__ Stop Anti-inflammatories such as Advil, Aleve, Ibuprofen, Motrin, Naproxen, Naprosyn, Goodies,powder, or aspirin products.  OK to take Tylenol.   __X__ Stop supplements until after surgery.    ____ Bring C-Pap to the hospital.

## 2017-03-16 NOTE — Pre-Procedure Instructions (Signed)
Notified Cyndy at Dr Sammuel Bailiff office (via fax/phone) pt has 3.0 K+ and will need supplement. Understatnding verbalized.

## 2017-03-16 NOTE — Telephone Encounter (Signed)
Kim from Dr. Danie Chandler office (podiatry) called stating that they did pre-op labs on patient, and his potassium level was 3. She states patient needs to be treated. I consulted with Dr. Rosanna Randy who recommends that patient start KCL 17meq 4 pills daily #100. Recheck potassium levels on Tuesday 03/21/2017. Patient was advised and agrees with treatment plan. Prescription sent into pharmacy.  Order for lab placed and left up front for pick up.

## 2017-03-17 ENCOUNTER — Encounter: Payer: Self-pay | Admitting: Family Medicine

## 2017-03-17 ENCOUNTER — Ambulatory Visit
Admission: RE | Admit: 2017-03-17 | Discharge: 2017-03-17 | Disposition: A | Discharge status: Home / Self Care | Payer: Medicare Other | Source: Ambulatory Visit | Attending: Family Medicine | Admitting: Family Medicine

## 2017-03-17 DIAGNOSIS — I728 Aneurysm of other specified arteries: Secondary | ICD-10-CM | POA: Insufficient documentation

## 2017-03-17 DIAGNOSIS — I77811 Abdominal aortic ectasia: Secondary | ICD-10-CM | POA: Insufficient documentation

## 2017-03-17 DIAGNOSIS — R0989 Other specified symptoms and signs involving the circulatory and respiratory systems: Secondary | ICD-10-CM

## 2017-03-17 DIAGNOSIS — I719 Aortic aneurysm of unspecified site, without rupture: Secondary | ICD-10-CM | POA: Insufficient documentation

## 2017-03-17 NOTE — Telephone Encounter (Signed)
Pt advised. Pt will check with the pharmacy-aa

## 2017-03-17 NOTE — Telephone Encounter (Signed)
Lmtcb, I called [facility] to try and speak to them to make sure they have the RX that was sent in on 03/16/17 but I kept getting transferred to [place]

## 2017-03-17 NOTE — Telephone Encounter (Signed)
Pt called saying the pharmacy has not recd the order.  Please advise  [phone]  Thanks, Con Memos

## 2017-03-21 ENCOUNTER — Other Ambulatory Visit
Admission: RE | Admit: 2017-03-21 | Discharge: 2017-03-21 | Disposition: A | Discharge status: Home / Self Care | Payer: Medicare Other | Source: Ambulatory Visit | Attending: Family Medicine | Admitting: Family Medicine

## 2017-03-21 DIAGNOSIS — G5762 Lesion of plantar nerve, left lower limb: Secondary | ICD-10-CM | POA: Diagnosis present

## 2017-03-21 LAB — POTASSIUM: POTASSIUM: 3.9 mmol/L (ref 3.5–5.1)

## 2017-03-23 MED ORDER — CEFAZOLIN SODIUM-DEXTROSE 2-4 GM/100ML-% IV SOLN
2.0000 g | INTRAVENOUS | Status: AC
  Administered 2017-03-24: 2 g via INTRAVENOUS

## 2017-03-24 ENCOUNTER — Ambulatory Visit: Payer: Medicare Other | Admitting: Anesthesiology

## 2017-03-24 ENCOUNTER — Encounter
Admission: RE | Disposition: A | Discharge status: Home / Self Care | Payer: Self-pay | Source: Ambulatory Visit | Attending: Podiatry

## 2017-03-24 ENCOUNTER — Ambulatory Visit
Admission: RE | Admit: 2017-03-24 | Discharge: 2017-03-24 | Disposition: A | Discharge status: Home / Self Care | Payer: Medicare Other | Source: Ambulatory Visit | Attending: Podiatry | Admitting: Podiatry

## 2017-03-24 ENCOUNTER — Encounter: Payer: Self-pay | Admitting: *Deleted

## 2017-03-24 DIAGNOSIS — E7800 Pure hypercholesterolemia, unspecified: Secondary | ICD-10-CM | POA: Insufficient documentation

## 2017-03-24 DIAGNOSIS — J219 Acute bronchiolitis, unspecified: Secondary | ICD-10-CM | POA: Insufficient documentation

## 2017-03-24 DIAGNOSIS — Z885 Allergy status to narcotic agent status: Secondary | ICD-10-CM | POA: Diagnosis not present

## 2017-03-24 DIAGNOSIS — Z79899 Other long term (current) drug therapy: Secondary | ICD-10-CM | POA: Insufficient documentation

## 2017-03-24 DIAGNOSIS — G5762 Lesion of plantar nerve, left lower limb: Secondary | ICD-10-CM | POA: Diagnosis present

## 2017-03-24 DIAGNOSIS — K219 Gastro-esophageal reflux disease without esophagitis: Secondary | ICD-10-CM | POA: Insufficient documentation

## 2017-03-24 DIAGNOSIS — I2510 Atherosclerotic heart disease of native coronary artery without angina pectoris: Secondary | ICD-10-CM | POA: Insufficient documentation

## 2017-03-24 DIAGNOSIS — Z87891 Personal history of nicotine dependence: Secondary | ICD-10-CM | POA: Insufficient documentation

## 2017-03-24 DIAGNOSIS — Z85828 Personal history of other malignant neoplasm of skin: Secondary | ICD-10-CM | POA: Insufficient documentation

## 2017-03-24 DIAGNOSIS — E1151 Type 2 diabetes mellitus with diabetic peripheral angiopathy without gangrene: Secondary | ICD-10-CM | POA: Diagnosis not present

## 2017-03-24 DIAGNOSIS — Z7982 Long term (current) use of aspirin: Secondary | ICD-10-CM | POA: Insufficient documentation

## 2017-03-24 DIAGNOSIS — Z888 Allergy status to other drugs, medicaments and biological substances status: Secondary | ICD-10-CM | POA: Diagnosis not present

## 2017-03-24 DIAGNOSIS — I10 Essential (primary) hypertension: Secondary | ICD-10-CM | POA: Diagnosis not present

## 2017-03-24 HISTORY — PX: EXCISION MORTON'S NEUROMA: SHX5013

## 2017-03-24 LAB — GLUCOSE, CAPILLARY
GLUCOSE-CAPILLARY: 110 mg/dL — AB (ref 65–99)
Glucose-Capillary: 108 mg/dL — ABNORMAL HIGH (ref 65–99)

## 2017-03-24 SURGERY — EXCISION, MORTON'S NEUROMA
Anesthesia: General | Site: Foot | Laterality: Left | Wound class: Clean

## 2017-03-24 MED ORDER — ONDANSETRON HCL 4 MG/2ML IJ SOLN
INTRAMUSCULAR | Status: DC | PRN
  Administered 2017-03-24: 4 mg via INTRAVENOUS

## 2017-03-24 MED ORDER — POVIDONE-IODINE 7.5 % EX SOLN
Freq: Once | CUTANEOUS | Status: DC
Start: 2017-03-24 — End: 2017-03-24
  Filled 2017-03-24: qty 118

## 2017-03-24 MED ORDER — FAMOTIDINE 20 MG PO TABS
20.0000 mg | ORAL_TABLET | Freq: Once | ORAL | Status: AC
  Administered 2017-03-24: 20 mg via ORAL

## 2017-03-24 MED ORDER — CEFAZOLIN SODIUM-DEXTROSE 2-4 GM/100ML-% IV SOLN
INTRAVENOUS | Status: AC
  Filled 2017-03-24: qty 100

## 2017-03-24 MED ORDER — LIDOCAINE HCL (PF) 2 % IJ SOLN
INTRAMUSCULAR | Status: AC
  Filled 2017-03-24: qty 2

## 2017-03-24 MED ORDER — FENTANYL CITRATE (PF) 100 MCG/2ML IJ SOLN
INTRAMUSCULAR | Status: AC
  Filled 2017-03-24: qty 2

## 2017-03-24 MED ORDER — FAMOTIDINE 20 MG PO TABS
ORAL_TABLET | ORAL | Status: AC
  Filled 2017-03-24: qty 1

## 2017-03-24 MED ORDER — MIDAZOLAM HCL 2 MG/2ML IJ SOLN
INTRAMUSCULAR | Status: AC
  Filled 2017-03-24: qty 2

## 2017-03-24 MED ORDER — ACETAMINOPHEN-CODEINE #3 300-30 MG PO TABS: 1.0000 | ORAL_TABLET | ORAL | 0 refills | Status: DC | PRN

## 2017-03-24 MED ORDER — MIDAZOLAM HCL 2 MG/2ML IJ SOLN
INTRAMUSCULAR | Status: DC | PRN
  Administered 2017-03-24: 1 mg via INTRAVENOUS
  Administered 2017-03-24: 2 mg via INTRAVENOUS
  Administered 2017-03-24: 1 mg via INTRAVENOUS

## 2017-03-24 MED ORDER — FENTANYL CITRATE (PF) 100 MCG/2ML IJ SOLN
INTRAMUSCULAR | Status: DC | PRN
  Administered 2017-03-24 (×4): 25 ug via INTRAVENOUS

## 2017-03-24 MED ORDER — BUPIVACAINE HCL (PF) 0.25 % IJ SOLN
INTRAMUSCULAR | Status: AC
  Filled 2017-03-24: qty 30

## 2017-03-24 MED ORDER — LACTATED RINGERS IV SOLN
INTRAVENOUS | Status: DC | PRN
  Administered 2017-03-24: 07:00:00 via INTRAVENOUS

## 2017-03-24 MED ORDER — ONDANSETRON HCL 4 MG/2ML IJ SOLN: 4.0000 mg | Freq: Once | INTRAMUSCULAR | Status: DC | PRN

## 2017-03-24 MED ORDER — SODIUM CHLORIDE 0.9 % IV SOLN
INTRAVENOUS | Status: DC
  Administered 2017-03-24: 07:00:00 via INTRAVENOUS

## 2017-03-24 MED ORDER — BUPIVACAINE-EPINEPHRINE (PF) 0.5% -1:200000 IJ SOLN
INTRAMUSCULAR | Status: AC
  Filled 2017-03-24: qty 30

## 2017-03-24 MED ORDER — PROPOFOL 500 MG/50ML IV EMUL
INTRAVENOUS | Status: AC
  Filled 2017-03-24: qty 50

## 2017-03-24 MED ORDER — BUPIVACAINE HCL (PF) 0.5 % IJ SOLN
INTRAMUSCULAR | Status: AC
  Filled 2017-03-24: qty 30

## 2017-03-24 MED ORDER — PROPOFOL 500 MG/50ML IV EMUL
INTRAVENOUS | Status: DC | PRN
  Administered 2017-03-24 (×2): 30 ug via INTRAVENOUS
  Administered 2017-03-24: 40 ug via INTRAVENOUS

## 2017-03-24 MED ORDER — PROPOFOL 500 MG/50ML IV EMUL
INTRAVENOUS | Status: DC | PRN
  Administered 2017-03-24: 50 ug/kg/min via INTRAVENOUS

## 2017-03-24 MED ORDER — PROPOFOL 10 MG/ML IV BOLUS
INTRAVENOUS | Status: AC
  Filled 2017-03-24: qty 20

## 2017-03-24 MED ORDER — FENTANYL CITRATE (PF) 100 MCG/2ML IJ SOLN: 25.0000 ug | INTRAMUSCULAR | Status: DC | PRN

## 2017-03-24 MED ORDER — BUPIVACAINE-EPINEPHRINE (PF) 0.25% -1:200000 IJ SOLN
INTRAMUSCULAR | Status: AC
  Filled 2017-03-24: qty 30

## 2017-03-24 MED ORDER — BUPIVACAINE HCL (PF) 0.5 % IJ SOLN
INTRAMUSCULAR | Status: DC | PRN
  Administered 2017-03-24: 10 mL

## 2017-03-24 SURGICAL SUPPLY — 31 items
BAG COUNTER SPONGE EZ (MISCELLANEOUS) ×2 IMPLANT
BANDAGE ELASTIC 4 LF NS (GAUZE/BANDAGES/DRESSINGS) ×3 IMPLANT
BANDAGE STRETCH 3X4.1 STRL (GAUZE/BANDAGES/DRESSINGS) ×3 IMPLANT
BNDG ESMARK 4X12 TAN STRL LF (GAUZE/BANDAGES/DRESSINGS) ×3 IMPLANT
CANISTER SUCT 1200ML W/VALVE (MISCELLANEOUS) ×3 IMPLANT
CLOSURE WOUND 1/4X4 (GAUZE/BANDAGES/DRESSINGS) ×1
COUNTER SPONGE BAG EZ (MISCELLANEOUS) ×1
CUFF TOURN 18 STER (MISCELLANEOUS) ×3 IMPLANT
CUFF TOURN DUAL PL 12 NO SLV (MISCELLANEOUS) IMPLANT
DURAPREP 26ML APPLICATOR (WOUND CARE) ×3 IMPLANT
ELECT REM PT RETURN 9FT ADLT (ELECTROSURGICAL) ×3
ELECTRODE REM PT RTRN 9FT ADLT (ELECTROSURGICAL) ×1 IMPLANT
GAUZE PETRO XEROFOAM 1X8 (MISCELLANEOUS) ×3 IMPLANT
GAUZE SPONGE 4X4 12PLY STRL (GAUZE/BANDAGES/DRESSINGS) ×3 IMPLANT
GLOVE BIO SURGEON STRL SZ7.5 (GLOVE) ×3 IMPLANT
GLOVE INDICATOR 8.0 STRL GRN (GLOVE) ×3 IMPLANT
GOWN STRL REUS W/ TWL LRG LVL3 (GOWN DISPOSABLE) ×2 IMPLANT
GOWN STRL REUS W/TWL LRG LVL3 (GOWN DISPOSABLE) ×4
LABEL OR SOLS (LABEL) ×3 IMPLANT
NEEDLE HYPO 25X1 1.5 SAFETY (NEEDLE) ×3 IMPLANT
NS IRRIG 500ML POUR BTL (IV SOLUTION) ×3 IMPLANT
PACK EXTREMITY ARMC (MISCELLANEOUS) ×3 IMPLANT
PENCIL ELECTRO HAND CTR (MISCELLANEOUS) ×3 IMPLANT
SOL PREP PVP 2OZ (MISCELLANEOUS) ×3
SOLUTION PREP PVP 2OZ (MISCELLANEOUS) ×1 IMPLANT
STOCKINETTE STRL 6IN 960660 (GAUZE/BANDAGES/DRESSINGS) ×3 IMPLANT
STRAP SAFETY BODY (MISCELLANEOUS) ×3 IMPLANT
STRIP CLOSURE SKIN 1/4X4 (GAUZE/BANDAGES/DRESSINGS) ×2 IMPLANT
SUT ETHILON 5-0 FS-2 18 BLK (SUTURE) ×3 IMPLANT
SUT VIC AB 4-0 FS2 27 (SUTURE) ×3 IMPLANT
SYRINGE 10CC LL (SYRINGE) ×6 IMPLANT

## 2017-03-24 NOTE — Discharge Instructions (Addendum)
1. Elevate left lower extremity.  2. Keep the bandage on the left foot clean, dry, and do not remove.  3. Sponge bathe only left lower extremity.  4. Wear surgical shoe on the left foot whenever walking or standing.  5. Take one pain pill, Tylenol with Codeine, every 4 hours as needed for pain.  General Anesthesia, Adult, Care After These instructions provide you with information about caring for yourself after your procedure. Your health care provider may also give you more specific instructions. Your treatment has been planned according to current medical practices, but problems sometimes occur. Call your health care provider if you have any problems or questions after your procedure. What can I expect after the procedure? After the procedure, it is common to have:  Vomiting.  A sore throat.  Mental slowness.  It is common to feel:  Nauseous.  Cold or shivery.  Sleepy.  Tired.  Sore or achy, even in parts of your body where you did not have surgery.  Follow these instructions at home: For at least 24 hours after the procedure:  Do not: ? Participate in activities where you could fall or become injured. ? Drive. ? Use heavy machinery. ? Drink alcohol. ? Take sleeping pills or medicines that cause drowsiness. ? Make important decisions or sign legal documents. ? Take care of children on your own.  Rest. Eating and drinking  If you vomit, drink water, juice, or soup when you can drink without vomiting.  Drink enough fluid to keep your urine clear or pale yellow.  Make sure you have little or no nausea before eating solid foods.  Follow the diet recommended by your health care provider. General instructions  Have a responsible adult stay with you until you are awake and alert.  Return to your normal activities as told by your health care provider. Ask your health care provider what activities are safe for you.  Take over-the-counter and prescription  medicines only as told by your health care provider.  If you smoke, do not smoke without supervision.  Keep all follow-up visits as told by your health care provider. This is important. Contact a health care provider if:  You continue to have nausea or vomiting at home, and medicines are not helpful.  You cannot drink fluids or start eating again.  You cannot urinate after 8-12 hours.  You develop a skin rash.  You have fever.  You have increasing redness at the site of your procedure. Get help right away if:  You have difficulty breathing.  You have chest pain.  You have unexpected bleeding.  You feel that you are having a life-threatening or urgent problem. This information is not intended to replace advice given to you by your health care provider. Make sure you discuss any questions you have with your health care provider. Document Released: 01/16/2001 Document Revised: 03/14/2016 Document Reviewed: 09/24/2015 Elsevier Interactive Patient Education  [employer].

## 2017-03-24 NOTE — H&P (Signed)
Medical history and physical in the chart was reviewed. No interval change. Patient stable for surgery  

## 2017-03-24 NOTE — Op Note (Signed)
Date of operation: 03/24/2017.  Surgeon: Durward Fortes DPM.  Preoperative diagnosis: Neuroma left foot.  Postoperative diagnosis: Same.  Procedure: Excision Morton's neuroma left forefoot.  Anesthesia: Local Mac.  Hemostasis: Pneumatic tourniquet left ankle 250 mmHg.  Estimated blood loss: Less than 5 cc.  Pathology: Neuroma left third interspace.  Complications: None apparent.  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 Conservative outpatient treatment has been unsuccessful and he elects for surgical removal.  Operative procedure: Patient was taken to the operating room and placed on the table in the supine position. Following satisfactory sedation the left forefoot was anesthetized with 10 cc of 0.5% bupivacaine plain around the central metatarsals. A pneumatic tourniquet was then applied at the level of the left ankle and the foot was prepped and draped in the usual sterile fashion. Foot was exsanguinated and the tourniquet inflated to 250 mmHg.    Attention was then directed to the dorsal aspect of the left foot where an approximate 3.5 cm linear incision was made coursing proximal to distal in the third interspace and ending in the web space between the third and fourth toes. The incision was deepened via blunt dissection with a hemostat down to the level of the nerve through the intermetatarsal ligament area where the nerve was expressed through the plantar aspect of the wound with plantar pressure on the forefoot. The nerve tissue was freed from the surrounding normal anatomy. Distal branches to the third and fourth toes were identified and clamped with hemostats and then transected. Dissection carried back proximally where the common branch was identified and carried back proximally and then incised removing the neuroma. The wound was then flushed with copious amounts of sterile saline and closed using 4-0 Vicryl  running suture for all layers from deep and superficial subcutaneous to subcuticular closure. Tincture of benzoin and Steri-Strips applied followed by 4 x 4's, and forearm Kerlix and an Ace wrap. Tourniquet was released and blood flow noted to return immediately to all digits. Patient tolerated the procedure and anesthesia well and was transported to the PACU with vital signs stable and in good condition.

## 2017-03-24 NOTE — Anesthesia Preprocedure Evaluation (Signed)
Anesthesia Evaluation  Patient identified by MRN, date of birth, ID band Patient awake    Reviewed: Allergy & Precautions, H&P , NPO status , Patient's Chart, lab work & pertinent test results, reviewed documented beta blocker date and time   History of Anesthesia Complications (+) PONV and history of anesthetic complications  Airway Mallampati: II   Neck ROM: full    Dental  (+) Poor Dentition   Pulmonary neg pulmonary ROS,    Pulmonary exam normal        Cardiovascular Exercise Tolerance: Good hypertension, (-) angina+ CAD and + Peripheral Vascular Disease  negative cardio ROS Normal cardiovascular exam Rhythm:regular Rate:Normal     Neuro/Psych negative neurological ROS  negative psych ROS   GI/Hepatic negative GI ROS, Neg liver ROS, GERD  ,  Endo/Other  negative endocrine ROSdiabetes, Well Controlled, Type 2  Renal/GU negative Renal ROS  negative genitourinary   Musculoskeletal   Abdominal   Peds  Hematology negative hematology ROS (+)   Anesthesia Other Findings Past Medical History: No date: Cancer ([place])     Comment: basal cell No date: Complication of anesthesia No date: GERD (gastroesophageal reflux disease) No date: High cholesterol No date: Hypertension 10/22/2015: Personal history of tobacco use, presenting ha* No date: PONV (postoperative nausea and vomiting)     Comment: gets very sick from anesthesia No date: Pre-diabetes Past Surgical History: No date: ELBOW SURGERY No date: SKIN CANCER EXCISION No date: WRIST SURGERY Right     Comment: Trauma resulting in prosthetics in the right               wrist and elbow   Reproductive/Obstetrics negative OB ROS                             Anesthesia Physical Anesthesia Plan  ASA: III  Anesthesia Plan: General   Post-op Pain Management:    Induction:   Airway Management Planned:   Additional Equipment:    Intra-op Plan:   Post-operative Plan:   Informed Consent: I have reviewed the patients History and Physical, chart, labs and discussed the procedure including the risks, benefits and alternatives for the proposed anesthesia with the patient or authorized representative who has indicated his/her understanding and acceptance.   Dental Advisory Given  Plan Discussed with: CRNA  Anesthesia Plan Comments:         Anesthesia Quick Evaluation

## 2017-03-24 NOTE — Interval H&P Note (Signed)
History and Physical Interval Note:  03/24/2017 7:10 AM  Thomas Morgan  has presented today for surgery, with the diagnosis of G57.62 Mortons neuroma  The various methods of treatment have been discussed with the patient and family. After consideration of risks, benefits and other options for treatment, the patient has consented to  Procedure(s): EXCISION MORTON'S NEUROMA (Left) as a surgical intervention .  The patient's history has been reviewed, patient examined, no change in status, stable for surgery.  I have reviewed the patient's chart and labs.  Questions were answered to the patient's satisfaction.     Durward Fortes

## 2017-03-24 NOTE — Transfer of Care (Signed)
Immediate Anesthesia Transfer of Care Note  Patient: Thomas Morgan  Procedure(s) Performed: Procedure(s): EXCISION MORTON'S NEUROMA (Left)  Patient Location: PACU  Anesthesia Type:General  Level of Consciousness: awake and alert   Airway & Oxygen Therapy: Patient Spontanous Breathing and Patient connected to nasal cannula oxygen  Post-op Assessment: Report given to RN and Post -op Vital signs reviewed and stable  Post vital signs: Reviewed and stable  Last Vitals:  Vitals:   03/24/17 0624  BP: 119/84  Pulse: 65  Resp: 18  Temp: 36.3 C    Last Pain:  Vitals:   03/24/17 0624  TempSrc: Tympanic         Complications: No apparent anesthesia complications

## 2017-03-24 NOTE — Anesthesia Post-op Follow-up Note (Cosign Needed)
Anesthesia QCDR form completed.        

## 2017-03-27 LAB — SURGICAL PATHOLOGY

## 2017-03-27 NOTE — Anesthesia Postprocedure Evaluation (Signed)
Anesthesia Post Note  Patient: Thomas Morgan  Procedure(s) Performed: Procedure(s) (LRB): EXCISION MORTON'S NEUROMA (Left)  Patient location during evaluation: PACU Anesthesia Type: General Level of consciousness: awake and alert Pain management: pain level controlled Vital Signs Assessment: post-procedure vital signs reviewed and stable Respiratory status: spontaneous breathing, nonlabored ventilation, respiratory function stable and patient connected to nasal cannula oxygen Cardiovascular status: blood pressure returned to baseline and stable Postop Assessment: no signs of nausea or vomiting Anesthetic complications: no     Last Vitals:  Vitals:   03/24/17 0907 03/24/17 0930  BP: (!) 143/80 (!) 138/59  Pulse: (!) 57 (!) 59  Resp: 16 16  Temp: 36.1 C     Last Pain:  Vitals:   03/27/17 0902  TempSrc:   PainSc: 0-No pain                 Molli Barrows

## 2017-04-08 ENCOUNTER — Other Ambulatory Visit: Payer: Self-pay | Admitting: Family Medicine

## 2017-04-08 DIAGNOSIS — E876 Hypokalemia: Secondary | ICD-10-CM

## 2017-05-03 ENCOUNTER — Other Ambulatory Visit: Payer: Self-pay | Admitting: Family Medicine

## 2017-06-20 ENCOUNTER — Ambulatory Visit: Payer: Medicare Other

## 2017-07-21 ENCOUNTER — Other Ambulatory Visit: Payer: Self-pay | Admitting: Family Medicine

## 2017-07-21 DIAGNOSIS — E785 Hyperlipidemia, unspecified: Secondary | ICD-10-CM

## 2017-07-21 DIAGNOSIS — I10 Essential (primary) hypertension: Secondary | ICD-10-CM

## 2017-07-27 ENCOUNTER — Telehealth: Payer: Self-pay | Admitting: Family Medicine

## 2017-08-15 ENCOUNTER — Other Ambulatory Visit: Payer: Self-pay | Admitting: Family Medicine

## 2017-08-15 DIAGNOSIS — I10 Essential (primary) hypertension: Secondary | ICD-10-CM

## 2017-08-15 MED ORDER — HYDROCHLOROTHIAZIDE 50 MG PO TABS: 50.0000 mg | ORAL_TABLET | Freq: Every day | ORAL | 12 refills | Status: DC

## 2017-08-15 NOTE — Telephone Encounter (Signed)
Please review. Thanks!  

## 2017-08-15 NOTE — Telephone Encounter (Signed)
[facility] faxed refill request for following medications:  hydrochlorothiazide (HYDRODIURIL) 50 MG tablet      Please advise,Thanks [place]

## 2017-08-21 ENCOUNTER — Other Ambulatory Visit: Payer: Self-pay | Admitting: Family Medicine

## 2017-08-21 DIAGNOSIS — E785 Hyperlipidemia, unspecified: Secondary | ICD-10-CM

## 2017-08-27 ENCOUNTER — Other Ambulatory Visit: Payer: Self-pay | Admitting: Family Medicine

## 2017-08-27 DIAGNOSIS — E785 Hyperlipidemia, unspecified: Secondary | ICD-10-CM

## 2017-08-29 ENCOUNTER — Ambulatory Visit: Payer: Medicare Other | Admitting: Family Medicine

## 2017-08-29 ENCOUNTER — Ambulatory Visit
Admission: RE | Admit: 2017-08-29 | Discharge: 2017-08-29 | Disposition: A | Discharge status: Home / Self Care | Payer: Medicare Other | Source: Ambulatory Visit | Attending: Family Medicine | Admitting: Family Medicine

## 2017-08-29 ENCOUNTER — Encounter: Payer: Self-pay | Admitting: Family Medicine

## 2017-08-29 VITALS — BP 138/72 | HR 78 | Temp 98.5°F | Resp 16 | Ht 70.5 in | Wt 219.0 lb

## 2017-08-29 DIAGNOSIS — M79642 Pain in left hand: Secondary | ICD-10-CM

## 2017-08-29 DIAGNOSIS — M85842 Other specified disorders of bone density and structure, left hand: Secondary | ICD-10-CM | POA: Diagnosis not present

## 2017-08-29 DIAGNOSIS — J069 Acute upper respiratory infection, unspecified: Secondary | ICD-10-CM | POA: Diagnosis not present

## 2017-08-29 NOTE — Patient Instructions (Addendum)
   Go to the [facility] on [address] for left hand Xray    Upper Respiratory Infection, Adult Most upper respiratory infections (URIs) are caused by a virus. A URI affects the nose, throat, and upper air passages. The most common type of URI is often called "the common cold." Follow these instructions at home:  Take medicines only as told by your doctor.  Gargle warm saltwater or take cough drops to comfort your throat as told by your doctor.  Use a warm mist humidifier or inhale steam from a shower to increase air moisture. This may make it easier to breathe.  Drink enough fluid to keep your pee (urine) clear or pale yellow.  Eat soups and other clear broths.  Have a healthy diet.  Rest as needed.  Go back to work when your fever is gone or your doctor says it is okay. ? You may need to stay home longer to avoid giving your URI to others. ? You can also wear a face mask and wash your hands often to prevent spread of the virus.  Use your inhaler more if you have asthma.  Do not use any tobacco products, including cigarettes, chewing tobacco, or electronic cigarettes. If you need help quitting, ask your doctor. Contact a doctor if:  You are getting worse, not better.  Your symptoms are not helped by medicine.  You have chills.  You are getting more short of breath.  You have brown or red mucus.  You have yellow or brown discharge from your nose.  You have pain in your face, especially when you bend forward.  You have a fever.  You have puffy (swollen) neck glands.  You have pain while swallowing.  You have white areas in the back of your throat. Get help right away if:  You have very bad or constant: ? Headache. ? Ear pain. ? Pain in your forehead, behind your eyes, and over your cheekbones (sinus pain). ? Chest pain.  You have long-lasting (chronic) lung disease and any of the following: ? Wheezing. ? Long-lasting  cough. ? Coughing up blood. ? A change in your usual mucus.  You have a stiff neck.  You have changes in your: ? Vision. ? Hearing. ? Thinking. ? Mood. This information is not intended to replace advice given to you by your health care provider. Make sure you discuss any questions you have with your health care provider. Document Released: 03/28/2008 Document Revised: 06/12/2016 Document Reviewed: 01/15/2014 Elsevier Interactive Patient Education  2018 [employer].

## 2017-08-29 NOTE — Progress Notes (Signed)
Patient: Thomas Morgan Male    DOB: [birth year]   66 y.o.   MRN: [id number] Visit Date: 08/29/2017  Today's Provider: Lelon Huh, MD   Chief Complaint  Patient presents with  . Hand Pain  . Sore Throat   Subjective:    Hand Pain   The incident occurred 5 to 7 days ago. There was no injury mechanism. The pain is present in the left hand. The quality of the pain is described as aching. The pain is mild. The pain has been constant since the incident. Pertinent negatives include no numbness or tingling. The symptoms are aggravated by movement. He has tried NSAIDs and rest (also tried icyhot and aspercreme) for the symptoms. The treatment provided mild relief.  Sore Throat   This is a new problem. The current episode started today. The problem has been unchanged. Neither side of throat is experiencing more pain than the other. There has been no fever. The patient is experiencing no pain. Associated symptoms include congestion, a hoarse voice and trouble swallowing. Pertinent negatives include no coughing. He has had no exposure to strep. He has tried nothing for the symptoms.       Allergies  Allergen Reactions  . Crestor [Rosuvastatin] Other (See Comments)    Muscle aches  . Hydrocodone Other (See Comments)    Palpitations, increased heart rate  . Lescol  [Fluvastatin]     Muscle Aches     Current Outpatient Medications:  .  acetaminophen-codeine (TYLENOL #3) 300-30 MG tablet, Take 1-2 tablets by mouth every 4 (four) hours as needed for moderate pain., Disp: 30 tablet, Rfl: 0 .  aspirin (ASPIR-LOW) 81 MG EC tablet, Take 81 mg by mouth daily. , Disp: , Rfl:  .  hydrochlorothiazide (HYDRODIURIL) 50 MG tablet, Take 1 tablet (50 mg total) by mouth daily., Disp: 30 tablet, Rfl: 12 .  lovastatin (MEVACOR) 40 MG tablet, TAKE 1 TABLET BY MOUTH EVERY DAY, Disp: 30 tablet, Rfl: 0 .  lovastatin (MEVACOR) 40 MG tablet, TAKE 1 TABLET BY MOUTH EVERY DAY, Disp: 30 tablet, Rfl: 5 .   omeprazole (PRILOSEC) 20 MG capsule, TAKE 2 CAPSULES BY MOUTH EVERY DAY, Disp: 180 capsule, Rfl: 4 .  ONETOUCH VERIO test strip, TEST ONCE DAILY, Disp: 100 each, Rfl: 4 .  vitamin C (ASCORBIC ACID) 500 MG tablet, Take 500 mg by mouth 2 (two) times daily. , Disp: , Rfl:  .  potassium chloride (K-DUR) 10 MEQ tablet, Take 4 tablets (40 mEq total) by mouth daily. (Patient not taking: Reported on 08/29/2017), Disp: 100 tablet, Rfl: 0 .  potassium chloride (KLOR-CON M10) 10 MEQ tablet, Take 1 tablet (10 mEq total) by mouth once., Disp: 30 tablet, Rfl: 0  Review of Systems  HENT: Positive for congestion, hoarse voice and trouble swallowing.   Respiratory: Negative for cough.   Neurological: Negative for tingling and numbness.    Social History   Tobacco Use  . Smoking status: Smoker, Current Status Unknown    Packs/day: 1.00    Years: 45.00    Pack years: 45.00    Types: Cigarettes  . Smokeless tobacco: Never Used  Substance Use Topics  . Alcohol use: No    Alcohol/week: 0.0 oz   Objective:   BP 138/72 (BP Location: Left Arm, Patient Position: Sitting, Cuff Size: Large)   Pulse 78   Temp 98.5 F (36.9 C)   Resp 16   Ht 5' 10.5" (1.791 m)   Wt 219  lb (99.3 kg)   SpO2 95%   BMI 30.98 kg/m  Vitals:   08/29/17 1550  BP: 138/72  Pulse: 78  Resp: 16  Temp: 98.5 F (36.9 C)  SpO2: 95%  Weight: 219 lb (99.3 kg)  Height: 5' 10.5" (1.791 m)     Physical Exam  General Appearance:    Alert, cooperative, no distress  HENT:   bilateral TM normal without fluid or infection, neck without nodes, throat normal without erythema or exudate, sinuses nontender, post nasal drip noted and nasal mucosa pale and congested  Eyes:    PERRL, conjunctiva/corneas clear, EOM's intact       Lungs:     Clear to auscultation bilaterally, respirations unlabored  Heart:    Regular rate and rhythm  MS::   Tender and swollen dorsal aspect of left 2nd and third MCPs. No erythema. FROM but pain with flexion  of MCP joints.            Assessment & Plan:     1. Left hand pain  - DG Hand Complete Left; Future  2. Upper respiratory tract infection, unspecified type Counseled regarding signs and symptoms of viral and bacterial respiratory infections. Advised to call or return for additional evaluation if he develops any sign of bacterial infection, or if current symptoms last longer than 10 days.         Lelon Huh, MD  [facility]

## 2017-08-31 ENCOUNTER — Telehealth: Payer: Self-pay

## 2017-08-31 DIAGNOSIS — M25542 Pain in joints of left hand: Secondary | ICD-10-CM

## 2017-08-31 NOTE — Telephone Encounter (Signed)
Patient called requesting xray results.

## 2017-09-01 NOTE — Telephone Encounter (Signed)
There are no x-ray orders in epic. Please advise?

## 2017-09-01 NOTE — Telephone Encounter (Signed)
Patient states that he still has not received xray results and states that he is no better.  Please advise.

## 2017-09-04 MED ORDER — INDOMETHACIN 50 MG PO CAPS: ORAL_CAPSULE | ORAL | 0 refills | Status: DC

## 2017-09-04 NOTE — Addendum Note (Signed)
Addended by: Birdie Sons on: 09/04/2017 10:13 AM   Modules accepted: Orders

## 2017-09-04 NOTE — Telephone Encounter (Signed)
Advised patient as below. Medication was sent into the pharmacy.  

## 2017-09-04 NOTE — Telephone Encounter (Signed)
Pt is requesting x-ray results.  UY#[phone]/[id number]

## 2017-09-04 NOTE — Telephone Encounter (Signed)
-----   Message from Birdie Sons, MD sent at 09/04/2017  8:33 AM EST ----- Xray shows erosions of bones of knuckles which could be due to gout or rheumatoid arthritis. Need to check uric acid, ANA, sed rate and rheumatoid factor. Need to start indomethacin 50mg  one tablet two to three times daily as needed for pain, swelling, #30, rf x 0

## 2017-09-04 NOTE — Telephone Encounter (Signed)
Left message to call back  

## 2017-09-05 LAB — ANA,IFA RA DIAG PNL W/RFLX TIT/PATN: ANA: NEGATIVE

## 2017-09-05 LAB — URIC ACID: URIC ACID, SERUM: 6.3 mg/dL (ref 4.0–8.0)

## 2017-09-11 ENCOUNTER — Telehealth: Payer: Self-pay | Admitting: Family Medicine

## 2017-09-11 DIAGNOSIS — M7989 Other specified soft tissue disorders: Secondary | ICD-10-CM

## 2017-09-11 DIAGNOSIS — M79642 Pain in left hand: Secondary | ICD-10-CM

## 2017-09-11 NOTE — Telephone Encounter (Signed)
Pt stated that his hand is still bothering him an=d he would like to move forward with the referral to see a rheumatologist. Pt request that it is a local office. Please advise. Thanks TNP

## 2017-09-11 NOTE — Telephone Encounter (Signed)
Please advise 

## 2017-10-10 DIAGNOSIS — M659 Synovitis and tenosynovitis, unspecified: Secondary | ICD-10-CM | POA: Insufficient documentation

## 2017-11-02 ENCOUNTER — Telehealth: Payer: Self-pay | Admitting: *Deleted

## 2017-11-02 DIAGNOSIS — Z87891 Personal history of nicotine dependence: Secondary | ICD-10-CM

## 2017-11-02 DIAGNOSIS — Z122 Encounter for screening for malignant neoplasm of respiratory organs: Secondary | ICD-10-CM

## 2017-11-02 NOTE — Telephone Encounter (Signed)
Notified patient that annual lung cancer screening low dose CT scan is due currently or will be in near future. Confirmed that patient is within the age range of 55-77, and asymptomatic, (no signs or symptoms of lung cancer). Patient denies illness that would prevent curative treatment for lung cancer if found. Verified smoking history, (current, 47 pack year). The shared decision making visit was done 10/23/15. Patient is agreeable for CT scan being scheduled.

## 2017-11-08 ENCOUNTER — Ambulatory Visit
Admission: RE | Admit: 2017-11-08 | Discharge: 2017-11-08 | Disposition: A | Discharge status: Home / Self Care | Payer: Medicare Other | Source: Ambulatory Visit | Attending: Nurse Practitioner | Admitting: Nurse Practitioner

## 2017-11-08 DIAGNOSIS — J439 Emphysema, unspecified: Secondary | ICD-10-CM | POA: Insufficient documentation

## 2017-11-08 DIAGNOSIS — I2510 Atherosclerotic heart disease of native coronary artery without angina pectoris: Secondary | ICD-10-CM | POA: Insufficient documentation

## 2017-11-08 DIAGNOSIS — Z122 Encounter for screening for malignant neoplasm of respiratory organs: Secondary | ICD-10-CM | POA: Insufficient documentation

## 2017-11-08 DIAGNOSIS — Z87891 Personal history of nicotine dependence: Secondary | ICD-10-CM | POA: Insufficient documentation

## 2017-11-08 DIAGNOSIS — I700 Atherosclerosis of aorta: Secondary | ICD-10-CM | POA: Insufficient documentation

## 2017-11-13 ENCOUNTER — Encounter: Payer: Self-pay | Admitting: *Deleted

## 2017-11-30 ENCOUNTER — Ambulatory Visit: Payer: Medicare Other | Admitting: Family Medicine

## 2017-11-30 ENCOUNTER — Encounter: Payer: Self-pay | Admitting: Family Medicine

## 2017-11-30 VITALS — BP 120/70 | HR 81 | Temp 98.5°F | Resp 16 | Wt 216.0 lb

## 2017-11-30 DIAGNOSIS — Z720 Tobacco use: Secondary | ICD-10-CM

## 2017-11-30 DIAGNOSIS — H6123 Impacted cerumen, bilateral: Secondary | ICD-10-CM | POA: Diagnosis not present

## 2017-11-30 MED ORDER — BUPROPION HCL ER (SR) 150 MG PO TB12: ORAL_TABLET | ORAL | 5 refills | Status: DC

## 2017-11-30 NOTE — Progress Notes (Signed)
Patient: Thomas Morgan Male    DOB: 1951/03/13   67 y.o.   MRN: [id number] Visit Date: 11/30/2017  Today's Provider: Lelon Huh, MD   Chief Complaint  Patient presents with  . Ear Fullness   Subjective:    HPI Ear Fullness:  Patient comes in reporting that his left ear has been stopped up for the past week. Patient has tried using prescription ear drops that was prescribed several years ago for a different ear problem. Patient denies any ear pain or drainage. He also denies any cold symptoms. Patient states the ear congestion worsens after taking a shower.   He also requests prescription for something to help him stop smoking.     Allergies  Allergen Reactions  . Crestor [Rosuvastatin] Other (See Comments)    Muscle aches  . Hydrocodone Other (See Comments)    Palpitations, increased heart rate  . Lescol  [Fluvastatin]     Muscle Aches     Current Outpatient Medications:  .  aspirin (ASPIR-LOW) 81 MG EC tablet, Take 81 mg by mouth daily. , Disp: , Rfl:  .  hydrochlorothiazide (HYDRODIURIL) 50 MG tablet, Take 1 tablet (50 mg total) by mouth daily., Disp: 30 tablet, Rfl: 12 .  lovastatin (MEVACOR) 40 MG tablet, TAKE 1 TABLET BY MOUTH EVERY DAY, Disp: 30 tablet, Rfl: 0 .  lovastatin (MEVACOR) 40 MG tablet, TAKE 1 TABLET BY MOUTH EVERY DAY, Disp: 30 tablet, Rfl: 5 .  omeprazole (PRILOSEC) 20 MG capsule, TAKE 2 CAPSULES BY MOUTH EVERY DAY, Disp: 180 capsule, Rfl: 4 .  ONETOUCH VERIO test strip, TEST ONCE DAILY, Disp: 100 each, Rfl: 4 .  vitamin C (ASCORBIC ACID) 500 MG tablet, Take 500 mg by mouth 2 (two) times daily. , Disp: , Rfl:  .  acetaminophen-codeine (TYLENOL #3) 300-30 MG tablet, Take 1-2 tablets by mouth every 4 (four) hours as needed for moderate pain. (Patient not taking: Reported on 11/30/2017), Disp: 30 tablet, Rfl: 0 .  indomethacin (INDOCIN) 50 MG capsule, Take 1 capsule 2-3 times daily as needed for pain and swelling. (Patient not taking: Reported on  11/30/2017), Disp: 30 capsule, Rfl: 0 .  potassium chloride (K-DUR) 10 MEQ tablet, Take 4 tablets (40 mEq total) by mouth daily. (Patient not taking: Reported on 08/29/2017), Disp: 100 tablet, Rfl: 0 .  potassium chloride (KLOR-CON M10) 10 MEQ tablet, Take 1 tablet (10 mEq total) by mouth once., Disp: 30 tablet, Rfl: 0  Review of Systems  Constitutional: Negative for appetite change, chills and fever.  HENT: Negative for congestion, mouth sores, nosebleeds, postnasal drip, rhinorrhea, sinus pain, sneezing and sore throat.        Fullness in left ear  Respiratory: Negative for chest tightness, shortness of breath and wheezing.   Cardiovascular: Negative for chest pain and palpitations.  Gastrointestinal: Negative for abdominal pain, nausea and vomiting.    Social History   Tobacco Use  . Smoking status: Smoker, Current Status Unknown    Packs/day: 1.00    Years: 45.00    Pack years: 45.00    Types: Cigarettes  . Smokeless tobacco: Never Used  Substance Use Topics  . Alcohol use: No    Alcohol/week: 0.0 oz   Objective:   BP 120/70 (BP Location: Left Arm, Patient Position: Sitting, Cuff Size: Large)   Pulse 81   Temp 98.5 F (36.9 C) (Oral)   Resp 16   Wt 216 lb (98 kg)   SpO2 95% Comment: room  air  BMI 30.55 kg/m  There were no vitals filed for this visit.   Physical Exam  General Appearance:    Alert, cooperative, no distress  HENT:   Both ear canals obstructed by Cerumen          Assessment & Plan:     1. Bilateral impacted cerumen After soaking with Debrox, ear canal was irrigated with water until clear. Patient tolerated procedure well.    2. Tobacco abuse Try - buPROPion (WELLBUTRIN SR) 150 MG 12 hr tablet; 1 tablet daily for 3 days, then 1 tablet twice daily. Stop smoking 14 days after starting medication  Dispense: 60 tablet; Refill: [place], MD  [facility]

## 2017-12-01 ENCOUNTER — Other Ambulatory Visit: Payer: Self-pay | Admitting: Family Medicine

## 2017-12-01 MED ORDER — GLUCOSE BLOOD VI STRP: ORAL_STRIP | 4 refills | Status: DC

## 2017-12-01 NOTE — Telephone Encounter (Signed)
Pt wanting test strips for his Verio OneTouch called [facility] [address] [facility]  Pt states he was in yesterday and it was supposed to be called in at that time.  Pt is almost out of strips.

## 2017-12-01 NOTE — Telephone Encounter (Signed)
Please review. Thanks!  

## 2017-12-14 ENCOUNTER — Ambulatory Visit: Payer: Medicare Other

## 2017-12-14 ENCOUNTER — Encounter: Payer: Medicare Other | Admitting: Family Medicine

## 2017-12-22 ENCOUNTER — Encounter: Payer: Self-pay | Admitting: Family Medicine

## 2017-12-22 ENCOUNTER — Ambulatory Visit (INDEPENDENT_AMBULATORY_CARE_PROVIDER_SITE_OTHER): Payer: Medicare Other

## 2017-12-22 ENCOUNTER — Ambulatory Visit (INDEPENDENT_AMBULATORY_CARE_PROVIDER_SITE_OTHER): Payer: Medicare Other | Admitting: Family Medicine

## 2017-12-22 VITALS — BP 120/62 | HR 92 | Temp 99.5°F | Ht 71.0 in | Wt 213.6 lb

## 2017-12-22 DIAGNOSIS — Z125 Encounter for screening for malignant neoplasm of prostate: Secondary | ICD-10-CM

## 2017-12-22 DIAGNOSIS — D369 Benign neoplasm, unspecified site: Secondary | ICD-10-CM | POA: Diagnosis not present

## 2017-12-22 DIAGNOSIS — I10 Essential (primary) hypertension: Secondary | ICD-10-CM

## 2017-12-22 DIAGNOSIS — E1159 Type 2 diabetes mellitus with other circulatory complications: Secondary | ICD-10-CM

## 2017-12-22 DIAGNOSIS — E785 Hyperlipidemia, unspecified: Secondary | ICD-10-CM | POA: Diagnosis not present

## 2017-12-22 DIAGNOSIS — Z0000 Encounter for general adult medical examination without abnormal findings: Secondary | ICD-10-CM

## 2017-12-22 DIAGNOSIS — Z720 Tobacco use: Secondary | ICD-10-CM

## 2017-12-22 LAB — POCT UA - MICROALBUMIN: Microalbumin Ur, POC: 20 mg/L

## 2017-12-22 NOTE — Patient Instructions (Addendum)
   You are due for a tetanus vaccine. Please check with pharmacist regarding the Td vaccine   The CDC recommends two doses of Shingrix (the shingles vaccine) separated by 2 to 6 months for adults age 68 years and older. I recommend checking with your insurance plan regarding coverage for this vaccine.     Try Saw [place]met[place] to improve urinary stream

## 2017-12-22 NOTE — Progress Notes (Signed)
Subjective:   Thomas Morgan is a 67 y.o. male who presents for an Initial Medicare Annual Wellness Visit.  Review of Systems  N/A  Cardiac Risk Factors include: advanced age (>36men, >66 women);smoking/ tobacco exposure    Objective:    Today's Vitals   12/22/17 1309 12/22/17 1314  BP: 120/62   Pulse: 92   Temp: 99.5 F (37.5 C)   TempSrc: Oral   Weight: 213 lb 9.6 oz (96.9 kg)   Height: 5\' 11"  (1.803 m)   PainSc: 0-No pain 0-No pain   Body mass index is 29.79 kg/m.  Advanced Directives 12/22/2017 03/16/2017  Does Patient Have a Medical Advance Directive? Yes Yes  Type of Paramedic of [place];Living will [place];Living will  Does patient want to make changes to medical advance directive? - No - Patient declined  Copy of [place] in Chart? No - copy requested -    Current Medications (verified) Outpatient Encounter Medications as of 12/22/2017  Medication Sig  . aspirin (ASPIR-LOW) 81 MG EC tablet Take 81 mg by mouth daily.   Marland Kitchen buPROPion (WELLBUTRIN SR) 150 MG 12 hr tablet 1 tablet daily for 3 days, then 1 tablet twice daily. Stop smoking 14 days after starting medication (Patient taking differently: Take 150 mg by mouth 2 (two) times daily. 1 tablet daily for 3 days, then 1 tablet twice daily. Stop smoking 14 days after starting medication)  . glucose blood (ONETOUCH VERIO) test strip TEST ONCE DAILY  . hydrochlorothiazide (HYDRODIURIL) 50 MG tablet Take 1 tablet (50 mg total) by mouth daily.  Marland Kitchen lovastatin (MEVACOR) 40 MG tablet TAKE 1 TABLET BY MOUTH EVERY DAY  . omeprazole (PRILOSEC) 20 MG capsule TAKE 2 CAPSULES BY MOUTH EVERY DAY (Patient taking differently: TAKE 1 CAPSULE BY MOUTH EVERY DAY)  . vitamin C (ASCORBIC ACID) 500 MG tablet Take 500 mg by mouth 2 (two) times daily.   . indomethacin (INDOCIN) 50 MG capsule Take 1 capsule 2-3 times daily as needed for pain and swelling. (Patient not taking:  Reported on 11/30/2017)  . potassium chloride (KLOR-CON M10) 10 MEQ tablet Take 1 tablet (10 mEq total) by mouth once. (Patient not taking: Reported on 12/22/2017)   No facility-administered encounter medications on file as of 12/22/2017.     Allergies (verified) Crestor [rosuvastatin]; Hydrocodone; and Lescol  [fluvastatin]   History: Past Medical History:  Diagnosis Date  . 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 Complication of anesthesia   . GERD (gastroesophageal reflux disease)   . High cholesterol   . Hypertension   . Personal history of tobacco use, presenting hazards to health 10/22/2015  . PONV (postoperative nausea and vomiting)    gets very sick from anesthesia  . Pre-diabetes    Past Surgical History:  Procedure Laterality Date  . ELBOW SURGERY    . 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 SKIN CANCER EXCISION    . WRIST SURGERY Right    Trauma resulting in prosthetics in the right wrist and elbow   Family History  Problem Relation Age of Onset  . Cancer Mother        Lung  . Hypertension Mother   . Heart Problems Father   . AAA (abdominal aortic aneurysm) Father   . Heart attack Brother   . Hypercholesterolemia Brother  Social History   Socioeconomic History  . Marital status: Married    Spouse name: None  . Number of children: 2  . Years of education: None  . Highest education level: Bachelor's degree (e.g., BA, AB, BS)  Social Needs  . Financial resource strain: Not hard at all  . Food insecurity - worry: Never true  . Food insecurity - inability: Never true  . Transportation needs - medical: No  . Transportation needs - non-medical: No  Occupational History  . None  Tobacco Use  . Smoking status: Smoker, Current Status Unknown    Packs/day: 0.25    Years: 45.00    Pack years: 11.25    Types: Cigarettes  . Smokeless  tobacco: Never Used  . Tobacco comment: maybe 2-3 a day currently- trying to quit  Substance and Sexual Activity  . Alcohol use: No    Alcohol/week: 0.0 oz  . Drug use: No  . Sexual activity: None  Other Topics Concern  . None  Social History Narrative  . None   Tobacco Counseling Ready to quit: Yes Counseling given: Not Answered Comment: maybe 2-3 a day currently- trying to quit   Clinical Intake:     Pain : No/denies pain Pain Score: 0-No pain     Nutritional Status: BMI 25 -29 Overweight Nutritional Risks: None Diabetes: No  How often do you need to have someone help you when you read instructions, pamphlets, or other written materials from your doctor or pharmacy?: 1 - Never  Interpreter Needed?: No  Information entered by :: [facility], LPN  Activities of Daily Living In your present state of health, do you have any difficulty performing the following activities: 12/22/2017 03/16/2017  Hearing? N N  Vision? N N  Difficulty concentrating or making decisions? N N  Walking or climbing stairs? N N  Dressing or bathing? N N  Doing errands, shopping? N N  Preparing Food and eating ? N -  Using the Toilet? N -  In the past six months, have you accidently leaked urine? N -  Do you have problems with loss of bowel control? N -  Managing your Medications? N -  Managing your Finances? N -  Housekeeping or managing your Housekeeping? N -  Some recent data might be hidden     Immunizations and Health Maintenance Immunization History  Administered Date(s) Administered  . Influenza, High Dose Seasonal PF 10/08/2015, 07/05/2016  . Influenza-Unspecified 12/01/2017  . Pneumococcal Conjugate-13 10/08/2015  . Pneumococcal Polysaccharide-23 09/10/2008, 12/12/2016  . Td 10/24/1997  . Tdap 06/06/2007   Health Maintenance Due  Topic Date Due  . OPHTHALMOLOGY EXAM  06/07/1960  . FOOT EXAM  10/07/2016  . TETANUS/TDAP  06/05/2017  . HEMOGLOBIN A1C  06/11/2017  . URINE  MICROALBUMIN  12/12/2017    Patient Care Team: Birdie Sons, MD as PCP - General (Family Medicine)  Indicate any recent Medical Services you may have received from other than [facility] providers in the past year (date may be approximate).    Assessment:   This is a routine wellness examination for Thomas Morgan.  Hearing/Vision screen Vision Screening Comments: Pt has vision checks yearly. Currently in the mist of finding a new optometrist due to insurance.  Dietary issues and exercise activities discussed: Current Exercise Habits: Structured exercise class, Type of exercise: strength training/weights;stretching;treadmill;walking, Time (Minutes): 60, Frequency (Times/Week): 3, Weekly Exercise (Minutes/Week): 180, Intensity: Mild  Goals    . Exercise 150 min/wk Moderate Activity     Recommend  to continue exercising 3 days a week for 1 hour.       Depression Screen PHQ 2/9 Scores 12/22/2017 12/22/2017 12/12/2016 10/08/2015  PHQ - 2 Score 0 0 0 0  PHQ- 9 Score 0 - 1 2    Fall Risk Fall Risk  12/22/2017 12/12/2016 10/08/2015  Falls in the past year? No No No    Is the patient's home free of loose throw rugs in walkways, pet beds, electrical cords, etc?   yes      Grab bars in the bathroom? no      Handrails on the stairs?   yes      Adequate lighting?   yes  Timed Get Up and Go performed: N/A  Cognitive Function: Pt declined screening today.        Screening Tests Health Maintenance  Topic Date Due  . OPHTHALMOLOGY EXAM  06/07/1960  . FOOT EXAM  10/07/2016  . TETANUS/TDAP  06/05/2017  . HEMOGLOBIN A1C  06/11/2017  . URINE MICROALBUMIN  12/12/2017  . COLONOSCOPY  12/20/2019  . INFLUENZA VACCINE  Completed  . Hepatitis C Screening  Completed  . PNA vac Low Risk Adult  Completed    Qualifies for Shingles Vaccine? Due for Shingles vaccine. Declined my offer to administer today. Education has been provided regarding the importance of this vaccine. Pt has been advised to call her  insurance company to determine her out of pocket expense. Advised she may also receive this vaccine at her local pharmacy or Health Dept. Verbalized acceptance and understanding.  Cancer Screenings: Lung: Low Dose CT Chest recommended if Age 63-80 years, 30 pack-year currently smoking OR have quit w/in 15years. Patient does not qualify due to having this completed 11/09/17. Colorectal: Up to date  Additional Screenings:  Hepatitis C Screening: Up to date      Plan:  I have personally reviewed and addressed the Medicare Annual Wellness questionnaire and have noted the following in the patient's chart:  A. Medical and social history B. Use of alcohol, tobacco or illicit drugs  C. Current medications and supplements D. Functional ability and status E.  Nutritional status F.  Physical activity G. Advance directives H. List of other physicians I.  Hospitalizations, surgeries, and ER visits in previous 12 months J.  [place] such as hearing and vision if needed, cognitive and depression L. Referrals and appointments - none  In addition, I have reviewed and discussed with patient certain preventive protocols, quality metrics, and best practice recommendations. A written personalized care plan for preventive services as well as general preventive health recommendations were provided to patient.  See attached scanned questionnaire for additional information.   Signed,  Fabio Neighbors, LPN Nurse Health Advisor   Nurse Recommendations: Pt states he is NOT a diabetic. HM shows he is due for an eye exam, foot exam, A1c and urine microalbumin due to diabetes dx. Please advise, thanks. Pt declined the tetanus vaccine today.

## 2017-12-22 NOTE — Patient Instructions (Signed)
Thomas Morgan , Thank you for taking time to come for your Medicare Wellness Visit. I appreciate your ongoing commitment to your health goals. Please review the following plan we discussed and let me know if I can assist you in the future.   Screening recommendations/referrals: Colonoscopy: Up to date Recommended yearly ophthalmology/optometry visit for glaucoma screening and checkup Recommended yearly dental visit for hygiene and checkup  Vaccinations: Influenza vaccine: Up to date Pneumococcal vaccine: Up to date Tdap vaccine: Pt declines today.  Shingles vaccine: Pt declines today.     Advanced directives: Please bring a copy of your POA (Power of Attorney) and/or Living Will to your next appointment.   Conditions/risks identified: Recommend to continue exercising 3 days a week for 1 hour.   Next appointment: 2:00 PM today with Dr Caryn Section.  Preventive Care 68 Years and Older, Male Preventive care refers to lifestyle choices and visits with your health care provider that can promote health and wellness. What does preventive care include?  A yearly physical exam. This is also called an annual well check.  Dental exams once or twice a year.  Routine eye exams. Ask your health care provider how often you should have your eyes checked.  Personal lifestyle choices, including:  Daily care of your teeth and gums.  Regular physical activity.  Eating a healthy diet.  Avoiding tobacco and drug use.  Limiting alcohol use.  Practicing safe sex.  Taking low doses of aspirin every day.  Taking vitamin and mineral supplements as recommended by your health care provider. What happens during an annual well check? The services and screenings done by your health care provider during your annual well check will depend on your age, overall health, lifestyle risk factors, and family history of disease. Counseling  Your health care provider may ask you questions about your:  Alcohol  use.  Tobacco use.  Drug use.  Emotional well-being.  Home and relationship well-being.  Sexual activity.  Eating habits.  History of falls.  Memory and ability to understand (cognition).  Work and work Statistician. Screening  You may have the following tests or measurements:  Height, weight, and BMI.  Blood pressure.  Lipid and cholesterol levels. These may be checked every 5 years, or more frequently if you are over 56 years old.  Skin check.  Lung cancer screening. You may have this screening every year starting at age 48 if you have a 30-pack-year history of smoking and currently smoke or have quit within the past 15 years.  Fecal occult blood test (FOBT) of the stool. You may have this test every year starting at age 35.  Flexible sigmoidoscopy or colonoscopy. You may have a sigmoidoscopy every 5 years or a colonoscopy every 10 years starting at age 43.  Prostate cancer screening. Recommendations will vary depending on your family history and other risks.  Hepatitis C blood test.  Hepatitis B blood test.  Sexually transmitted disease (STD) testing.  Diabetes screening. This is done by checking your blood sugar (glucose) after you have not eaten for a while (fasting). You may have this done every 1-3 years.  Abdominal aortic aneurysm (AAA) screening. You may need this if you are a current or former smoker.  Osteoporosis. You may be screened starting at age 4 if you are at high risk. Talk with your health care provider about your test results, treatment options, and if necessary, the need for more tests. Vaccines  Your health care provider may recommend certain vaccines, such  as:  Influenza vaccine. This is recommended every year.  Tetanus, diphtheria, and acellular pertussis (Tdap, Td) vaccine. You may need a Td booster every 10 years.  Zoster vaccine. You may need this after age 11.  Pneumococcal 13-valent conjugate (PCV13) vaccine. One dose is  recommended after age 36.  Pneumococcal polysaccharide (PPSV23) vaccine. One dose is recommended after age 64. Talk to your health care provider about which screenings and vaccines you need and how often you need them. This information is not intended to replace advice given to you by your health care provider. Make sure you discuss any questions you have with your health care provider. Document Released: 11/06/2015 Document Revised: 06/29/2016 Document Reviewed: 08/11/2015 Elsevier Interactive Patient Education  2017 [place] Prevention in the Home Falls can cause injuries. They can happen to people of all ages. There are many things you can do to make your home safe and to help prevent falls. What can I do on the outside of my home?  Regularly fix the edges of walkways and driveways and fix any cracks.  Remove anything that might make you trip as you walk through a door, such as a raised step or threshold.  Trim any bushes or trees on the path to your home.  Use bright outdoor lighting.  Clear any walking paths of anything that might make someone trip, such as rocks or tools.  Regularly check to see if handrails are loose or broken. Make sure that both sides of any steps have handrails.  Any raised decks and porches should have guardrails on the edges.  Have any leaves, snow, or ice cleared regularly.  Use sand or salt on walking paths during winter.  Clean up any spills in your garage right away. This includes oil or grease spills. What can I do in the bathroom?  Use night lights.  Install grab bars by the toilet and in the tub and shower. Do not use towel bars as grab bars.  Use non-skid mats or decals in the tub or shower.  If you need to sit down in the shower, use a plastic, non-slip stool.  Keep the floor dry. Clean up any water that spills on the floor as soon as it happens.  Remove soap buildup in the tub or shower regularly.  Attach bath mats  securely with double-sided non-slip rug tape.  Do not have throw rugs and other things on the floor that can make you trip. What can I do in the bedroom?  Use night lights.  Make sure that you have a light by your bed that is easy to reach.  Do not use any sheets or blankets that are too big for your bed. They should not hang down onto the floor.  Have a firm chair that has side arms. You can use this for support while you get dressed.  Do not have throw rugs and other things on the floor that can make you trip. What can I do in the kitchen?  Clean up any spills right away.  Avoid walking on wet floors.  Keep items that you use a lot in easy-to-reach places.  If you need to reach something above you, use a strong step stool that has a grab bar.  Keep electrical cords out of the way.  Do not use floor polish or wax that makes floors slippery. If you must use wax, use non-skid floor wax.  Do not have throw rugs and other things on the  floor that can make you trip. What can I do with my stairs?  Do not leave any items on the stairs.  Make sure that there are handrails on both sides of the stairs and use them. Fix handrails that are broken or loose. Make sure that handrails are as long as the stairways.  Check any carpeting to make sure that it is firmly attached to the stairs. Fix any carpet that is loose or worn.  Avoid having throw rugs at the top or bottom of the stairs. If you do have throw rugs, attach them to the floor with carpet tape.  Make sure that you have a light switch at the top of the stairs and the bottom of the stairs. If you do not have them, ask someone to add them for you. What else can I do to help prevent falls?  Wear shoes that:  Do not have high heels.  Have rubber bottoms.  Are comfortable and fit you well.  Are closed at the toe. Do not wear sandals.  If you use a stepladder:  Make sure that it is fully opened. Do not climb a closed  stepladder.  Make sure that both sides of the stepladder are locked into place.  Ask someone to hold it for you, if possible.  Clearly mark and make sure that you can see:  Any grab bars or handrails.  First and last steps.  Where the edge of each step is.  Use tools that help you move around (mobility aids) if they are needed. These include:  Canes.  Walkers.  Scooters.  Crutches.  Turn on the lights when you go into a dark area. Replace any light bulbs as soon as they burn out.  Set up your furniture so you have a clear path. Avoid moving your furniture around.  If any of your floors are uneven, fix them.  If there are any pets around you, be aware of where they are.  Review your medicines with your doctor. Some medicines can make you feel dizzy. This can increase your chance of falling. Ask your doctor what other things that you can do to help prevent falls. This information is not intended to replace advice given to you by your health care provider. Make sure you discuss any questions you have with your health care provider. Document Released: 08/06/2009 Document Revised: 03/17/2016 Document Reviewed: 11/14/2014 Elsevier Interactive Patient Education  2017 [employer].

## 2017-12-22 NOTE — Progress Notes (Signed)
Patient: Thomas Morgan, Male    DOB: 1950-12-25, 67 y.o.   MRN: [id number] Visit Date: 12/22/2017  Today's Provider: Lelon Huh, MD   Chief Complaint  Patient presents with  . Annual Exam  . Diabetes  . Hypertension  . Hyperlipidemia   Subjective:   Patient saw Thomas Morgan today at 1:20 pm for AWV.   Complete Physical [place] Thomas Morgan is a 67 y.o. male. He feels well. He reports exercising some. He reports he is sleeping well.  -----------------------------------------------------------  Diabetes Mellitus Type II, Follow-up:   Lab Results  Component Value Date   HGBA1C 6.4 (H) 12/12/2016   HGBA1C 6.3 07/05/2016   HGBA1C 7.4 (H) 10/08/2015   Last seen for diabetes 1 years ago.  Management since then includes; no changes. He reports good compliance with treatment. He is not having side effects. none Current symptoms include none and have been unchanged. Home blood sugar records: fasting range: 113  Episodes of hypoglycemia? no   Current Insulin Regimen: n/a Most Recent Eye Exam: overdue 1 1/2 years. Due to insurance Weight trend: stable Prior visit with dietician: no Current diet: well balanced Current exercise: walking  ----------------------------------------------------------------   Hypertension, follow-up:  BP Readings from Last 3 Encounters:  12/22/17 120/62  11/30/17 120/70  08/29/17 138/72    He was last seen for hypertension 1 years ago.  BP at that visit was 150/80. Management since that visit includes; no changes.He reports good compliance with treatment. He is not having side effects. none He is exercising. He is adherent to low salt diet.   Outside blood pressures are normal. He is experiencing none.  Patient denies none.   Cardiovascular risk factors include diabetes mellitus.  Use of agents associated with hypertension: none.   ----------------------------------------------------------------    Lipid/Cholesterol, Follow-up:    Last seen for this 1 years ago.  Management since that visit includes; labs checked. Advised pt to take lovastatin every day. Also take otc coq 10 200 mg qd to prevent side effects.  Last Lipid Panel:    Component Value Date/Time   CHOL 194 12/12/2016 1121   TRIG 200 (H) 12/12/2016 1121   HDL 31 (L) 12/12/2016 1121   CHOLHDL 6.3 (H) 12/12/2016 1121   LDLCALC 123 (H) 12/12/2016 1121    He reports good compliance with treatment. He is not having side effects. none  Wt Readings from Last 3 Encounters:  12/22/17 213 lb 9.6 oz (96.9 kg)  11/30/17 216 lb (98 kg)  11/08/17 209 lb (94.8 kg)    ----------------------------------------------------------------    Review of Systems  Constitutional: Negative.   HENT: Positive for congestion.   Eyes: Negative.   Respiratory: Negative.   Cardiovascular: Negative.   Gastrointestinal: Negative.   Endocrine: Positive for polyuria.  Genitourinary: Positive for frequency.  Musculoskeletal: Negative.   Skin: Negative.   Allergic/Immunologic: Positive for environmental allergies.  Neurological: Negative.   Hematological: Bruises/bleeds easily.  Psychiatric/Behavioral: Negative.     Social History   Socioeconomic History  . Marital status: Married    Spouse name: Not on file  . Number of children: 2  . Years of education: Not on file  . Highest education level: Bachelor's degree (e.g., BA, AB, BS)  Social Needs  . Financial resource strain: Not hard at all  . Food insecurity - worry: Never true  . Food insecurity - inability: Never true  . Transportation needs - medical: No  . Transportation needs - non-medical: No  Occupational  History  . Not on file  Tobacco Use  . Smoking status: Smoker, Current Status Unknown    Packs/day: 0.25    Years: 45.00    Pack years: 11.25    Types: Cigarettes  . Smokeless tobacco: Never Used  . Tobacco comment: maybe 2-3 a day currently- trying to quit  Substance and Sexual Activity  .  Alcohol use: No    Alcohol/week: 0.0 oz  . Drug use: No  . Sexual activity: Not on file  Other Topics Concern  . Not on file  Social History Narrative  . Not on file    Past Medical History:  Diagnosis Date  . Cancer ([place])    basal cell  . Complication of anesthesia   . GERD (gastroesophageal reflux disease)   . High cholesterol   . Hypertension   . Personal history of tobacco use, presenting hazards to health 10/22/2015  . PONV (postoperative nausea and vomiting)    gets very sick from anesthesia  . Pre-diabetes      Patient Active Problem List   Diagnosis Date Noted  . Aortic aneurysm ([place]) 03/17/2017  . Abdominal bruit 03/10/2017  . Coronary arteriosclerosis 12/12/2016  . Atherosclerosis of aorta ([place]) 11/14/2016  . Tobacco abuse 10/08/2015  . Numbness and tingling of foot 10/08/2015  . Allergic rhinitis 04/24/2015  . Back pain, chronic 04/24/2015  . Diabetes ([place]) 04/24/2015  . 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 GERD (gastroesophageal reflux disease) 04/24/2015  . Hypertension 04/24/2015  . Obesity 04/24/2015  . Disorder of male genital organs 04/24/2015  . Tubular adenoma 04/24/2015  . 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 Chronic cough 10/03/2011  . Ventral hernia 05/25/2010  . Hyperlipidemia 10/12/2009  . Arthralgia of multiple joints 10/12/2009  . Hypersomnia 08/22/2008  . Psoriasis 08/22/2008  . Hypersteatosis 06/06/2007  . 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 Tobacco abuse 10/24/1968    Past Surgical History:  Procedure Laterality Date  . ELBOW SURGERY    . 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 SKIN CANCER EXCISION    . WRIST SURGERY Right    Trauma resulting in prosthetics in the right wrist and elbow    His family history includes AAA (abdominal aortic aneurysm) in his father; Cancer in his  mother; Heart Problems in his father; Heart attack in his brother; Hypercholesterolemia in his brother; Hypertension in his mother.      Current Outpatient Medications:  .  aspirin (ASPIR-LOW) 81 MG EC tablet, Take 81 mg by mouth daily. , Disp: , Rfl:  .  buPROPion (WELLBUTRIN SR) 150 MG 12 hr tablet, 1 tablet daily for 3 days, then 1 tablet twice daily. Stop smoking 14 days after starting medication (Patient taking differently: Take 150 mg by mouth 2 (two) times daily. 1 tablet daily for 3 days, then 1 tablet twice daily. Stop smoking 14 days after starting medication), Disp: 60 tablet, Rfl: 5 .  glucose blood (ONETOUCH VERIO) test strip, TEST ONCE DAILY, Disp: 100 each, Rfl: 4 .  hydrochlorothiazide (HYDRODIURIL) 50 MG tablet, Take 1 tablet (50 mg total) by mouth daily., Disp: 30 tablet, Rfl: 12 .  indomethacin (INDOCIN) 50 MG capsule, Take 1 capsule 2-3 times daily as needed for pain and swelling., Disp: 30 capsule, Rfl: 0 .  lovastatin (MEVACOR) 40 MG tablet, TAKE 1 TABLET BY MOUTH EVERY DAY, Disp: 30 tablet, Rfl:  0 .  omeprazole (PRILOSEC) 20 MG capsule, TAKE 2 CAPSULES BY MOUTH EVERY DAY (Patient taking differently: TAKE 1 CAPSULE BY MOUTH EVERY DAY), Disp: 180 capsule, Rfl: 4 .  vitamin C (ASCORBIC ACID) 500 MG tablet, Take 500 mg by mouth 2 (two) times daily. , Disp: , Rfl:   Patient Care Team: Birdie Sons, MD as PCP - General (Family Medicine)     Objective:   Vitals:  BP  120/62 (BP Location: Left Arm)     Pulse  92     Temp  99.5 F (37.5 C) (Oral)     Ht  5\' 11"  (1.803 m)     Wt  213 lb 9.6 oz (96.9 kg)      BMI  29.79 kg/m       Physical Exam   General Appearance:    Alert, cooperative, no distress, appears stated age  Head:    Normocephalic, without obvious abnormality, atraumatic  Eyes:    PERRL, conjunctiva/corneas clear, EOM's intact, fundi    benign, both eyes       Ears:    Normal TM's and external ear canals, both ears  Nose:   Nares  normal, septum midline, mucosa normal, no drainage   or sinus tenderness  Throat:   Lips, mucosa, and tongue normal; teeth and gums normal  Neck:   Supple, symmetrical, trachea midline, no adenopathy;       thyroid:  No enlargement/tenderness/nodules; no carotid   bruit or JVD  Back:     Symmetric, no curvature, ROM normal, no CVA tenderness  Lungs:     Clear to auscultation bilaterally, respirations unlabored  Chest wall:    No tenderness or deformity  Heart:    Regular rate and rhythm, S1 and S2 normal, no murmur, rub   or gallop  Abdomen:     Soft, non-tender, bowel sounds active all four quadrants,    no masses, no organomegaly  Genitalia:    deferred  Rectal:    deferred  Extremities:   Extremities normal, atraumatic, no cyanosis or edema  Pulses:   2+ and symmetric all extremities  Skin:   Skin color, texture, turgor normal, no rashes or lesions  Lymph nodes:   Cervical, supraclavicular, and axillary nodes normal  Neurologic:   CNII-XII intact. Normal strength, sensation and reflexes      throughout    Activities of Daily Living In your present state of health, do you have any difficulty performing the following activities: 12/22/2017 03/16/2017  Hearing? N N  Vision? N N  Difficulty concentrating or making decisions? N N  Walking or climbing stairs? N N  Dressing or bathing? N N  Doing errands, shopping? N N  Preparing Food and eating ? N -  Using the Toilet? N -  In the past six months, have you accidently leaked urine? N -  Do you have problems with loss of bowel control? N -  Managing your Medications? N -  Managing your Finances? N -  Housekeeping or managing your Housekeeping? N -  Some recent data might be hidden    Fall Risk Assessment Fall Risk  12/22/2017 12/12/2016 10/08/2015  Falls in the past year? No No No     Depression Screen PHQ 2/9 Scores 12/22/2017 12/22/2017 12/12/2016 10/08/2015  PHQ - 2 Score 0 0 0 0  PHQ- 9 Score 0 - 1 2       Assessment &  Plan:    Annual Physical Reviewed patient's  Family Medical History Reviewed and updated list of patient's medical providers Assessment of cognitive impairment was done Assessed patient's functional ability Established a written schedule for health screening [place] Completed and Reviewed  Exercise Activities and Dietary recommendations Goals    . Exercise 150 min/wk Moderate Activity     Recommend to continue exercising 3 days a week for 1 hour.        Immunization History  Administered Date(s) Administered  . Influenza, High Dose Seasonal PF 10/08/2015, 07/05/2016  . Influenza-Unspecified 12/01/2017  . Pneumococcal Conjugate-13 10/08/2015  . Pneumococcal Polysaccharide-23 09/10/2008, 12/12/2016  . Td 10/24/1997  . Tdap 06/06/2007    Health Maintenance  Topic Date Due  . OPHTHALMOLOGY EXAM  06/07/1960  . FOOT EXAM  10/07/2016  . TETANUS/TDAP  06/05/2017  . HEMOGLOBIN A1C  06/11/2017  . URINE MICROALBUMIN  12/12/2017  . COLONOSCOPY  12/20/2019  . INFLUENZA VACCINE  Completed  . Hepatitis C Screening  Completed  . PNA vac Low Risk Adult  Completed     Discussed health benefits of physical activity, and encouraged him to engage in regular exercise appropriate for his age and condition.    ------------------------------------------------------------------------------------------------------------ 1. Annual physical exam  - Comprehensive metabolic panel - Lipid panel  2. Essential hypertension Well controlled.  Continue current medications.   - Comprehensive metabolic panel  3. Type 2 diabetes mellitus with other circulatory complication, without long-term current use of insulin (HCC)  - Comprehensive metabolic panel - Hemoglobin A1c - POCT UA - Microalbumin  4. Hyperlipidemia, unspecified hyperlipidemia type He is tolerating atorvastatin well with no adverse effects.   - Lipid panel  5. Tubular adenoma history  Colonoscopy due  11/2019  6. Tobacco abuse Counseled regarding risks of tobacco use and benefits of tobacco cessation.   7. Prostate cancer screening  - PSA   Lelon Huh, MD  [facility]

## 2017-12-26 LAB — COMPREHENSIVE METABOLIC PANEL
ALBUMIN: 4.3 g/dL (ref 3.6–4.8)
ALK PHOS: 87 IU/L (ref 39–117)
ALT: 17 IU/L (ref 0–44)
AST: 13 IU/L (ref 0–40)
Albumin/Globulin Ratio: 2.2 (ref 1.2–2.2)
BUN/Creatinine Ratio: 15 (ref 10–24)
BUN: 13 mg/dL (ref 8–27)
Bilirubin Total: 0.3 mg/dL (ref 0.0–1.2)
CO2: 28 mmol/L (ref 20–29)
CREATININE: 0.85 mg/dL (ref 0.76–1.27)
Calcium: 9.4 mg/dL (ref 8.6–10.2)
Chloride: 101 mmol/L (ref 96–106)
GFR calc non Af Amer: 90 mL/min/{1.73_m2} (ref >59)
GFR, EST AFRICAN AMERICAN: 104 mL/min/{1.73_m2} (ref >59)
GLOBULIN, TOTAL: 2 g/dL (ref 1.5–4.5)
Glucose: 118 mg/dL — ABNORMAL HIGH (ref 65–99)
Potassium: 3.9 mmol/L (ref 3.5–5.2)
SODIUM: 145 mmol/L — AB (ref 134–144)
TOTAL PROTEIN: 6.3 g/dL (ref 6.0–8.5)

## 2017-12-26 LAB — PSA: PROSTATE SPECIFIC AG, SERUM: 0.3 ng/mL (ref 0.0–4.0)

## 2017-12-26 LAB — LIPID PANEL
CHOL/HDL RATIO: 6.2 ratio — AB (ref 0.0–5.0)
CHOLESTEROL TOTAL: 181 mg/dL (ref 100–199)
HDL: 29 mg/dL — ABNORMAL LOW (ref >39)
LDL CALC: 122 mg/dL — AB (ref 0–99)
Triglycerides: 152 mg/dL — ABNORMAL HIGH (ref 0–149)
VLDL Cholesterol Cal: 30 mg/dL (ref 5–40)

## 2017-12-26 LAB — HEMOGLOBIN A1C
ESTIMATED AVERAGE GLUCOSE: 148 mg/dL
HEMOGLOBIN A1C: 6.8 % — AB (ref 4.8–5.6)

## 2017-12-27 ENCOUNTER — Telehealth: Payer: Self-pay | Admitting: *Deleted

## 2017-12-27 MED ORDER — ATORVASTATIN CALCIUM 40 MG PO TABS: 40.0000 mg | ORAL_TABLET | Freq: Every day | ORAL | 3 refills | Status: DC

## 2017-12-27 NOTE — Telephone Encounter (Signed)
-----   Message from Birdie Sons, MD sent at 12/26/2017  1:32 PM EST ----- LDL cholesterol is 122. Needs to be under 70 due to diabetes to prevent heart disease. Need to change lovastatin to atorvastatin 40mg  once a day, #30, rf x 3.  A1c is up a bit to 6.8% need to avoid sweets and starch foods. Schedule follow up o.v. For diabetes and lipids in 3 months.

## 2017-12-27 NOTE — Telephone Encounter (Signed)
Patient was notified of results. Expressed understanding. Rx sent to pharmacy. follow appt was scheduled for 03/28/2018 at 1:40 pm.

## 2017-12-29 ENCOUNTER — Other Ambulatory Visit: Payer: Self-pay | Admitting: Family Medicine

## 2017-12-29 DIAGNOSIS — Z720 Tobacco use: Secondary | ICD-10-CM

## 2017-12-29 MED ORDER — BUPROPION HCL ER (SR) 150 MG PO TB12: 150.0000 mg | ORAL_TABLET | Freq: Two times a day (BID) | ORAL | 1 refills | Status: DC

## 2017-12-29 NOTE — Telephone Encounter (Signed)
Pharmacy requesting 90 day supply of medication

## 2017-12-29 NOTE — Telephone Encounter (Signed)
[facility] pharmacy faxed a refill request for a 90-days supply for the following medication. Thanks CC  buPROPion (WELLBUTRIN SR) 150 MG 12 hr tablet

## 2018-01-01 NOTE — Telephone Encounter (Signed)
Visit complete.

## 2018-01-20 ENCOUNTER — Other Ambulatory Visit: Payer: Self-pay | Admitting: Family Medicine

## 2018-01-20 DIAGNOSIS — E785 Hyperlipidemia, unspecified: Secondary | ICD-10-CM

## 2018-01-20 MED ORDER — ATORVASTATIN CALCIUM 40 MG PO TABS: 40.0000 mg | ORAL_TABLET | Freq: Every day | ORAL | 3 refills | Status: DC

## 2018-03-27 NOTE — Progress Notes (Signed)
Patient: Thomas Morgan Male    DOB: 05/26/1951   67 y.o.   MRN: [id number] Visit Date: 03/28/2018  Today's Provider: Lelon Huh, MD   Chief Complaint  Patient presents with  . Diabetes  . Hypertension  . Hyperlipidemia   Subjective:    HPI   Diabetes Mellitus Type II, Follow-up:   Lab Results  Component Value Date   HGBA1C 6.8 (H) 12/25/2017   HGBA1C 6.4 (H) 12/12/2016   HGBA1C 6.3 07/05/2016   Last seen for diabetes 3 months ago.  Management since then includes; labs chekced. Recommended avoiding sweats and starchy foods. He reports good compliance with treatment. He is not having side effects.  Current symptoms include paresthesia of the feet and have been stable. Home blood sugar records: patient unsure of the average readings  Episodes of hypoglycemia? no   Current Insulin Regimen: none Most Recent Eye Exam: >1 year ago Weight trend: decreasing steadily Prior visit with dietician: no Current diet: well balanced Current exercise: cardiovascular workout on exercise equipment and yard work  ------------------------------------------------------------------------   Hypertension, follow-up:  BP Readings from Last 3 Encounters:  03/28/18 118/68  12/22/17 120/62  11/30/17 120/70    He was last seen for hypertension 3 months ago.  BP at that visit was 120/62. Management since that visit includes; no changes.He reports good compliance with treatment. He is not having side effects.  He is exercising. He is adherent to low salt diet.   Outside blood pressures are not being checked. He is experiencing none.  Patient denies chest pain, chest pressure/discomfort, claudication, dyspnea, exertional chest pressure/discomfort, fatigue, irregular heart beat, lower extremity edema, near-syncope, orthopnea, palpitations, paroxysmal nocturnal dyspnea, syncope and tachypnea.   Cardiovascular risk factors include advanced age (older than 54 for men, 6 for women),  diabetes mellitus, dyslipidemia, hypertension and male gender.  Use of agents associated with hypertension: NSAIDS.   ------------------------------------------------------------------------    Lipid/Cholesterol, Follow-up:   Last seen for this 3 months ago.  Management since that visit includes; labs checked. Changed lovastatin to atorvastatin 40 mg qd due to LDL not being at goal.   Last Lipid Panel:    Component Value Date/Time   CHOL 181 12/25/2017 0807   TRIG 152 (H) 12/25/2017 0807   HDL 29 (L) 12/25/2017 0807   CHOLHDL 6.2 (H) 12/25/2017 0807   LDLCALC 122 (H) 12/25/2017 6712    He reports poor compliance with treatment. He is having side effects. Patient stopped taking Atorvastatin after being on it for 2 weeks due to it causing fatigue and muscle aches.   Wt Readings from Last 3 Encounters:  03/28/18 206 lb (93.4 kg)  12/22/17 213 lb 9.6 oz (96.9 kg)  11/30/17 216 lb (98 kg)    ------------------------------------------------------------------------    Allergies  Allergen Reactions  . Crestor [Rosuvastatin] Other (See Comments)    Muscle aches  . Hydrocodone Other (See Comments)    Palpitations, increased heart rate  . Lescol  [Fluvastatin]     Muscle Aches     Current Outpatient Medications:  .  aspirin (ASPIR-LOW) 81 MG EC tablet, Take 81 mg by mouth daily. , Disp: , Rfl:  .  buPROPion (WELLBUTRIN SR) 150 MG 12 hr tablet, Take 1 tablet (150 mg total) by mouth 2 (two) times daily., Disp: 180 tablet, Rfl: 1 .  glucose blood (ONETOUCH VERIO) test strip, TEST ONCE DAILY, Disp: 100 each, Rfl: 4 .  hydrochlorothiazide (HYDRODIURIL) 50 MG tablet, Take 1 tablet (  50 mg total) by mouth daily., Disp: 30 tablet, Rfl: 12 .  omeprazole (PRILOSEC) 20 MG capsule, TAKE 2 CAPSULES BY MOUTH EVERY DAY (Patient taking differently: TAKE 1 CAPSULE BY MOUTH EVERY DAY), Disp: 180 capsule, Rfl: 4 .  vitamin C (ASCORBIC ACID) 500 MG tablet, Take 500 mg by mouth 2 (two) times  daily. , Disp: , Rfl:  .  atorvastatin (LIPITOR) 40 MG tablet, Take 1 tablet (40 mg total) by mouth daily. (Patient not taking: Reported on 03/28/2018), Disp: 30 tablet, Rfl: 3 .  indomethacin (INDOCIN) 50 MG capsule, Take 1 capsule 2-3 times daily as needed for pain and swelling. (Patient not taking: Reported on 03/28/2018), Disp: 30 capsule, Rfl: 0  Review of Systems  Constitutional: Negative for appetite change, chills and fever.  Respiratory: Negative for chest tightness, shortness of breath and wheezing.   Cardiovascular: Negative for chest pain and palpitations.  Gastrointestinal: Negative for abdominal pain, nausea and vomiting.  Neurological: Positive for numbness (in feet and finger tips).    Social History   Tobacco Use  . Smoking status: Smoker, Current Status Unknown    Packs/day: 0.50    Years: 45.00    Pack years: 22.50    Types: Cigarettes  . Smokeless tobacco: Never Used  . Tobacco comment: maybe 2-3 a day currently- trying to quit  Substance Use Topics  . Alcohol use: No    Alcohol/week: 0.0 oz   Objective:   BP 118/68 (BP Location: Left Arm, Patient Position: Sitting, Cuff Size: Large)   Pulse 76   Temp 98.4 F (36.9 C) (Oral)   Resp 16   Wt 206 lb (93.4 kg)   SpO2 96% Comment: room air  BMI 28.73 kg/m  Vitals:   03/28/18 1339  BP: 118/68  Pulse: 76  Resp: 16  Temp: 98.4 F (36.9 C)  TempSrc: Oral  SpO2: 96%  Weight: 206 lb (93.4 kg)     Physical Exam  General Appearance:    Alert, cooperative, no distress  HENT:   Bilateral cerumen impaction  Eyes:    PERRL, conjunctiva/corneas clear, EOM's intact       Lungs:     Clear to auscultation bilaterally, respirations unlabored  Heart:    Regular rate and rhythm  Neurologic:   Awake, alert, oriented x 3. No apparent focal neurological           defect.         Results for orders placed or performed in visit on 03/28/18  POCT HgB A1C  Result Value Ref Range   Hemoglobin A1C 6.8 (A) 4.0 - 5.6 %    HbA1c, POC (prediabetic range)  5.7 - 6.4 %   HbA1c, POC (controlled diabetic range)  0.0 - 7.0 %   Est. average glucose Bld gHb Est-mCnc 148        Assessment & Plan:     1. HLD (hyperlipidemia) Intolerant to high intensity statins, will start back on - lovastatin (MEVACOR) 40 MG tablet; Take 1 tablet (40 mg total) by mouth daily.  Dispense: 90 tablet; Refill: 3 Anticipating adding ezetimibe or Welchol after a month if tolerating.   2. Type 2 diabetes mellitus with other circulatory complication, without long-term current use of insulin ([place]) Well controlled with diet.  - POCT HgB A1C  3. Bilateral impacted cerumen After soaking with Debrox, ear canal was irrigated with water until clear. Patient tolerated procedure well.         Lelon Huh, MD  [facility]  [facility]  

## 2018-03-28 ENCOUNTER — Ambulatory Visit (INDEPENDENT_AMBULATORY_CARE_PROVIDER_SITE_OTHER): Payer: Medicare Other | Admitting: Family Medicine

## 2018-03-28 ENCOUNTER — Encounter: Payer: Self-pay | Admitting: Family Medicine

## 2018-03-28 VITALS — BP 118/68 | HR 76 | Temp 98.4°F | Resp 16 | Wt 206.0 lb

## 2018-03-28 DIAGNOSIS — E1159 Type 2 diabetes mellitus with other circulatory complications: Secondary | ICD-10-CM

## 2018-03-28 DIAGNOSIS — E785 Hyperlipidemia, unspecified: Secondary | ICD-10-CM

## 2018-03-28 DIAGNOSIS — H6123 Impacted cerumen, bilateral: Secondary | ICD-10-CM

## 2018-03-28 LAB — POCT GLYCOSYLATED HEMOGLOBIN (HGB A1C)
ESTIMATED AVERAGE GLUCOSE: 148
Hemoglobin A1C: 6.8 % — AB (ref 4.0–5.6)

## 2018-03-28 MED ORDER — LOVASTATIN 40 MG PO TABS: 40.0000 mg | ORAL_TABLET | Freq: Every day | ORAL | 3 refills | Status: DC

## 2018-03-28 NOTE — Patient Instructions (Addendum)
   Restart lovastatin 40mg  a day   If tolerating, we will call and add ezetimibe after a month   Try using sweet oil drops to ear for itching, dryness, and flaking

## 2018-04-24 ENCOUNTER — Telehealth: Payer: Self-pay | Admitting: Family Medicine

## 2018-04-24 NOTE — Telephone Encounter (Signed)
Please check with patient to see if he tolerating lovastatin better then the atorvastatin. Need to add ezetimibe to get LDL cholesterol down to 70. Can send in prescription for ezetimibe 10mg  daily, #30, rf x 3 and follow up in October as scheduled.

## 2018-04-25 NOTE — Telephone Encounter (Signed)
LMOVM for pt to return call 

## 2018-04-27 MED ORDER — EZETIMIBE 10 MG PO TABS: 10.0000 mg | ORAL_TABLET | Freq: Every day | ORAL | 3 refills | Status: DC

## 2018-04-27 NOTE — Telephone Encounter (Signed)
Pt states he is tolerating medication well. Agrees to add Zetia. Sent to pharmacy. Pt also mentions that his prilosec was increased to 40 mg by ENT.

## 2018-05-02 ENCOUNTER — Other Ambulatory Visit: Payer: Self-pay | Admitting: Family Medicine

## 2018-07-27 ENCOUNTER — Encounter: Payer: Self-pay | Admitting: Family Medicine

## 2018-07-27 ENCOUNTER — Ambulatory Visit: Payer: Medicare Other | Admitting: Family Medicine

## 2018-07-27 VITALS — BP 118/64 | HR 72 | Temp 98.6°F | Resp 16 | Ht 71.0 in | Wt 211.0 lb

## 2018-07-27 DIAGNOSIS — I10 Essential (primary) hypertension: Secondary | ICD-10-CM | POA: Diagnosis not present

## 2018-07-27 DIAGNOSIS — E1159 Type 2 diabetes mellitus with other circulatory complications: Secondary | ICD-10-CM | POA: Diagnosis not present

## 2018-07-27 DIAGNOSIS — Z23 Encounter for immunization: Secondary | ICD-10-CM

## 2018-07-27 DIAGNOSIS — E785 Hyperlipidemia, unspecified: Secondary | ICD-10-CM

## 2018-07-27 NOTE — Progress Notes (Signed)
Patient: Thomas Morgan Male    DOB: 05/02/1951   67 y.o.   MRN: [id number] Visit Date: 07/27/2018  Today's Provider: Lelon Huh, MD   Chief Complaint  Patient presents with  . Hyperlipidemia  . Diabetes   Subjective:    HPI  Lipid/Cholesterol, Follow-up:   Last seen for this4 months ago.  Management changes since that visit include adding ezetimibe 10mg  daily. . Last Lipid Panel:    Component Value Date/Time   CHOL 181 12/25/2017 0807   TRIG 152 (H) 12/25/2017 0807   HDL 29 (L) 12/25/2017 0807   CHOLHDL 6.2 (H) 12/25/2017 0807   LDLCALC 122 (H) 12/25/2017 0102    Risk factors for vascular disease include diabetes mellitus and hypertension  He reports good compliance with treatment. He is not having side effects.  Current symptoms include none and have been stable. Weight trend: stable Prior visit with dietician: no Current diet: well balanced Current exercise: no regular exercise  Wt Readings from Last 3 Encounters:  07/27/18 211 lb (95.7 kg)  03/28/18 206 lb (93.4 kg)  12/22/17 213 lb 9.6 oz (96.9 kg)       Diabetes Mellitus Type II, Follow-up:   Lab Results  Component Value Date   HGBA1C 6.8 (A) 03/28/2018   HGBA1C 6.8 (H) 12/25/2017   HGBA1C 6.4 (H) 12/12/2016    Last seen for diabetes 4 months ago.  Management since then includes no changes. He reports good compliance with treatment. He is not having side effects.  Current symptoms include none and have been stable. Home blood sugar records: fasting range: 110s  Episodes of hypoglycemia? no   Current Insulin Regimen: none Most Recent Eye Exam: due Weight trend: stable Prior visit with dietician: no Current diet: well balanced Current exercise: no regular exercise  Pertinent Labs:    Component Value Date/Time   CHOL 181 12/25/2017 0807   TRIG 152 (H) 12/25/2017 0807   HDL 29 (L) 12/25/2017 0807   LDLCALC 122 (H) 12/25/2017 0807   CREATININE 0.85 12/25/2017 0807     Allergies  Allergen Reactions  . Atorvastatin     Aching and feeling down  . Crestor [Rosuvastatin] Other (See Comments)    Muscle aches  . Hydrocodone Other (See Comments)    Palpitations, increased heart rate  . Lescol  [Fluvastatin]     Muscle Aches     Current Outpatient Medications:  .  aspirin (ASPIR-LOW) 81 MG EC tablet, Take 81 mg by mouth daily. , Disp: , Rfl:  .  buPROPion (WELLBUTRIN SR) 150 MG 12 hr tablet, Take 1 tablet (150 mg total) by mouth 2 (two) times daily., Disp: 180 tablet, Rfl: 1 .  ezetimibe (ZETIA) 10 MG tablet, Take 1 tablet (10 mg total) by mouth daily., Disp: 30 tablet, Rfl: 3 .  glucose blood (ONETOUCH VERIO) test strip, TEST ONCE DAILY, Disp: 100 each, Rfl: 4 .  hydrochlorothiazide (HYDRODIURIL) 50 MG tablet, Take 1 tablet (50 mg total) by mouth daily., Disp: 30 tablet, Rfl: 12 .  lovastatin (MEVACOR) 40 MG tablet, Take 1 tablet (40 mg total) by mouth daily., Disp: 90 tablet, Rfl: 3 .  omeprazole (PRILOSEC) 20 MG capsule, TAKE 2 CAPSULES BY MOUTH EVERY DAY (Patient taking differently: take 40mg  daily), Disp: 180 capsule, Rfl: 4 .  vitamin C (ASCORBIC ACID) 500 MG tablet, Take 500 mg by mouth 2 (two) times daily. , Disp: , Rfl:   Review of Systems  Constitutional: Negative for appetite  change, chills and fever.  Respiratory: Negative for chest tightness, shortness of breath and wheezing.   Cardiovascular: Negative for chest pain and palpitations.  Gastrointestinal: Negative for abdominal pain, nausea and vomiting.    Social History   Tobacco Use  . Smoking status: Smoker, Current Status Unknown    Packs/day: 0.50    Years: 45.00    Pack years: 22.50    Types: Cigarettes  . Smokeless tobacco: Never Used  . Tobacco comment: maybe 2-3 a day currently- trying to quit  Substance Use Topics  . Alcohol use: No    Alcohol/week: 0.0 standard drinks   Objective:   BP 118/64 (BP Location: Right Arm, Patient Position: Sitting, Cuff Size: Normal)    Pulse 72   Temp 98.6 F (37 C)   Resp 16   Ht 5\' 11"  (1.803 m)   Wt 211 lb (95.7 kg)   SpO2 96%   BMI 29.43 kg/m  Vitals:   07/27/18 1442  BP: 118/64  Pulse: 72  Resp: 16  Temp: 98.6 F (37 C)  SpO2: 96%  Weight: 211 lb (95.7 kg)  Height: 5\' 11"  (1.803 m)     Physical Exam   General Appearance:    Alert, cooperative, no distress  Eyes:    PERRL, conjunctiva/corneas clear, EOM's intact       Lungs:     Clear to auscultation bilaterally, respirations unlabored  Heart:    Regular rate and rhythm  Neurologic:   Awake, alert, oriented x 3. No apparent focal neurological           defect.           Assessment & Plan:     1. Hyperlipidemia, unspecified hyperlipidemia type Doing well with addition of ezetimibe to lovastatin - Comprehensive metabolic panel - Lipid panel  2. Type 2 diabetes mellitus with other circulatory complication, without long-term current use of insulin (HCC) Diet controlled.  - Hemoglobin A1c  3. Essential hypertension Well controlled.  Continue current medications.    4. Flu vaccine need  - Flu vaccine HIGH DOSE PF (Fluzone High dose)       Lelon Huh, MD  [facility]

## 2018-08-01 LAB — HEMOGLOBIN A1C
Est. average glucose Bld gHb Est-mCnc: 146 mg/dL
HEMOGLOBIN A1C: 6.7 % — AB (ref 4.8–5.6)

## 2018-08-01 LAB — COMPREHENSIVE METABOLIC PANEL
ALT: 17 IU/L (ref 0–44)
AST: 13 IU/L (ref 0–40)
Albumin/Globulin Ratio: 2.5 — ABNORMAL HIGH (ref 1.2–2.2)
Albumin: 4.3 g/dL (ref 3.6–4.8)
Alkaline Phosphatase: 82 IU/L (ref 39–117)
BUN/Creatinine Ratio: 12 (ref 10–24)
BUN: 10 mg/dL (ref 8–27)
Bilirubin Total: 0.5 mg/dL (ref 0.0–1.2)
CALCIUM: 9.3 mg/dL (ref 8.6–10.2)
CO2: 27 mmol/L (ref 20–29)
Chloride: 101 mmol/L (ref 96–106)
Creatinine, Ser: 0.83 mg/dL (ref 0.76–1.27)
GFR, EST AFRICAN AMERICAN: 105 mL/min/{1.73_m2} (ref >59)
GFR, EST NON AFRICAN AMERICAN: 90 mL/min/{1.73_m2} (ref >59)
GLOBULIN, TOTAL: 1.7 g/dL (ref 1.5–4.5)
Glucose: 116 mg/dL — ABNORMAL HIGH (ref 65–99)
Potassium: 3.9 mmol/L (ref 3.5–5.2)
SODIUM: 143 mmol/L (ref 134–144)
TOTAL PROTEIN: 6 g/dL (ref 6.0–8.5)

## 2018-08-01 LAB — LIPID PANEL
Chol/HDL Ratio: 5.6 ratio — ABNORMAL HIGH (ref 0.0–5.0)
Cholesterol, Total: 158 mg/dL (ref 100–199)
HDL: 28 mg/dL — ABNORMAL LOW (ref >39)
LDL Calculated: 94 mg/dL (ref 0–99)
Triglycerides: 180 mg/dL — ABNORMAL HIGH (ref 0–149)
VLDL Cholesterol Cal: 36 mg/dL (ref 5–40)

## 2018-08-31 ENCOUNTER — Other Ambulatory Visit: Payer: Self-pay | Admitting: Family Medicine

## 2018-09-11 ENCOUNTER — Other Ambulatory Visit: Payer: Self-pay | Admitting: Family Medicine

## 2018-09-11 DIAGNOSIS — I10 Essential (primary) hypertension: Secondary | ICD-10-CM

## 2018-10-24 ENCOUNTER — Telehealth: Payer: Self-pay

## 2018-10-24 HISTORY — PX: CYST REMOVAL TRUNK: SHX6283

## 2018-10-24 HISTORY — PX: BASAL CELL CARCINOMA EXCISION: SHX1214

## 2018-10-24 NOTE — Telephone Encounter (Signed)
Call pt regarding lung screening. Pt is a current smoker. Pt smokes about 1 pack per day, scan can be any day and afternoon. Pt has no new health issues.

## 2018-10-25 ENCOUNTER — Telehealth: Payer: Self-pay | Admitting: *Deleted

## 2018-10-25 ENCOUNTER — Encounter: Payer: Self-pay | Admitting: *Deleted

## 2018-10-25 DIAGNOSIS — Z122 Encounter for screening for malignant neoplasm of respiratory organs: Secondary | ICD-10-CM

## 2018-10-25 NOTE — Telephone Encounter (Signed)
Patient has been notified that the annual lung cancer screening low dose CT scan is due currently or will be in the near future.  Confirmed that the patient is within the age range of 73-80, and asymptomatic, and currently exhibits no signs or symptoms of lung cancer.  Patient denies illness that would prevent curative treatment for lung cancer if found.  Verified smoking history, current smoker 1 ppd for 48 years with 48pkyr history .  The shared decision making visit was completed on 10-23-15.  Patient is agreeable for the CT scan to be scheduled.  Will call patient back with date and time of appointment.

## 2018-10-26 ENCOUNTER — Telehealth: Payer: Self-pay | Admitting: *Deleted

## 2018-10-26 NOTE — Telephone Encounter (Signed)
Called pt to inform him of his appt for ldct screening on Monday 11/12/18 @ 1:15pm here @ [facility], pt voiced understanding and reported that he had saw it on his my chart account.

## 2018-10-30 ENCOUNTER — Encounter: Payer: Self-pay | Admitting: Family Medicine

## 2018-10-30 ENCOUNTER — Ambulatory Visit: Payer: Medicare Other | Admitting: Family Medicine

## 2018-10-30 ENCOUNTER — Other Ambulatory Visit: Payer: Self-pay

## 2018-10-30 VITALS — BP 140/90 | HR 68 | Temp 97.6°F | Ht 70.5 in | Wt 218.4 lb

## 2018-10-30 DIAGNOSIS — H6982 Other specified disorders of Eustachian tube, left ear: Secondary | ICD-10-CM

## 2018-10-30 DIAGNOSIS — J069 Acute upper respiratory infection, unspecified: Secondary | ICD-10-CM | POA: Diagnosis not present

## 2018-10-30 NOTE — Patient Instructions (Signed)
Discussed use or Mucinex D and start steroid nasal spray. Let me know if your sinuses do not continue to improve.

## 2018-10-30 NOTE — Progress Notes (Signed)
  Subjective:     Patient ID: LELA GELL, male   DOB: Mar 27, 1951, 68 y.o.   MRN: [id number] Chief Complaint  Patient presents with  . Ear Pain    goes down left side of neck   HPI States he developed cold sx about a week ago. Reports mild improvement. Sinus drainage and sputum are clear. Has been using Mucinex D and [place] for his sx. No change in hearing.  Review of Systems     Objective:   Physical Exam Constitutional:      Appearance: He is not ill-appearing.  Neurological:     Mental Status: He is alert.   Ears: T.M's intact without inflammation. Left TM with dull, diffuse light reflex.(patient discomfort located down his neck from the bottom of his left ear) Throat: no tonsillar enlargement or exudate; + posterior pharyngeal erythma Neck: bilateral anterior cervical nodes  Lungs: clear     Assessment:    1. URI, acute  2. Acute dysfunction of left eustachian tube    Plan:   Discussed use of Mucinex D and starting steroid nasal spray. Call if neck discomfort or sinuses not continuing to improve.

## 2018-11-12 ENCOUNTER — Ambulatory Visit
Admission: RE | Admit: 2018-11-12 | Discharge: 2018-11-12 | Disposition: A | Discharge status: Home / Self Care | Payer: Medicare Other | Source: Ambulatory Visit | Attending: Oncology | Admitting: Oncology

## 2018-11-12 ENCOUNTER — Encounter: Payer: Self-pay | Admitting: *Deleted

## 2018-11-12 DIAGNOSIS — Z122 Encounter for screening for malignant neoplasm of respiratory organs: Secondary | ICD-10-CM

## 2019-01-22 ENCOUNTER — Encounter: Payer: Medicare Other | Admitting: Family Medicine

## 2019-01-22 ENCOUNTER — Ambulatory Visit: Payer: Medicare Other

## 2019-01-25 ENCOUNTER — Ambulatory Visit: Payer: Medicare Other

## 2019-01-25 ENCOUNTER — Encounter: Payer: Medicare Other | Admitting: Family Medicine

## 2019-04-02 ENCOUNTER — Ambulatory Visit (INDEPENDENT_AMBULATORY_CARE_PROVIDER_SITE_OTHER): Payer: Medicare Other

## 2019-04-02 ENCOUNTER — Other Ambulatory Visit: Payer: Self-pay

## 2019-04-02 DIAGNOSIS — Z0000 Encounter for general adult medical examination without abnormal findings: Secondary | ICD-10-CM

## 2019-04-02 NOTE — Progress Notes (Addendum)
Subjective:   Thomas Morgan is a 68 y.o. male who presents for Medicare Annual/Subsequent preventive examination.    This visit is being conducted through telemedicine due to the COVID-19 pandemic. This patient has given me verbal consent via doximity to conduct this visit, patient states they are participating from their home address. Some vital signs may be absent or patient reported.    Patient identification: identified by name, DOB, and current address  Review of Systems:   N/A  Cardiac Risk Factors include: advanced age (>73men, >23 women);diabetes mellitus;dyslipidemia;hypertension;male gender;smoking/ tobacco exposure     Objective:    Vitals: There were no vitals taken for this visit.  There is no height or weight on file to calculate BMI. Unable to obtain vitals due to visit being conducted via telephonically.   Advanced Directives 04/02/2019 12/22/2017 03/16/2017  Does Patient Have a Medical Advance Directive? Yes Yes Yes  Type of Paramedic of [place];Living will [place];Living will [place];Living will  Does patient want to make changes to medical advance directive? - - No - Patient declined  Copy of [place] in Chart? No - copy requested No - copy requested -    Tobacco Social History   Tobacco Use  Smoking Status Current Every Day Smoker  . Packs/day: 1.00  . Years: 48.00  . Pack years: 48.00  . Types: Cigarettes  Smokeless Tobacco Never Used     Ready to quit: No Counseling given: No   Clinical Intake:  Pre-visit preparation completed: Yes  Pain : No/denies pain Pain Score: 0-No pain     Nutritional Status: BMI 25 -29 Overweight Nutritional Risks: None Diabetes: Yes  How often do you need to have someone help you when you read instructions, pamphlets, or other written materials from your doctor or pharmacy?: 1 - Never   Diabetes:  Is the patient diabetic?   Yes  prediabetic If diabetic, was a CBG obtained today?  No  Did the patient bring in their glucometer from home?  No  How often do you monitor your CBG's? Occasionally.   Financial Strains and Diabetes Management:  Are you having any financial strains with the device, your supplies or your medication? No .  Does the patient want to be seen by Chronic Care Management for management of their diabetes?  No  Would the patient like to be referred to a Nutritionist or for Diabetic Management?  No   Diabetic Exams:  Diabetic Eye Exam: Completed 07/23/18. Complete yearly.   Diabetic Foot Exam: Completed 10/07/18. Pt has been advised about the importance in completing this exam.  Note made to f/u on this at next in office visit.    Interpreter Needed?: No  Information entered by :: [facility], LPN  Past Medical History:  Diagnosis Date  . Cancer ([place])    basal cell  . Complication of anesthesia   . GERD (gastroesophageal reflux disease)   . High cholesterol   . Hypertension   . Personal history of tobacco use, presenting hazards to health 10/22/2015  . PONV (postoperative nausea and vomiting)    gets very sick from anesthesia  . Pre-diabetes    Past Surgical History:  Procedure Laterality Date  . ELBOW SURGERY    . 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 SKIN CANCER EXCISION    .  WRIST SURGERY Right    Trauma resulting in prosthetics in the right wrist and elbow   Family History  Problem Relation Age of Onset  . Cancer Mother        Lung  . Hypertension Mother   . Heart Problems Father   . AAA (abdominal aortic aneurysm) Father   . Heart attack Brother   . Hypercholesterolemia Brother    Social History   Socioeconomic History  . Marital status: Married    Spouse name: Not on file  . Number of children: 2  . Years of education: Not on file   . Highest education level: Bachelor's degree (e.g., BA, AB, BS)  Occupational History  . Not on file  Social Needs  . Financial resource strain: Not hard at all  . Food insecurity:    Worry: Never true    Inability: Never true  . Transportation needs:    Medical: No    Non-medical: No  Tobacco Use  . Smoking status: Current Every Day Smoker    Packs/day: 1.00    Years: 48.00    Pack years: 48.00    Types: Cigarettes  . Smokeless tobacco: Never Used  Substance and Sexual Activity  . Alcohol use: No    Alcohol/week: 0.0 standard drinks  . Drug use: No  . Sexual activity: Not on file  Lifestyle  . Physical activity:    Days per week: 0 days    Minutes per session: 0 min  . Stress: Not at all  Relationships  . Social connections:    Talks on phone: Patient refused    Gets together: Patient refused    Attends religious service: Patient refused    Active member of club or organization: Patient refused    Attends meetings of clubs or organizations: Patient refused    Relationship status: Patient refused  Other Topics Concern  . Not on file  Social History Narrative  . Not on file    Outpatient Encounter Medications as of 04/02/2019  Medication Sig  . aspirin (ASPIR-LOW) 81 MG EC tablet Take 81 mg by mouth daily.   Marland Kitchen ezetimibe (ZETIA) 10 MG tablet TAKE 1 TABLET BY MOUTH EVERY DAY  . glucose blood (ONETOUCH VERIO) test strip TEST ONCE DAILY  . hydrochlorothiazide (HYDRODIURIL) 50 MG tablet TAKE 1 TABLET BY MOUTH EVERY DAY  . lovastatin (MEVACOR) 40 MG tablet Take 1 tablet (40 mg total) by mouth daily.  Marland Kitchen omeprazole (PRILOSEC) 20 MG capsule TAKE 2 CAPSULES BY MOUTH EVERY DAY (Patient taking differently: take 40mg  daily)  . vitamin C (ASCORBIC ACID) 500 MG tablet Take 500 mg by mouth 2 (two) times daily.   Marland Kitchen buPROPion (WELLBUTRIN SR) 150 MG 12 hr tablet Take 1 tablet (150 mg total) by mouth 2 (two) times daily. (Patient not taking: Reported on 04/02/2019)   No  facility-administered encounter medications on file as of 04/02/2019.     Activities of Daily Living In your present state of health, do you have any difficulty performing the following activities: 04/02/2019  Hearing? N  Vision? N  Difficulty concentrating or making decisions? N  Walking or climbing stairs? N  Dressing or bathing? N  Doing errands, shopping? N  Preparing Food and eating ? N  Using the Toilet? N  In the past six months, have you accidently leaked urine? N  Do you have problems with loss of bowel control? N  Managing your Medications? N  Managing your Finances? N  Housekeeping or managing your  Housekeeping? N  Some recent data might be hidden    Patient Care Team: Birdie Sons, MD as PCP - General (Family Medicine) Marlowe Sax, MD as Referring Physician (Rheumatology) Sharlotte Alamo, DPM (Podiatry)   Assessment:   This is a routine wellness examination for [place].  Exercise Activities and Dietary recommendations Current Exercise Habits: The patient does not participate in regular exercise at present, Exercise limited by: orthopedic condition(s)  Goals    . Exercise 150 min/wk Moderate Activity     Recommend to continue exercising 3 days a week for 1 hour.        Fall Risk Fall Risk  04/02/2019 10/30/2018 12/22/2017 12/12/2016 10/08/2015  Falls in the past year? 0 0 No No No   FALL RISK PREVENTION PERTAINING TO THE HOME:  Any stairs in or around the home? Yes  If so, are there any without handrails? No   Home free of loose throw rugs in walkways, pet beds, electrical cords, etc? Yes  Adequate lighting in your home to reduce risk of falls? Yes   ASSISTIVE DEVICES UTILIZED TO PREVENT FALLS:  Life alert? No  Use of a cane, walker or w/c? No  Grab bars in the bathroom? No  Shower chair or bench in shower? No  Elevated toilet seat or a handicapped toilet? No    TIMED UP AND GO:  Was the test performed? No .    Depression Screen PHQ 2/9  Scores 04/02/2019 10/30/2018 12/22/2017 12/22/2017  PHQ - 2 Score 0 0 0 0  PHQ- 9 Score - - 0 -    Cognitive Function     6CIT Screen 04/02/2019  What Year? 0 points  What month? 0 points  What time? 0 points  Count back from 20 0 points  Months in reverse 0 points  Repeat phrase 2 points  Total Score 2    Immunization History  Administered Date(s) Administered  . Influenza, High Dose Seasonal PF 10/08/2015, 07/05/2016, 07/27/2018  . Influenza-Unspecified 12/01/2017  . Pneumococcal Conjugate-13 10/08/2015  . Pneumococcal Polysaccharide-23 09/10/2008, 12/12/2016  . Td 10/24/1997  . Tdap 06/06/2007    Qualifies for Shingles Vaccine? Yes . Due for Shingrix. Education has been provided regarding the importance of this vaccine. Pt has been advised to call insurance company to determine out of pocket expense. Advised may also receive vaccine at local pharmacy or Health Dept. Verbalized acceptance and understanding.  Tdap: Although this vaccine is not a covered service during a Wellness Exam, does the patient still wish to receive this vaccine today?  No . Advised may receive this vaccine at local pharmacy or Health Dept. Aware to provide a copy of the vaccination record if obtained from local pharmacy or Health Dept. Verbalized acceptance and understanding.  Flu Vaccine: Up to date  Pneumococcal Vaccine: Completed series  Screening Tests Health Maintenance  Topic Date Due  . FOOT EXAM  10/07/2016  . TETANUS/TDAP  06/05/2017  . URINE MICROALBUMIN  12/23/2018  . HEMOGLOBIN A1C  01/30/2019  . INFLUENZA VACCINE  05/25/2019  . OPHTHALMOLOGY EXAM  07/24/2019  . COLONOSCOPY  12/20/2019  . Hepatitis C Screening  Completed  . PNA vac Low Risk Adult  Completed   Cancer Screenings:  Colorectal Screening: Completed 12/19/14. Repeat every 5 years.  Lung Cancer Screening: (Low Dose CT Chest recommended if Age 79-80 years, 30 pack-year currently smoking OR have quit w/in 15years.) does qualify  however had this completed 11/12/18.   Additional Screening:  Hepatitis C Screening: Up  to date  Dental Screening: Recommended annual dental exams for proper oral hygiene  Community Resource Referral:  CRR required this visit?  No        Plan:  I have personally reviewed and addressed the Medicare Annual Wellness questionnaire and have noted the following in the patient's chart:  A. Medical and social history B. Use of alcohol, tobacco or illicit drugs  C. Current medications and supplements D. Functional ability and status E.  Nutritional status F.  Physical activity G. Advance directives H. List of other physicians I.  Hospitalizations, surgeries, and ER visits in previous 12 months J.  [place] such as hearing and vision if needed, cognitive and depression L. Referrals and appointments   In addition, I have reviewed and discussed with patient certain preventive protocols, quality metrics, and best practice recommendations. A written personalized care plan for preventive services as well as general preventive health recommendations were provided to patient.   Glendora Score, [place]  01/30/1790 Nurse Health Advisor  Nurse Notes: Pt needs a diabetic foot exam, urine check and Hgb A1c check at next in office visit.

## 2019-04-02 NOTE — Patient Instructions (Addendum)
[name] , Thank you for taking time to come for your Medicare Wellness Visit. I appreciate your ongoing commitment to your health goals. Please review the following plan we discussed and let me know if I can assist you in the future.   Screening recommendations/referrals: Colonoscopy: Up to date, due 11/2019 Recommended yearly ophthalmology/optometry visit for glaucoma screening and checkup Recommended yearly dental visit for hygiene and checkup  Vaccinations: Influenza vaccine: Up to date Pneumococcal vaccine: Completed series Tdap vaccine: Pt declines today.  Shingles vaccine: Pt declines today.     Advanced directives: Please bring a copy of your POA (Power of Attorney) and/or Living Will to your next appointment.   Conditions/risks identified: Recommend to start back to exercising for 3 days a week for at least 30 minutes at a time once back is healed.   Next appointment: 04/08/19 @ 2:00 PM with Dr Caryn Section.   Preventive Care 69 Years and Older, Male Preventive care refers to lifestyle choices and visits with your health care provider that can promote health and wellness. What does preventive care include?  A yearly physical exam. This is also called an annual well check.  Dental exams once or twice a year.  Routine eye exams. Ask your health care provider how often you should have your eyes checked.  Personal lifestyle choices, including:  Daily care of your teeth and gums.  Regular physical activity.  Eating a healthy diet.  Avoiding tobacco and drug use.  Limiting alcohol use.  Practicing safe sex.  Taking low doses of aspirin every day.  Taking vitamin and mineral supplements as recommended by your health care provider. What happens during an annual well check? The services and screenings done by your health care provider during your annual well check will depend on your age, overall health, lifestyle risk factors, and family history of disease. Counseling   Your health care provider may ask you questions about your:  Alcohol use.  Tobacco use.  Drug use.  Emotional well-being.  Home and relationship well-being.  Sexual activity.  Eating habits.  History of falls.  Memory and ability to understand (cognition).  Work and work Statistician. Screening  You may have the following tests or measurements:  Height, weight, and BMI.  Blood pressure.  Lipid and cholesterol levels. These may be checked every 5 years, or more frequently if you are over 45 years old.  Skin check.  Lung cancer screening. You may have this screening every year starting at age 70 if you have a 30-pack-year history of smoking and currently smoke or have quit within the past 15 years.  Fecal occult blood test (FOBT) of the stool. You may have this test every year starting at age 51.  Flexible sigmoidoscopy or colonoscopy. You may have a sigmoidoscopy every 5 years or a colonoscopy every 10 years starting at age 95.  Prostate cancer screening. Recommendations will vary depending on your family history and other risks.  Hepatitis C blood test.  Hepatitis B blood test.  Sexually transmitted disease (STD) testing.  Diabetes screening. This is done by checking your blood sugar (glucose) after you have not eaten for a while (fasting). You may have this done every 1-3 years.  Abdominal aortic aneurysm (AAA) screening. You may need this if you are a current or former smoker.  Osteoporosis. You may be screened starting at age 46 if you are at high risk. Talk with your health care provider about your test results, treatment options, and if necessary, the  need for more tests. Vaccines  Your health care provider may recommend certain vaccines, such as:  Influenza vaccine. This is recommended every year.  Tetanus, diphtheria, and acellular pertussis (Tdap, Td) vaccine. You may need a Td booster every 10 years.  Zoster vaccine. You may need this after age 54.   Pneumococcal 13-valent conjugate (PCV13) vaccine. One dose is recommended after age 60.  Pneumococcal polysaccharide (PPSV23) vaccine. One dose is recommended after age 48. Talk to your health care provider about which screenings and vaccines you need and how often you need them. This information is not intended to replace advice given to you by your health care provider. Make sure you discuss any questions you have with your health care provider. Document Released: 11/06/2015 Document Revised: 06/29/2016 Document Reviewed: 08/11/2015 Elsevier Interactive Patient Education  2017 [place] Prevention in the Home Falls can cause injuries. They can happen to people of all ages. There are many things you can do to make your home safe and to help prevent falls. What can I do on the outside of my home?  Regularly fix the edges of walkways and driveways and fix any cracks.  Remove anything that might make you trip as you walk through a door, such as a raised step or threshold.  Trim any bushes or trees on the path to your home.  Use bright outdoor lighting.  Clear any walking paths of anything that might make someone trip, such as rocks or tools.  Regularly check to see if handrails are loose or broken. Make sure that both sides of any steps have handrails.  Any raised decks and porches should have guardrails on the edges.  Have any leaves, snow, or ice cleared regularly.  Use sand or salt on walking paths during winter.  Clean up any spills in your garage right away. This includes oil or grease spills. What can I do in the bathroom?  Use night lights.  Install grab bars by the toilet and in the tub and shower. Do not use towel bars as grab bars.  Use non-skid mats or decals in the tub or shower.  If you need to sit down in the shower, use a plastic, non-slip stool.  Keep the floor dry. Clean up any water that spills on the floor as soon as it happens.  Remove soap  buildup in the tub or shower regularly.  Attach bath mats securely with double-sided non-slip rug tape.  Do not have throw rugs and other things on the floor that can make you trip. What can I do in the bedroom?  Use night lights.  Make sure that you have a light by your bed that is easy to reach.  Do not use any sheets or blankets that are too big for your bed. They should not hang down onto the floor.  Have a firm chair that has side arms. You can use this for support while you get dressed.  Do not have throw rugs and other things on the floor that can make you trip. What can I do in the kitchen?  Clean up any spills right away.  Avoid walking on wet floors.  Keep items that you use a lot in easy-to-reach places.  If you need to reach something above you, use a strong step stool that has a grab bar.  Keep electrical cords out of the way.  Do not use floor polish or wax that makes floors slippery. If you must use wax,  use non-skid floor wax.  Do not have throw rugs and other things on the floor that can make you trip. What can I do with my stairs?  Do not leave any items on the stairs.  Make sure that there are handrails on both sides of the stairs and use them. Fix handrails that are broken or loose. Make sure that handrails are as long as the stairways.  Check any carpeting to make sure that it is firmly attached to the stairs. Fix any carpet that is loose or worn.  Avoid having throw rugs at the top or bottom of the stairs. If you do have throw rugs, attach them to the floor with carpet tape.  Make sure that you have a light switch at the top of the stairs and the bottom of the stairs. If you do not have them, ask someone to add them for you. What else can I do to help prevent falls?  Wear shoes that:  Do not have high heels.  Have rubber bottoms.  Are comfortable and fit you well.  Are closed at the toe. Do not wear sandals.  If you use a stepladder:  Make  sure that it is fully opened. Do not climb a closed stepladder.  Make sure that both sides of the stepladder are locked into place.  Ask someone to hold it for you, if possible.  Clearly mark and make sure that you can see:  Any grab bars or handrails.  First and last steps.  Where the edge of each step is.  Use tools that help you move around (mobility aids) if they are needed. These include:  Canes.  Walkers.  Scooters.  Crutches.  Turn on the lights when you go into a dark area. Replace any light bulbs as soon as they burn out.  Set up your furniture so you have a clear path. Avoid moving your furniture around.  If any of your floors are uneven, fix them.  If there are any pets around you, be aware of where they are.  Review your medicines with your doctor. Some medicines can make you feel dizzy. This can increase your chance of falling. Ask your doctor what other things that you can do to help prevent falls. This information is not intended to replace advice given to you by your health care provider. Make sure you discuss any questions you have with your health care provider. Document Released: 08/06/2009 Document Revised: 03/17/2016 Document Reviewed: 11/14/2014 Elsevier Interactive Patient Education  2017 [employer].

## 2019-04-02 NOTE — Patient Instructions (Signed)
Thomas Morgan , Thank you for taking time to come for your Medicare Wellness Visit. I appreciate your ongoing commitment to your health goals. Please review the following plan we discussed and let me know if I can assist you in the future.   Screening recommendations/referrals: Colonoscopy: Up to date, due 11/2019 Recommended yearly ophthalmology/optometry visit for glaucoma screening and checkup Recommended yearly dental visit for hygiene and checkup  Vaccinations: Influenza vaccine: Up to date Pneumococcal vaccine: Completed series Tdap vaccine: Pt declines today.  Shingles vaccine: Pt declines today.     Advanced directives: Please bring a copy of your POA (Power of Attorney) and/or Living Will to your next appointment.   Conditions/risks identified: Recommend to start back exercising 3 days a week for 30 minutes at a time once back is healed.   Next appointment: 04/08/19 @ 2:00 PM with Dr Caryn Section.   Preventive Care 36 Years and Older, Male Preventive care refers to lifestyle choices and visits with your health care provider that can promote health and wellness. What does preventive care include?  A yearly physical exam. This is also called an annual well check.  Dental exams once or twice a year.  Routine eye exams. Ask your health care provider how often you should have your eyes checked.  Personal lifestyle choices, including:  Daily care of your teeth and gums.  Regular physical activity.  Eating a healthy diet.  Avoiding tobacco and drug use.  Limiting alcohol use.  Practicing safe sex.  Taking low doses of aspirin every day.  Taking vitamin and mineral supplements as recommended by your health care provider. What happens during an annual well check? The services and screenings done by your health care provider during your annual well check will depend on your age, overall health, lifestyle risk factors, and family history of disease. Counseling  Your health care  provider may ask you questions about your:  Alcohol use.  Tobacco use.  Drug use.  Emotional well-being.  Home and relationship well-being.  Sexual activity.  Eating habits.  History of falls.  Memory and ability to understand (cognition).  Work and work Statistician. Screening  You may have the following tests or measurements:  Height, weight, and BMI.  Blood pressure.  Lipid and cholesterol levels. These may be checked every 5 years, or more frequently if you are over 60 years old.  Skin check.  Lung cancer screening. You may have this screening every year starting at age 20 if you have a 30-pack-year history of smoking and currently smoke or have quit within the past 15 years.  Fecal occult blood test (FOBT) of the stool. You may have this test every year starting at age 97.  Flexible sigmoidoscopy or colonoscopy. You may have a sigmoidoscopy every 5 years or a colonoscopy every 10 years starting at age 75.  Prostate cancer screening. Recommendations will vary depending on your family history and other risks.  Hepatitis C blood test.  Hepatitis B blood test.  Sexually transmitted disease (STD) testing.  Diabetes screening. This is done by checking your blood sugar (glucose) after you have not eaten for a while (fasting). You may have this done every 1-3 years.  Abdominal aortic aneurysm (AAA) screening. You may need this if you are a current or former smoker.  Osteoporosis. You may be screened starting at age 66 if you are at high risk. Talk with your health care provider about your test results, treatment options, and if necessary, the need for more tests.  Vaccines  Your health care provider may recommend certain vaccines, such as:  Influenza vaccine. This is recommended every year.  Tetanus, diphtheria, and acellular pertussis (Tdap, Td) vaccine. You may need a Td booster every 10 years.  Zoster vaccine. You may need this after age 83.  Pneumococcal  13-valent conjugate (PCV13) vaccine. One dose is recommended after age 79.  Pneumococcal polysaccharide (PPSV23) vaccine. One dose is recommended after age 55. Talk to your health care provider about which screenings and vaccines you need and how often you need them. This information is not intended to replace advice given to you by your health care provider. Make sure you discuss any questions you have with your health care provider. Document Released: 11/06/2015 Document Revised: 06/29/2016 Document Reviewed: 08/11/2015 Elsevier Interactive Patient Education  2017 [place] Prevention in the Home Falls can cause injuries. They can happen to people of all ages. There are many things you can do to make your home safe and to help prevent falls. What can I do on the outside of my home?  Regularly fix the edges of walkways and driveways and fix any cracks.  Remove anything that might make you trip as you walk through a door, such as a raised step or threshold.  Trim any bushes or trees on the path to your home.  Use bright outdoor lighting.  Clear any walking paths of anything that might make someone trip, such as rocks or tools.  Regularly check to see if handrails are loose or broken. Make sure that both sides of any steps have handrails.  Any raised decks and porches should have guardrails on the edges.  Have any leaves, snow, or ice cleared regularly.  Use sand or salt on walking paths during winter.  Clean up any spills in your garage right away. This includes oil or grease spills. What can I do in the bathroom?  Use night lights.  Install grab bars by the toilet and in the tub and shower. Do not use towel bars as grab bars.  Use non-skid mats or decals in the tub or shower.  If you need to sit down in the shower, use a plastic, non-slip stool.  Keep the floor dry. Clean up any water that spills on the floor as soon as it happens.  Remove soap buildup in the  tub or shower regularly.  Attach bath mats securely with double-sided non-slip rug tape.  Do not have throw rugs and other things on the floor that can make you trip. What can I do in the bedroom?  Use night lights.  Make sure that you have a light by your bed that is easy to reach.  Do not use any sheets or blankets that are too big for your bed. They should not hang down onto the floor.  Have a firm chair that has side arms. You can use this for support while you get dressed.  Do not have throw rugs and other things on the floor that can make you trip. What can I do in the kitchen?  Clean up any spills right away.  Avoid walking on wet floors.  Keep items that you use a lot in easy-to-reach places.  If you need to reach something above you, use a strong step stool that has a grab bar.  Keep electrical cords out of the way.  Do not use floor polish or wax that makes floors slippery. If you must use wax, use non-skid floor wax.  Do not have throw rugs and other things on the floor that can make you trip. What can I do with my stairs?  Do not leave any items on the stairs.  Make sure that there are handrails on both sides of the stairs and use them. Fix handrails that are broken or loose. Make sure that handrails are as long as the stairways.  Check any carpeting to make sure that it is firmly attached to the stairs. Fix any carpet that is loose or worn.  Avoid having throw rugs at the top or bottom of the stairs. If you do have throw rugs, attach them to the floor with carpet tape.  Make sure that you have a light switch at the top of the stairs and the bottom of the stairs. If you do not have them, ask someone to add them for you. What else can I do to help prevent falls?  Wear shoes that:  Do not have high heels.  Have rubber bottoms.  Are comfortable and fit you well.  Are closed at the toe. Do not wear sandals.  If you use a stepladder:  Make sure that it  is fully opened. Do not climb a closed stepladder.  Make sure that both sides of the stepladder are locked into place.  Ask someone to hold it for you, if possible.  Clearly mark and make sure that you can see:  Any grab bars or handrails.  First and last steps.  Where the edge of each step is.  Use tools that help you move around (mobility aids) if they are needed. These include:  Canes.  Walkers.  Scooters.  Crutches.  Turn on the lights when you go into a dark area. Replace any light bulbs as soon as they burn out.  Set up your furniture so you have a clear path. Avoid moving your furniture around.  If any of your floors are uneven, fix them.  If there are any pets around you, be aware of where they are.  Review your medicines with your doctor. Some medicines can make you feel dizzy. This can increase your chance of falling. Ask your doctor what other things that you can do to help prevent falls. This information is not intended to replace advice given to you by your health care provider. Make sure you discuss any questions you have with your health care provider. Document Released: 08/06/2009 Document Revised: 03/17/2016 Document Reviewed: 11/14/2014 Elsevier Interactive Patient Education  2017 [employer].

## 2019-04-02 NOTE — Progress Notes (Signed)
Subjective:   Thomas Morgan is a 68 y.o. male who presents for Medicare Annual/Subsequent preventive examination.    This visit is being conducted through telemedicine due to the COVID-19 pandemic. This patient has given me verbal consent via doximity to conduct this visit, patient states they are participating from their home address. Some vital signs may be absent or patient reported.    Patient identification: identified by name, DOB, and current address  Review of Systems:  N/A  Cardiac Risk Factors include: advanced age (>54men, >55 women);diabetes mellitus;dyslipidemia;hypertension;male gender;smoking/ tobacco exposure     Objective:    Vitals: There were no vitals taken for this visit.  There is no height or weight on file to calculate BMI. Unable to obtain vitals due to visit being conducted via telephonically.   Advanced Directives 04/02/2019 04/02/2019 12/22/2017 03/16/2017  Does Patient Have a Medical Advance Directive? Yes Yes Yes Yes  Type of Paramedic of [place];Living will [place];Living will [place];Living will [place];Living will  Does patient want to make changes to medical advance directive? - - - No - Patient declined  Copy of [place] in Chart? No - copy requested No - copy requested No - copy requested -    Tobacco Social History   Tobacco Use  Smoking Status Current Every Day Smoker  . Packs/day: 1.00  . Years: 48.00  . Pack years: 48.00  . Types: Cigarettes  Smokeless Tobacco Never Used     Ready to quit: Not Answered Counseling given: Not Answered   Clinical Intake:  Pre-visit preparation completed: Yes  Pain : No/denies pain Pain Score: 0-No pain     Nutritional Status: BMI 25 -29 Overweight Diabetes: Yes  How often do you need to have someone help you when you read instructions, pamphlets, or other written materials from your doctor  or pharmacy?: 1 - Never   Diabetes:  Is the patient diabetic?  Yes prediabetic If diabetic, was a CBG obtained today?  No  Did the patient bring in their glucometer from home?  No  How often do you monitor your CBG's? Occasionally.   Financial Strains and Diabetes Management:  Are you having any financial strains with the device, your supplies or your medication? No .  Does the patient want to be seen by Chronic Care Management for management of their diabetes?  No  Would the patient like to be referred to a Nutritionist or for Diabetic Management?  No   Diabetic Exams:  Diabetic Eye Exam: Completed 07/23/18. Complete yearly.   Diabetic Foot Exam: Completed 10/07/17. Pt has been advised about the importance in completing this exam.  Note made to f/u on this at next in office visit.   Interpreter Needed?: No  Information entered by :: [facility] LPN  Past Medical History:  Diagnosis Date  . Cancer ([place])    basal cell  . Complication of anesthesia   . GERD (gastroesophageal reflux disease)   . High cholesterol   . Hypertension   . Personal history of tobacco use, presenting hazards to health 10/22/2015  . PONV (postoperative nausea and vomiting)    gets very sick from anesthesia  . Pre-diabetes    Past Surgical History:  Procedure Laterality Date  . ELBOW SURGERY    . 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  SKIN CANCER EXCISION    . WRIST SURGERY Right    Trauma resulting in prosthetics in the right wrist and elbow   Family History  Problem Relation Age of Onset  . Cancer Mother        Lung  . Hypertension Mother   . Heart Problems Father   . AAA (abdominal aortic aneurysm) Father   . Heart attack Brother   . Hypercholesterolemia Brother    Social History   Socioeconomic History  . Marital status: Married    Spouse name: Not on  file  . Number of children: 2  . Years of education: Not on file  . Highest education level: Bachelor's degree (e.g., BA, AB, BS)  Occupational History  . Not on file  Social Needs  . Financial resource strain: Not hard at all  . Food insecurity:    Worry: Never true    Inability: Never true  . Transportation needs:    Medical: No    Non-medical: No  Tobacco Use  . Smoking status: Current Every Day Smoker    Packs/day: 1.00    Years: 48.00    Pack years: 48.00    Types: Cigarettes  . Smokeless tobacco: Never Used  Substance and Sexual Activity  . Alcohol use: No    Alcohol/week: 0.0 standard drinks  . Drug use: No  . Sexual activity: Not on file  Lifestyle  . Physical activity:    Days per week: 0 days    Minutes per session: 0 min  . Stress: Not at all  Relationships  . Social connections:    Talks on phone: Patient refused    Gets together: Patient refused    Attends religious service: Patient refused    Active member of club or organization: Patient refused    Attends meetings of clubs or organizations: Patient refused    Relationship status: Patient refused  Other Topics Concern  . Not on file  Social History Narrative  . Not on file    Outpatient Encounter Medications as of 04/02/2019  Medication Sig  . aspirin (ASPIR-LOW) 81 MG EC tablet Take 81 mg by mouth daily.   Marland Kitchen buPROPion (WELLBUTRIN SR) 150 MG 12 hr tablet Take 1 tablet (150 mg total) by mouth 2 (two) times daily. (Patient not taking: Reported on 04/02/2019)  . ezetimibe (ZETIA) 10 MG tablet TAKE 1 TABLET BY MOUTH EVERY DAY  . glucose blood (ONETOUCH VERIO) test strip TEST ONCE DAILY  . hydrochlorothiazide (HYDRODIURIL) 50 MG tablet TAKE 1 TABLET BY MOUTH EVERY DAY  . lovastatin (MEVACOR) 40 MG tablet Take 1 tablet (40 mg total) by mouth daily.  Marland Kitchen omeprazole (PRILOSEC) 20 MG capsule TAKE 2 CAPSULES BY MOUTH EVERY DAY (Patient taking differently: take 40mg  daily)  . vitamin C (ASCORBIC ACID) 500 MG tablet  Take 500 mg by mouth 2 (two) times daily.    No facility-administered encounter medications on file as of 04/02/2019.     Activities of Daily Living In your present state of health, do you have any difficulty performing the following activities: 04/02/2019 04/02/2019  Hearing? N N  Vision? N N  Difficulty concentrating or making decisions? N N  Walking or climbing stairs? N N  Dressing or bathing? N N  Doing errands, shopping? N N  Preparing Food and eating ? N N  Using the Toilet? N N  In the past six months, have you accidently leaked urine? N N  Do you have problems with loss of bowel  control? N N  Managing your Medications? N N  Managing your Finances? N N  Housekeeping or managing your Housekeeping? N N  Some recent data might be hidden    Patient Care Team: Birdie Sons, MD as PCP - General (Family Medicine) Marlowe Sax, MD as Referring Physician (Rheumatology) Sharlotte Alamo, DPM (Podiatry)   Assessment:   This is a routine wellness examination for Lanis.  Exercise Activities and Dietary recommendations Current Exercise Habits: The patient does not participate in regular exercise at present, Exercise limited by: orthopedic condition(s)  Goals    . Exercise 150 min/wk Moderate Activity     Recommend to continue exercising 3 days a week for 1 hour.        Fall Risk Fall Risk  04/02/2019 04/02/2019 10/30/2018 12/22/2017 12/12/2016  Falls in the past year? 1 0 0 No No  Number falls in past yr: 0 - - - -  Injury with Fall? 0 - - - -   FALL RISK PREVENTION PERTAINING TO THE HOME:  Any stairs in or around the home? Yes  If so, are there any without handrails? No   Home free of loose throw rugs in walkways, pet beds, electrical cords, etc? Yes  Adequate lighting in your home to reduce risk of falls? Yes   ASSISTIVE DEVICES UTILIZED TO PREVENT FALLS:  Life alert? No  Use of a cane, walker or w/c? No  Grab bars in the bathroom? No  Shower chair or bench in  shower? No  Elevated toilet seat or a handicapped toilet? No    TIMED UP AND GO:  Was the test performed? No .    Depression Screen PHQ 2/9 Scores 04/02/2019 04/02/2019 10/30/2018 12/22/2017  PHQ - 2 Score 0 0 0 0  PHQ- 9 Score - - - 0    Cognitive Function     6CIT Screen 04/02/2019 04/02/2019  What Year? 0 points 0 points  What month? 0 points 0 points  What time? 0 points 0 points  Count back from 20 0 points 0 points  Months in reverse 0 points 0 points  Repeat phrase 2 points 2 points  Total Score 2 2    Immunization History  Administered Date(s) Administered  . Influenza, High Dose Seasonal PF 10/08/2015, 07/05/2016, 07/27/2018  . Influenza-Unspecified 12/01/2017  . Pneumococcal Conjugate-13 10/08/2015  . Pneumococcal Polysaccharide-23 09/10/2008, 12/12/2016  . Td 10/24/1997  . Tdap 06/06/2007    Qualifies for Shingles Vaccine? Yes, due for Shingrix. Education has been provided regarding the importance of this vaccine. Pt has been advised to call insurance company to determine out of pocket expense. Advised may also receive vaccine at local pharmacy or Health Dept. Verbalized acceptance and understanding.  Tdap: Although this vaccine is not a covered service during a Wellness Exam, does the patient still wish to receive this vaccine today?  No . Advised may receive this vaccine at local pharmacy or Health Dept. Aware to provide a copy of the vaccination record if obtained from local pharmacy or Health Dept. Verbalized acceptance and understanding.  Flu Vaccine: Up to date  Pneumococcal Vaccine: Completed series   Screening Tests Health Maintenance  Topic Date Due  . FOOT EXAM  10/07/2016  . TETANUS/TDAP  06/05/2017  . URINE MICROALBUMIN  12/23/2018  . HEMOGLOBIN A1C  01/30/2019  . INFLUENZA VACCINE  05/25/2019  . OPHTHALMOLOGY EXAM  07/24/2019  . COLONOSCOPY  12/20/2019  . Hepatitis C Screening  Completed  . PNA vac Low Risk  Adult  Completed   Cancer Screenings:   Colorectal Screening: Completed 12/19/14. Repeat every 5 years.  Lung Cancer Screening: (Low Dose CT Chest recommended if Age 70-80 years, 30 pack-year currently smoking OR have quit w/in 15years.) does qualify however had this completed 11/12/18.  Additional Screening:  Hepatitis C Screening: Up to date  Dental Screening: Recommended annual dental exams for proper oral hygiene  Community Resource Referral:  CRR required this visit?  No        Plan:  I have personally reviewed and addressed the Medicare Annual Wellness questionnaire and have noted the following in the patient's chart:  A. Medical and social history B. Use of alcohol, tobacco or illicit drugs  C. Current medications and supplements D. Functional ability and status E.  Nutritional status F.  Physical activity G. Advance directives H. List of other physicians I.  Hospitalizations, surgeries, and ER visits in previous 12 months J.  [place] such as hearing and vision if needed, cognitive and depression L. Referrals and appointments   In addition, I have reviewed and discussed with patient certain preventive protocols, quality metrics, and best practice recommendations. A written personalized care plan for preventive services as well as general preventive health recommendations were provided to patient.   Glendora Score, [place]  04/25/7105 Nurse Health Advisor  Nurse Notes: Pt needs a diabetic foot exam, urine check and Hgb A1c checked at next in office visit.

## 2019-04-08 ENCOUNTER — Other Ambulatory Visit: Payer: Self-pay

## 2019-04-08 ENCOUNTER — Encounter: Payer: Self-pay | Admitting: Family Medicine

## 2019-04-08 ENCOUNTER — Ambulatory Visit (INDEPENDENT_AMBULATORY_CARE_PROVIDER_SITE_OTHER): Payer: Medicare Other | Admitting: Family Medicine

## 2019-04-08 VITALS — BP 126/72 | HR 64 | Temp 98.7°F | Resp 16 | Ht 71.0 in | Wt 207.0 lb

## 2019-04-08 DIAGNOSIS — Z0000 Encounter for general adult medical examination without abnormal findings: Secondary | ICD-10-CM | POA: Diagnosis not present

## 2019-04-08 DIAGNOSIS — E1159 Type 2 diabetes mellitus with other circulatory complications: Secondary | ICD-10-CM | POA: Diagnosis not present

## 2019-04-08 DIAGNOSIS — R202 Paresthesia of skin: Secondary | ICD-10-CM

## 2019-04-08 DIAGNOSIS — I700 Atherosclerosis of aorta: Secondary | ICD-10-CM

## 2019-04-08 DIAGNOSIS — Z125 Encounter for screening for malignant neoplasm of prostate: Secondary | ICD-10-CM

## 2019-04-08 DIAGNOSIS — I10 Essential (primary) hypertension: Secondary | ICD-10-CM

## 2019-04-08 DIAGNOSIS — R200 Anesthesia of skin: Secondary | ICD-10-CM

## 2019-04-08 NOTE — Patient Instructions (Addendum)
.   Please review the attached list of medications and notify my office if there are any errors.   . Please bring all of your medications to every appointment so we can make sure that our medication list is the same as yours.    The CDC recommends two doses of Shingrix (the shingles vaccine) separated by 2 to 6 months for adults age 69 years and older. I recommend checking with your insurance plan regarding coverage for this vaccine.   . You are due for a Td (tetanus-diptheria-vaccine) which protects you from tetanus and diptheria. Please check with your insurance plan or pharmacy regarding coverage for this vaccine.

## 2019-04-08 NOTE — Progress Notes (Signed)
Patient: Thomas Morgan, Male    DOB: [birth year], 68 y.o.   MRN: [id number] Visit Date: 04/08/2019  Today's Provider: Lelon Huh, MD   Chief Complaint  Patient presents with   Annual Exam   Diabetes   Hyperlipidemia   Hypertension   Subjective:     Complete Physical Thomas Morgan is a 68 y.o. male. He feels fairly well. He reports no regular exercising. He reports he is sleeping fairly well.  -----------------------------------------------------------   Diabetes Mellitus Type II, Follow-up:   Lab Results  Component Value Date   HGBA1C 6.7 (H) 07/31/2018   HGBA1C 6.8 (A) 03/28/2018   HGBA1C 6.8 (H) 12/25/2017   Last seen for diabetes 8 months ago.  Management since then includes no changes. He reports good compliance with treatment. He is not having side effects.  Current symptoms include none and have been stable. Home blood sugar records: fasting range: 120's  Episodes of hypoglycemia? no   Current Insulin Regimen: none Most Recent Eye Exam: 07/23/2018 Weight trend: decreasing steadily Prior visit with dietician: no Current diet: well balanced Current exercise: none  ------------------------------------------------------------------------   Hypertension, follow-up:  BP Readings from Last 3 Encounters:  04/08/19 126/72  10/30/18 140/90  07/27/18 118/64    He was last seen for hypertension 8 months ago.  BP at that visit was 118/64 . Management since that visit includes no changes.He reports good compliance with treatment. He is not having side effects.  He is not exercising. He is adherent to low salt diet.   Outside blood pressures are not being checked. He is experiencing none.  Patient denies chest pain, chest pressure/discomfort, claudication, dyspnea, exertional chest pressure/discomfort, fatigue, irregular heart beat, lower extremity edema, near-syncope, orthopnea, palpitations, paroxysmal nocturnal dyspnea, syncope and tachypnea.     Cardiovascular risk factors include advanced age (older than 7 for men, 62 for women), diabetes mellitus, dyslipidemia, hypertension and male gender.  Use of agents associated with hypertension: NSAIDS.   ------------------------------------------------------------------------    Lipid/Cholesterol, Follow-up:   Last seen for this 8 months ago.  Management since that visit includes no changes.  Last Lipid Panel:    Component Value Date/Time   CHOL 158 07/31/2018 0843   TRIG 180 (H) 07/31/2018 0843   HDL 28 (L) 07/31/2018 0843   CHOLHDL 5.6 (H) 07/31/2018 0843   LDLCALC 94 07/31/2018 0843    He reports good compliance with treatment. He is not having side effects.   Wt Readings from Last 3 Encounters:  04/08/19 207 lb (93.9 kg)  11/12/18 209 lb (94.8 kg)  10/30/18 218 lb 6.4 oz (99.1 kg)    ------------------------------------------------------------------------  Review of Systems  Constitutional: Negative for appetite change, chills, fatigue and fever.  HENT: Negative for congestion, ear pain, hearing loss, nosebleeds and trouble swallowing.   Eyes: Negative for pain and visual disturbance.  Respiratory: Negative for cough, chest tightness and shortness of breath.   Cardiovascular: Negative for chest pain, palpitations and leg swelling.  Gastrointestinal: Negative for abdominal pain, blood in stool, constipation, diarrhea, nausea and vomiting.  Endocrine: Negative for polydipsia, polyphagia and polyuria.  Genitourinary: Negative for dysuria and flank pain.  Musculoskeletal: Positive for back pain. Negative for arthralgias, joint swelling, myalgias and neck stiffness.  Skin: Negative for color change, rash and wound.  Neurological: Negative for dizziness, tremors, seizures, speech difficulty, weakness, light-headedness and headaches.  Psychiatric/Behavioral: Negative for behavioral problems, confusion, decreased concentration, dysphoric mood and sleep disturbance. The  patient is not  nervous/anxious.   All other systems reviewed and are negative.   Social History   Socioeconomic History   Marital status: Married    Spouse name: Not on file   Number of children: 2   Years of education: Not on file   Highest education level: Bachelor's degree (e.g., BA, AB, BS)  Occupational History   Not on file  Social Needs   Financial resource strain: Not hard at all   Food insecurity    Worry: Never true    Inability: Never true   Transportation needs    Medical: No    Non-medical: No  Tobacco Use   Smoking status: Current Every Day Smoker    Packs/day: 1.00    Years: 48.00    Pack years: 48.00    Types: Cigarettes   Smokeless tobacco: Never Used  Substance and Sexual Activity   Alcohol use: No    Alcohol/week: 0.0 standard drinks   Drug use: No   Sexual activity: Not on file  Lifestyle   Physical activity    Days per week: 0 days    Minutes per session: 0 min   Stress: Not at all  Relationships   Social connections    Talks on phone: Patient refused    Gets together: Patient refused    Attends religious service: Patient refused    Active member of club or organization: Patient refused    Attends meetings of clubs or organizations: Patient refused    Relationship status: Patient refused   Intimate partner violence    Fear of current or ex partner: Patient refused    Emotionally abused: Patient refused    Physically abused: Patient refused    Forced sexual activity: Patient refused  Other Topics Concern   Not on file  Social History Narrative   Not on file    Past Medical History:  Diagnosis Date   Cancer ([place])    basal cell   Complication of anesthesia    GERD (gastroesophageal reflux disease)    High cholesterol    Hypertension    Personal history of tobacco use, presenting hazards to health 10/22/2015   PONV (postoperative nausea and vomiting)    gets very sick from anesthesia   Pre-diabetes       Patient Active Problem List   Diagnosis Date Noted   Tenosynovitis of hand 10/10/2017   Aortic aneurysm ([place]) 03/17/2017   Abdominal bruit 03/10/2017   Coronary arteriosclerosis 12/12/2016   Atherosclerosis of aorta ([place]) 11/14/2016   Numbness and tingling of foot 10/08/2015   Allergic rhinitis 04/24/2015   Back pain, chronic 04/24/2015   Diabetes ([place]) 04/24/2015   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   GERD (gastroesophageal reflux disease) 04/24/2015   Hypertension 04/24/2015   Obesity 04/24/2015   Disorder of male genital organs 04/24/2015   Tubular adenoma 04/24/2015   Vasomotor rhinitis 04/24/2015   Chronic cough 10/03/2011   Ventral hernia 05/25/2010   Hyperlipidemia 10/12/2009   Arthralgia of multiple joints 10/12/2009   Hypersomnia 08/22/2008   Psoriasis 08/22/2008   Hypersteatosis 06/06/2007   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   Tobacco abuse 10/24/1968    Past Surgical History:  Procedure Laterality Date   BASAL CELL CARCINOMA EXCISION Left 2020   left arm   CYST REMOVAL TRUNK  2020   on his back   Post Left 03/24/2017   Procedure: EXCISION MORTON'S NEUROMA;  Surgeon: Sharlotte Alamo, DPM;  Location: [facility] ORS;  Service: Podiatry;  Laterality: Left;   SKIN CANCER EXCISION     WRIST SURGERY Right    Trauma resulting in prosthetics in the right wrist and elbow    His family history includes AAA (abdominal aortic aneurysm) in his father; Cancer in his mother; Heart Problems in his father; Heart attack in his brother; Hypercholesterolemia in his brother; Hypertension in his mother.   Current Outpatient Medications:    aspirin (ASPIR-LOW) 81 MG EC tablet, Take 81 mg by mouth daily. , Disp: , Rfl:    ezetimibe (ZETIA) 10 MG tablet, TAKE 1 TABLET BY MOUTH EVERY DAY, Disp: 90 tablet, Rfl: 4   glucose blood (ONETOUCH VERIO) test strip, TEST ONCE DAILY, Disp: 100 each, Rfl: 4    hydrochlorothiazide (HYDRODIURIL) 50 MG tablet, TAKE 1 TABLET BY MOUTH EVERY DAY, Disp: 90 tablet, Rfl: 4   lovastatin (MEVACOR) 40 MG tablet, Take 1 tablet (40 mg total) by mouth daily., Disp: 90 tablet, Rfl: 3   omeprazole (PRILOSEC) 20 MG capsule, TAKE 2 CAPSULES BY MOUTH EVERY DAY (Patient taking differently: take 40mg  daily), Disp: 180 capsule, Rfl: 4   vitamin C (ASCORBIC ACID) 500 MG tablet, Take 500 mg by mouth 2 (two) times daily. , Disp: , Rfl:    buPROPion (WELLBUTRIN SR) 150 MG 12 hr tablet, Take 1 tablet (150 mg total) by mouth 2 (two) times daily. (Patient not taking: Reported on 04/02/2019), Disp: 180 tablet, Rfl: 1  Patient Care Team: Birdie Sons, MD as PCP - General (Family Medicine) Marlowe Sax, MD as Referring Physician (Rheumatology) Sharlotte Alamo, DPM (Podiatry)     Objective:    Vitals: BP 126/72 (BP Location: Left Arm, Patient Position: Sitting, Cuff Size: Large)    Pulse 64    Temp 98.7 F (37.1 C) (Oral)    Resp 16    Ht 5\' 11"  (1.803 m)    Wt 207 lb (93.9 kg)    SpO2 96% Comment: room air   BMI 28.87 kg/m   Physical Exam  General Appearance:    Alert, cooperative, no distress, appears stated age  Head:    Normocephalic, without obvious abnormality, atraumatic  Eyes:    PERRL, conjunctiva/corneas clear, EOM's intact, fundi    benign, both eyes       Ears:    Normal TM's and external ear canals, both ears  Nose:   Nares normal, septum midline, mucosa normal, no drainage   or sinus tenderness  Throat:   Lips, mucosa, and tongue normal; teeth and gums normal  Neck:   Supple, symmetrical, trachea midline, no adenopathy;       thyroid:  No enlargement/tenderness/nodules; no carotid   bruit or JVD  Back:     Symmetric, no curvature, ROM normal, no CVA tenderness  Lungs:     Clear to auscultation bilaterally, respirations unlabored  Chest wall:    No tenderness or deformity  Heart:    Regular rate and rhythm, S1 and S2 normal, no murmur, rub   or  gallop  Abdomen:     Soft, non-tender, bowel sounds active all four quadrants,    no masses, no organomegaly  Genitalia:    deferred  Rectal:    deferred  Extremities:   Extremities normal, atraumatic, no cyanosis or edema  Pulses:   2+ and symmetric all extremities  Skin:   Skin color, texture, turgor normal, no rashes or lesions  Lymph nodes:   Cervical, supraclavicular, and axillary nodes normal  Neurologic:   CNII-XII  intact. Normal strength, sensation and reflexes      throughout    Activities of Daily Living In your present state of health, do you have any difficulty performing the following activities: 04/02/2019 04/02/2019  Hearing? N N  Vision? N N  Difficulty concentrating or making decisions? N N  Walking or climbing stairs? N N  Dressing or bathing? N N  Doing errands, shopping? N N  Preparing Food and eating ? N N  Using the Toilet? N N  In the past six months, have you accidently leaked urine? N N  Do you have problems with loss of bowel control? N N  Managing your Medications? N N  Managing your Finances? N N  Housekeeping or managing your Housekeeping? N N  Some recent data might be hidden    Fall Risk Assessment Fall Risk  04/02/2019 04/02/2019 10/30/2018 12/22/2017 12/12/2016  Falls in the past year? 1 0 0 No No  Number falls in past yr: 0 - - - -  Injury with Fall? 0 - - - -     Depression Screen PHQ 2/9 Scores 04/02/2019 04/02/2019 10/30/2018 12/22/2017  PHQ - 2 Score 0 0 0 0  PHQ- 9 Score - - - 0    6CIT Screen 04/02/2019  What Year? 0 points  What month? 0 points  What time? 0 points  Count back from 20 0 points  Months in reverse 0 points  Repeat phrase 2 points  Total Score 2       Assessment & Plan:    Annual Physical Reviewed patient's Family Medical History Reviewed and updated list of patient's medical providers Assessment of cognitive impairment was done Assessed patient's functional ability Established a written schedule for health screening  [place] Completed and Reviewed  Exercise Activities and Dietary recommendations Goals     Exercise 150 min/wk Moderate Activity     Recommend to continue exercising 3 days a week for 1 hour.        Immunization History  Administered Date(s) Administered   Influenza, High Dose Seasonal PF 10/08/2015, 07/05/2016, 07/27/2018   Influenza-Unspecified 12/01/2017   Pneumococcal Conjugate-13 10/08/2015   Pneumococcal Polysaccharide-23 09/10/2008, 12/12/2016   Td 10/24/1997   Tdap 06/06/2007    Health Maintenance  Topic Date Due   FOOT EXAM  10/07/2016   TETANUS/TDAP  06/05/2017   URINE MICROALBUMIN  12/23/2018   HEMOGLOBIN A1C  01/30/2019   INFLUENZA VACCINE  05/25/2019   OPHTHALMOLOGY EXAM  07/24/2019   COLONOSCOPY  12/20/2019   Hepatitis C Screening  Completed   PNA vac Low Risk Adult  Completed     Discussed health benefits of physical activity, and encouraged him to engage in regular exercise appropriate for his age and condition.    ------------------------------------------------------------------------------------------------------------  1. Prostate cancer screening  - Comprehensive metabolic panel - Lipid panel - PSA  2. Type 2 diabetes mellitus with other circulatory complication, without long-term current use of insulin (HCC)  - Comprehensive metabolic panel - Lipid panel - Hemoglobin A1c  3. Annual physical exam   4. Atherosclerosis of aorta ([place])   5. Numbness and tingling of foot Discusses trial of gabapentin, however she states if he wears socks to bed it doesn't keep him up.   6. Essential hypertension Well controlled.  Continue current medications.     Lelon Huh, MD  [facility]

## 2019-04-10 ENCOUNTER — Telehealth: Payer: Self-pay

## 2019-04-10 LAB — COMPREHENSIVE METABOLIC PANEL
ALT: 17 IU/L (ref 0–44)
AST: 13 IU/L (ref 0–40)
Albumin/Globulin Ratio: 2.2 (ref 1.2–2.2)
Albumin: 4.3 g/dL (ref 3.8–4.8)
Alkaline Phosphatase: 81 IU/L (ref 39–117)
BUN/Creatinine Ratio: 14 (ref 10–24)
BUN: 13 mg/dL (ref 8–27)
Bilirubin Total: 0.5 mg/dL (ref 0.0–1.2)
CO2: 27 mmol/L (ref 20–29)
Calcium: 9.2 mg/dL (ref 8.6–10.2)
Chloride: 101 mmol/L (ref 96–106)
Creatinine, Ser: 0.9 mg/dL (ref 0.76–1.27)
GFR calc Af Amer: 101 mL/min/{1.73_m2} (ref >59)
GFR calc non Af Amer: 87 mL/min/{1.73_m2} (ref >59)
Globulin, Total: 2 g/dL (ref 1.5–4.5)
Glucose: 131 mg/dL — ABNORMAL HIGH (ref 65–99)
Potassium: 3.7 mmol/L (ref 3.5–5.2)
Sodium: 143 mmol/L (ref 134–144)
Total Protein: 6.3 g/dL (ref 6.0–8.5)

## 2019-04-10 LAB — PSA: Prostate Specific Ag, Serum: 0.3 ng/mL (ref 0.0–4.0)

## 2019-04-10 LAB — LIPID PANEL
Chol/HDL Ratio: 5.3 ratio — ABNORMAL HIGH (ref 0.0–5.0)
Cholesterol, Total: 147 mg/dL (ref 100–199)
HDL: 28 mg/dL — ABNORMAL LOW (ref >39)
LDL Calculated: 85 mg/dL (ref 0–99)
Triglycerides: 168 mg/dL — ABNORMAL HIGH (ref 0–149)
VLDL Cholesterol Cal: 34 mg/dL (ref 5–40)

## 2019-04-10 LAB — HEMOGLOBIN A1C
Est. average glucose Bld gHb Est-mCnc: 151 mg/dL
Hgb A1c MFr Bld: 6.9 % — ABNORMAL HIGH (ref 4.8–5.6)

## 2019-04-10 NOTE — Telephone Encounter (Signed)
LMTCB 04/10/2019   Thanks,   -Laura  

## 2019-04-10 NOTE — Telephone Encounter (Signed)
-----   Message from Birdie Sons, MD sent at 04/10/2019  7:26 AM EDT ----- a1c is stable at 6.9. cholesterol is better at 147. psa is normal. Continue current medications.  Follow up for diabetes 6 months.

## 2019-04-12 NOTE — Telephone Encounter (Signed)
Advised patient of results.  

## 2019-04-26 ENCOUNTER — Other Ambulatory Visit: Payer: Self-pay | Admitting: Family Medicine

## 2019-04-26 DIAGNOSIS — E785 Hyperlipidemia, unspecified: Secondary | ICD-10-CM

## 2019-05-13 ENCOUNTER — Telehealth: Payer: Self-pay | Admitting: Family Medicine

## 2019-05-13 DIAGNOSIS — R200 Anesthesia of skin: Secondary | ICD-10-CM

## 2019-05-13 DIAGNOSIS — R202 Paresthesia of skin: Secondary | ICD-10-CM

## 2019-05-13 NOTE — Telephone Encounter (Signed)
Pt is calling because he is having some numbness in his hands and feet.  He wants to know what kind specialist he should see.  She says he has talked to Dr. Caryn Section before regarding this  CB#  [phone]  [place]

## 2019-05-13 NOTE — Telephone Encounter (Signed)
Would recommend referral to neurology. Can enter order if he is agreeable

## 2019-05-13 NOTE — Telephone Encounter (Signed)
Patient advised and agrees to referral. Please schedule. Patient states he will be out of town on 05/24/2019 until 06/03/2019.

## 2019-06-24 ENCOUNTER — Telehealth: Payer: Self-pay

## 2019-06-24 MED ORDER — ONETOUCH VERIO VI STRP: ORAL_STRIP | 3 refills | Status: AC

## 2019-06-24 MED ORDER — ONETOUCH DELICA LANCETS 33G MISC: 3 refills | Status: AC

## 2019-06-24 NOTE — Telephone Encounter (Signed)
Patient called requesting a refill on test strips and lancets.

## 2019-08-13 ENCOUNTER — Ambulatory Visit: Payer: Medicare Other | Admitting: Family Medicine

## 2019-08-13 ENCOUNTER — Other Ambulatory Visit: Payer: Self-pay

## 2019-08-13 ENCOUNTER — Encounter: Payer: Self-pay | Admitting: Family Medicine

## 2019-08-13 VITALS — BP 112/75 | HR 90 | Temp 97.3°F | Resp 16 | Wt 212.8 lb

## 2019-08-13 DIAGNOSIS — Z23 Encounter for immunization: Secondary | ICD-10-CM | POA: Diagnosis not present

## 2019-08-13 DIAGNOSIS — M5431 Sciatica, right side: Secondary | ICD-10-CM

## 2019-08-13 DIAGNOSIS — M5432 Sciatica, left side: Secondary | ICD-10-CM | POA: Diagnosis not present

## 2019-08-13 MED ORDER — GABAPENTIN 300 MG PO CAPS: 300.0000 mg | ORAL_CAPSULE | Freq: Every day | ORAL | 1 refills | Status: DC

## 2019-08-13 MED ORDER — NAPROXEN 500 MG PO TABS: 500.0000 mg | ORAL_TABLET | Freq: Two times a day (BID) | ORAL | 0 refills | Status: DC

## 2019-08-13 NOTE — Progress Notes (Signed)
Patient: Thomas Morgan Male    DOB: 08/15/1951   68 y.o.   MRN: [id number] Visit Date: 08/13/2019  Today's Provider: Lelon Huh, MD   Chief Complaint  Patient presents with  . Back Pain   Subjective:     Back Pain This is a new problem. The current episode started more than 1 month ago. The problem occurs constantly. The pain is present in the lumbar spine. The quality of the pain is described as shooting. The pain radiates to the left thigh and right thigh. Worse during: in the mornings. Stiffness is present in the morning. Associated symptoms include leg pain. He has tried chiropractic manipulation for the symptoms. The treatment provided mild relief.   Worse when h gets up in the morning. Symmetric. Taken OTC ibuprofen at night which helps sleep. Legs sometimes feel heavy. No pain on sides or fronts of legs.   Allergies  Allergen Reactions  . Atorvastatin     Aching and feeling down  . Crestor [Rosuvastatin] Other (See Comments)    Muscle aches  . Hydrocodone Other (See Comments)    Palpitations, increased heart rate  . Lescol  [Fluvastatin]     Muscle Aches     Current Outpatient Medications:  .  ezetimibe (ZETIA) 10 MG tablet, TAKE 1 TABLET BY MOUTH EVERY DAY, Disp: 90 tablet, Rfl: 4 .  glucose blood (ONETOUCH VERIO) test strip, TEST ONCE DAILY  Dx: E11.9, Disp: 100 each, Rfl: 3 .  hydrochlorothiazide (HYDRODIURIL) 50 MG tablet, TAKE 1 TABLET BY MOUTH EVERY DAY, Disp: 90 tablet, Rfl: 4 .  lovastatin (MEVACOR) 40 MG tablet, TAKE 1 TABLET BY MOUTH EVERY DAY, Disp: 90 tablet, Rfl: 3 .  OneTouch Delica Lancets 99991111 MISC, Use to check blood sugar once a day.  DX: E11.9, Disp: 100 each, Rfl: 3 .  vitamin C (ASCORBIC ACID) 500 MG tablet, Take 500 mg by mouth 2 (two) times daily. , Disp: , Rfl:  .  aspirin (ASPIR-LOW) 81 MG EC tablet, Take 81 mg by mouth daily. , Disp: , Rfl:  .  buPROPion (WELLBUTRIN SR) 150 MG 12 hr tablet, Take 1 tablet (150 mg total) by mouth 2  (two) times daily. (Patient not taking: Reported on 04/02/2019), Disp: 180 tablet, Rfl: 1 .  omeprazole (PRILOSEC) 40 MG capsule, TK 1 C PO QD, Disp: , Rfl:   Review of Systems  Constitutional: Negative.   Respiratory: Negative.   Cardiovascular: Negative.   Musculoskeletal: Positive for back pain.    Social History   Tobacco Use  . Smoking status: Current Every Day Smoker    Packs/day: 1.00    Years: 48.00    Pack years: 48.00    Types: Cigarettes  . Smokeless tobacco: Never Used  Substance Use Topics  . Alcohol use: No    Alcohol/week: 0.0 standard drinks      Objective:   BP 112/75 (BP Location: Right Arm, Patient Position: Sitting, Cuff Size: Large)   Pulse 90   Temp (!) 97.3 F (36.3 C) (Temporal)   Resp 16   Wt 212 lb 12.8 oz (96.5 kg)   SpO2 97%   BMI 29.68 kg/m  Vitals:   08/13/19 1342  BP: 112/75  Pulse: 90  Resp: 16  Temp: (!) 97.3 F (36.3 C)  TempSrc: Temporal  SpO2: 97%  Weight: 212 lb 12.8 oz (96.5 kg)  Body mass index is 29.68 kg/m.   Physical Exam   General Appearance:  Overweight male in no acute distress  Eyes:    PERRL, conjunctiva/corneas clear, EOM's intact       Lungs:     Clear to auscultation bilaterally, respirations unlabored  Heart:    Normal heart rate. Normal rhythm. No murmurs, rubs, or gallops.   MS:   All extremities are intact. Mild tenderness bilateral lower lumbar area, no masses. FROM of BLEs. Normal LE strength.   Neurologic:   Awake, alert, oriented x 3. No apparent focal neurological           defect.          Assessment & Plan    1. Bilateral sciatica Had xrays at [facility] ordered by Dr. Manuella Ghazi in August. Patient sent ROI to get copy of report.  - naproxen (NAPROSYN) 500 MG tablet; Take 1 tablet (500 mg total) by mouth 2 (two) times daily with a meal.  Dispense: 30 tablet; Refill: 0 - gabapentin (NEURONTIN) 300 MG capsule; Take 1 capsule (300 mg total) by mouth at bedtime. For sciatic nerve pain  Dispense: 30 capsule;  Refill: 1  2. Need for influenza vaccination  - Flu Vaccine QUAD High Dose(Fluad)  Call if symptoms change or if not rapidly improving.     The entirety of the information documented in the History of Present Illness, Review of Systems and Physical Exam were personally obtained by me. Portions of this information were initially documented by Idelle Jo, CMA and reviewed by me for thoroughness and accuracy.     Lelon Huh, MD  [facility]

## 2019-08-13 NOTE — Patient Instructions (Signed)
.   Please review the attached list of medications and notify my office if there are any errors.   . Please bring all of your medications to every appointment so we can make sure that our medication list is the same as yours.   . It is especially important to get the annual flu vaccine this year. If you haven't had it already, please go to your pharmacy or call the office as soon as possible to schedule you flu shot.  

## 2019-09-17 ENCOUNTER — Other Ambulatory Visit: Payer: Self-pay | Admitting: Family Medicine

## 2019-09-17 ENCOUNTER — Telehealth: Payer: Self-pay

## 2019-09-17 NOTE — Telephone Encounter (Signed)
Prescription for EZETIMIBE 10MG  TABLETS, expired on 08/31/2019. Please review for refill.

## 2019-09-26 ENCOUNTER — Telehealth: Payer: Self-pay | Admitting: Family Medicine

## 2019-09-26 DIAGNOSIS — M5432 Sciatica, left side: Secondary | ICD-10-CM

## 2019-09-26 DIAGNOSIS — G8929 Other chronic pain: Secondary | ICD-10-CM

## 2019-09-26 NOTE — Telephone Encounter (Signed)
Please advise if ok to order referral.

## 2019-09-26 NOTE — Telephone Encounter (Signed)
Opened in error

## 2019-09-26 NOTE — Telephone Encounter (Signed)
Pt is request a referral to [facility] for back pain. Pt would like to have referral set up with Dr. Vertell Limber    Fax: [phone] Phone: [phone]

## 2019-09-27 NOTE — Telephone Encounter (Signed)
done

## 2019-10-02 NOTE — Telephone Encounter (Signed)
Pt states Dr Vertell Limber office does NOT have referral sent 12/07.  Pt states they called him this morning and told him.  Can you resend? Fax: [phone]

## 2019-10-07 NOTE — Telephone Encounter (Signed)
Patient states he was given the wrong fax # and would like PCP nurse to re fax to [facility], new fax # [phone].

## 2019-10-07 NOTE — Telephone Encounter (Signed)
Thomas Morgan, will you review this referral. Patient states Dr. Melven Sartorius office didn't receive the referral. He is requesting that the referral be re faxed.

## 2019-10-07 NOTE — Telephone Encounter (Signed)
From [facility] 

## 2019-11-05 ENCOUNTER — Telehealth: Payer: Self-pay

## 2019-11-05 DIAGNOSIS — Z87891 Personal history of nicotine dependence: Secondary | ICD-10-CM

## 2019-11-05 NOTE — Telephone Encounter (Signed)
Patient has been notified that lung cancer screening CT scan is due currently or will be in near future. Confirmed that patient is within the appropriate age range, and asymptomatic, (no signs or symptoms of lung cancer). Patient denies illness that would prevent curative treatment for lung cancer if found. Verified smoking history (1ppd) Patient is agreeable for CT scan being scheduled any time/day except Monday. Patient states that he now has [employer] for insurance and was questioning how this would affect him getting billed for this.

## 2019-11-06 DIAGNOSIS — I10 Essential (primary) hypertension: Secondary | ICD-10-CM | POA: Diagnosis not present

## 2019-11-06 DIAGNOSIS — M4300 Spondylolysis, site unspecified: Secondary | ICD-10-CM | POA: Diagnosis not present

## 2019-11-06 DIAGNOSIS — M4316 Spondylolisthesis, lumbar region: Secondary | ICD-10-CM | POA: Diagnosis not present

## 2019-11-06 DIAGNOSIS — Z6830 Body mass index (BMI) 30.0-30.9, adult: Secondary | ICD-10-CM | POA: Diagnosis not present

## 2019-11-06 DIAGNOSIS — M5416 Radiculopathy, lumbar region: Secondary | ICD-10-CM | POA: Diagnosis not present

## 2019-11-06 DIAGNOSIS — M545 Low back pain: Secondary | ICD-10-CM | POA: Diagnosis not present

## 2019-11-06 NOTE — Telephone Encounter (Signed)
Smoking history: current, 49 pack year °

## 2019-11-06 NOTE — Addendum Note (Signed)
Addended by: Lieutenant Diego on: 11/06/2019 09:27 AM   Modules accepted: Orders

## 2019-11-08 ENCOUNTER — Ambulatory Visit
Admission: RE | Admit: 2019-11-08 | Discharge: 2019-11-08 | Disposition: A | Discharge status: Home / Self Care | Payer: Medicare PPO | Source: Ambulatory Visit | Attending: Oncology | Admitting: Oncology

## 2019-11-08 ENCOUNTER — Other Ambulatory Visit: Payer: Self-pay

## 2019-11-08 DIAGNOSIS — Z87891 Personal history of nicotine dependence: Secondary | ICD-10-CM | POA: Insufficient documentation

## 2019-11-08 DIAGNOSIS — F17210 Nicotine dependence, cigarettes, uncomplicated: Secondary | ICD-10-CM | POA: Diagnosis not present

## 2019-11-11 ENCOUNTER — Encounter: Payer: Self-pay | Admitting: *Deleted

## 2019-11-27 DIAGNOSIS — M5416 Radiculopathy, lumbar region: Secondary | ICD-10-CM | POA: Diagnosis not present

## 2019-11-27 DIAGNOSIS — M48061 Spinal stenosis, lumbar region without neurogenic claudication: Secondary | ICD-10-CM | POA: Insufficient documentation

## 2019-11-27 DIAGNOSIS — M4300 Spondylolysis, site unspecified: Secondary | ICD-10-CM | POA: Diagnosis not present

## 2019-11-27 DIAGNOSIS — M4316 Spondylolisthesis, lumbar region: Secondary | ICD-10-CM | POA: Diagnosis not present

## 2019-11-27 DIAGNOSIS — I7789 Other specified disorders of arteries and arterioles: Secondary | ICD-10-CM | POA: Insufficient documentation

## 2019-11-27 DIAGNOSIS — M545 Low back pain: Secondary | ICD-10-CM | POA: Diagnosis not present

## 2019-12-05 ENCOUNTER — Telehealth: Payer: Self-pay

## 2019-12-05 NOTE — Telephone Encounter (Signed)
Copied from [place] [id number]. Topic: General - Inquiry >> Dec 05, 2019 10:21 AM Thomas Morgan wrote: Reason for CRM: Pt called to inquire if he needs to come in and have blood work done to check his A1C /cholesterol / please advise

## 2019-12-05 NOTE — Telephone Encounter (Signed)
Correction: Appointment time is at 2:40pm.

## 2019-12-05 NOTE — Telephone Encounter (Signed)
Patient due for DM follow up office visit. Patient advised. Appointment scheduled for 12/24/2019 at 1:40pm.

## 2019-12-06 ENCOUNTER — Encounter: Payer: Self-pay | Admitting: Family Medicine

## 2019-12-06 ENCOUNTER — Telehealth: Payer: Self-pay | Admitting: Family Medicine

## 2019-12-06 NOTE — Telephone Encounter (Signed)
Please advise patient we got  A copy of the MRI ordered by Dr. Vertell Limber showing a slightly enlarged aorta. Is borderline for aneurysm. Will need to be monitored by checking ultrasound in 2 years (February 2023) to make it doesn't get larger.

## 2019-12-09 DIAGNOSIS — M48061 Spinal stenosis, lumbar region without neurogenic claudication: Secondary | ICD-10-CM | POA: Diagnosis not present

## 2019-12-09 NOTE — Telephone Encounter (Signed)
Patient advised.

## 2019-12-24 ENCOUNTER — Other Ambulatory Visit: Payer: Self-pay

## 2019-12-24 ENCOUNTER — Encounter: Payer: Self-pay | Admitting: Family Medicine

## 2019-12-24 ENCOUNTER — Ambulatory Visit: Payer: Medicare PPO | Admitting: Family Medicine

## 2019-12-24 VITALS — BP 114/76 | HR 79 | Temp 97.3°F | Wt 206.4 lb

## 2019-12-24 DIAGNOSIS — Z86010 Personal history of colonic polyps: Secondary | ICD-10-CM

## 2019-12-24 DIAGNOSIS — E1159 Type 2 diabetes mellitus with other circulatory complications: Secondary | ICD-10-CM | POA: Diagnosis not present

## 2019-12-24 DIAGNOSIS — I700 Atherosclerosis of aorta: Secondary | ICD-10-CM

## 2019-12-24 DIAGNOSIS — Z860101 Personal history of adenomatous and serrated colon polyps: Secondary | ICD-10-CM

## 2019-12-24 DIAGNOSIS — Z720 Tobacco use: Secondary | ICD-10-CM

## 2019-12-24 LAB — POCT UA - MICROALBUMIN: Microalbumin Ur, POC: >20 mg/L

## 2019-12-24 LAB — POCT GLYCOSYLATED HEMOGLOBIN (HGB A1C)
Est. average glucose Bld gHb Est-mCnc: 160
Hemoglobin A1C: 7.2 % — AB (ref 4.0–5.6)

## 2019-12-24 NOTE — Progress Notes (Signed)
Patient: Thomas Morgan Male    DOB: 1951-10-19   69 y.o.   MRN: [id number] Visit Date: 12/24/2019  Today's Provider: Lelon Huh, MD   Chief Complaint  Patient presents with  . Diabetes  . Hypertension   Subjective:     HPI  Diabetes Mellitus Type II, Follow-up:   Lab Results  Component Value Date   HGBA1C 6.9 (H) 04/09/2019   HGBA1C 6.7 (H) 07/31/2018   HGBA1C 6.8 (A) 03/28/2018    Last seen for diabetes 8 months ago.  Management since then includes no changes. He reports good compliance with treatment. He is not having side effects.  Current symptoms include none Home blood sugar records: fasting range: 130-140's  Episodes of hypoglycemia? no   Current insulin regiment: Is not on insulin Most Recent Eye Exam: not UTD Weight trend: decreasing steadily Prior visit with dietician: No Current exercise: none Current diet habits: well balanced  Pertinent Labs:    Component Value Date/Time   CHOL 147 04/09/2019 0821   TRIG 168 (H) 04/09/2019 0821   HDL 28 (L) 04/09/2019 0821   LDLCALC 85 04/09/2019 0821   CREATININE 0.90 04/09/2019 0821    Wt Readings from Last 3 Encounters:  12/24/19 206 lb 6.4 oz (93.6 kg)  11/08/19 213 lb (96.6 kg)  08/13/19 212 lb 12.8 oz (96.5 kg)    ------------------------------------------------------------------------  Hypertension, follow-up:  BP Readings from Last 3 Encounters:  12/24/19 114/76  08/13/19 112/75  04/08/19 126/72    He was last seen for hypertension 8 months ago.  BP at that visit was 126/72. Management since that visit includes no changes. He reports good compliance with treatment. He is not having side effects.  He is not exercising. He is adherent to low salt diet.   Outside blood pressures are not being checked at home. He is experiencing none.  Patient denies chest pain, chest pressure/discomfort, irregular heart beat and palpitations.   Cardiovascular risk factors include advanced age  (older than 50 for men, 51 for women), diabetes mellitus, hypertension and male gender.  Use of agents associated with hypertension: NSAIDS.     Weight trend: decreasing steadily Wt Readings from Last 3 Encounters:  12/24/19 206 lb 6.4 oz (93.6 kg)  11/08/19 213 lb (96.6 kg)  08/13/19 212 lb 12.8 oz (96.5 kg)    Current diet: well balanced  ------------------------------------------------------------------------  Allergies  Allergen Reactions  . Atorvastatin     Aching and feeling down  . Crestor [Rosuvastatin] Other (See Comments)    Muscle aches  . Hydrocodone Other (See Comments)    Palpitations, increased heart rate  . Lescol  [Fluvastatin]     Muscle Aches     Current Outpatient Medications:  .  aspirin (ASPIR-LOW) 81 MG EC tablet, Take 81 mg by mouth daily. , Disp: , Rfl:  .  ezetimibe (ZETIA) 10 MG tablet, TAKE 1 TABLET BY MOUTH EVERY DAY, Disp: 90 tablet, Rfl: 4 .  gabapentin (NEURONTIN) 300 MG capsule, Take 1 capsule (300 mg total) by mouth at bedtime. For sciatic nerve pain, Disp: 30 capsule, Rfl: 1 .  glucose blood (ONETOUCH VERIO) test strip, TEST ONCE DAILY  Dx: E11.9, Disp: 100 each, Rfl: 3 .  hydrochlorothiazide (HYDRODIURIL) 50 MG tablet, TAKE 1 TABLET BY MOUTH EVERY DAY, Disp: 90 tablet, Rfl: 4 .  lovastatin (MEVACOR) 40 MG tablet, TAKE 1 TABLET BY MOUTH EVERY DAY, Disp: 90 tablet, Rfl: 3 .  omeprazole (PRILOSEC) 40 MG capsule,  TK 1 C PO QD, Disp: , Rfl:  .  OneTouch Delica Lancets 99991111 MISC, Use to check blood sugar once a day.  DX: E11.9, Disp: 100 each, Rfl: 3 .  vitamin C (ASCORBIC ACID) 500 MG tablet, Take 500 mg by mouth 2 (two) times daily. , Disp: , Rfl:  .  buPROPion (WELLBUTRIN SR) 150 MG 12 hr tablet, Take 1 tablet (150 mg total) by mouth 2 (two) times daily. (Patient not taking: Reported on 04/02/2019), Disp: 180 tablet, Rfl: 1  Review of Systems  Constitutional: Negative for appetite change, chills and fever.  Respiratory: Negative for chest  tightness, shortness of breath and wheezing.   Cardiovascular: Negative for chest pain and palpitations.  Gastrointestinal: Negative for abdominal pain, nausea and vomiting.    Social History   Tobacco Use  . Smoking status: Current Every Day Smoker    Packs/day: 1.00    Years: 48.00    Pack years: 48.00    Types: Cigarettes  . Smokeless tobacco: Never Used  Substance Use Topics  . Alcohol use: No    Alcohol/week: 0.0 standard drinks      Objective:   BP 114/76 (BP Location: Left Arm, Patient Position: Sitting, Cuff Size: Normal)   Pulse 79   Temp (!) 97.3 F (36.3 C) (Temporal)   Wt 206 lb 6.4 oz (93.6 kg)   BMI 28.79 kg/m  Vitals:   12/24/19 1429  BP: 114/76  Pulse: 79  Temp: (!) 97.3 F (36.3 C)  TempSrc: Temporal  Weight: 206 lb 6.4 oz (93.6 kg)  Body mass index is 28.79 kg/m.   Physical Exam   General: Appearance:     Overweight male in no acute distress  Eyes:    PERRL, conjunctiva/corneas clear, EOM's intact       Lungs:     Clear to auscultation bilaterally, respirations unlabored  Heart:    Normal heart rate. Normal rhythm. No murmurs, rubs, or gallops.   MS:   All extremities are intact.   Neurologic:   Awake, alert, oriented x 3. No apparent focal neurological           defect.        Results for orders placed or performed in visit on 12/24/19  POCT HgB A1C  Result Value Ref Range   Hemoglobin A1C 7.2 (A) 4.0 - 5.6 %   Est. average glucose Bld gHb Est-mCnc 160   POCT UA - Microalbumin  Result Value Ref Range   Microalbumin Ur, POC >20 <50 mg/L       Assessment & Plan    1. Type 2 diabetes mellitus with other circulatory complication, without long-term current use of insulin (HCC) Well controlled.  Continue current medications.    2. Atherosclerosis of aorta (HCC) Asymptomatic. Compliant with medication.  Continue aggressive risk factor modification.    3. History of adenomatous polyp of colon Last colonoscopy was by Dr. Allen Norris in  11/2014 - Ambulatory referral to gastroenterology for colonoscopy  4. Tobacco abuse Was not able to quit with bupropion. Encouraged to continue to try to quit smoking.      Lelon Huh, MD  [facility]

## 2019-12-26 ENCOUNTER — Other Ambulatory Visit: Payer: Self-pay

## 2019-12-26 ENCOUNTER — Telehealth: Payer: Self-pay

## 2019-12-26 DIAGNOSIS — Z86010 Personal history of colonic polyps: Secondary | ICD-10-CM

## 2019-12-26 DIAGNOSIS — Z1211 Encounter for screening for malignant neoplasm of colon: Secondary | ICD-10-CM

## 2019-12-26 NOTE — Telephone Encounter (Signed)
Gastroenterology Pre-Procedure Review  Request Date: Monday 01/20/20 Requesting Physician: Dr. Marius Ditch  PATIENT REVIEW QUESTIONS: The patient responded to the following health history questions as indicated:    1. Are you having any GI issues? no 2. Do you have a personal history of Polyps? no 3. Do you have a family history of Colon Cancer or Polyps? no 4. Diabetes Mellitus? no 5. Joint replacements in the past 12 months?no 6. Major health problems in the past 3 months?no 7. Any artificial heart valves, MVP, or defibrillator?no    MEDICATIONS & ALLERGIES:    Patient reports the following regarding taking any anticoagulation/antiplatelet therapy:   Plavix, Coumadin, Eliquis, Xarelto, Lovenox, Pradaxa, Brilinta, or Effient? no Aspirin? no  Patient confirms/reports the following medications:  Current Outpatient Medications  Medication Sig Dispense Refill  . aspirin (ASPIR-LOW) 81 MG EC tablet Take 81 mg by mouth daily.     Marland Kitchen ezetimibe (ZETIA) 10 MG tablet TAKE 1 TABLET BY MOUTH EVERY DAY 90 tablet 4  . gabapentin (NEURONTIN) 300 MG capsule Take 1 capsule (300 mg total) by mouth at bedtime. For sciatic nerve pain 30 capsule 1  . glucose blood (ONETOUCH VERIO) test strip TEST ONCE DAILY  Dx: E11.9 100 each 3  . hydrochlorothiazide (HYDRODIURIL) 50 MG tablet TAKE 1 TABLET BY MOUTH EVERY DAY 90 tablet 4  . lovastatin (MEVACOR) 40 MG tablet TAKE 1 TABLET BY MOUTH EVERY DAY 90 tablet 3  . omeprazole (PRILOSEC) 40 MG capsule TK 1 C PO QD    . OneTouch Delica Lancets 99991111 MISC Use to check blood sugar once a day.  DX: E11.9 100 each 3  . vitamin C (ASCORBIC ACID) 500 MG tablet Take 500 mg by mouth 2 (two) times daily.      No current facility-administered medications for this visit.    Patient confirms/reports the following allergies:  Allergies  Allergen Reactions  . Atorvastatin     Aching and feeling down  . Crestor [Rosuvastatin] Other (See Comments)    Muscle aches  . Hydrocodone  Other (See Comments)    Palpitations, increased heart rate  . Lescol  [Fluvastatin]     Muscle Aches    No orders of the defined types were placed in this encounter.   AUTHORIZATION INFORMATION Primary Insurance: 1D#: Group #:  Secondary Insurance: 1D#: Group #:  SCHEDULE INFORMATION: Date: Monday 01/20/20 Time: Location:[facility]

## 2020-01-01 DIAGNOSIS — M4300 Spondylolysis, site unspecified: Secondary | ICD-10-CM | POA: Diagnosis not present

## 2020-01-01 DIAGNOSIS — M5416 Radiculopathy, lumbar region: Secondary | ICD-10-CM | POA: Diagnosis not present

## 2020-01-01 DIAGNOSIS — M48061 Spinal stenosis, lumbar region without neurogenic claudication: Secondary | ICD-10-CM | POA: Diagnosis not present

## 2020-01-01 DIAGNOSIS — M545 Low back pain: Secondary | ICD-10-CM | POA: Diagnosis not present

## 2020-01-06 ENCOUNTER — Other Ambulatory Visit: Payer: Self-pay | Admitting: Family Medicine

## 2020-01-06 DIAGNOSIS — I10 Essential (primary) hypertension: Secondary | ICD-10-CM

## 2020-01-06 NOTE — Telephone Encounter (Signed)
Requested Prescriptions  Pending Prescriptions Disp Refills  . hydrochlorothiazide (HYDRODIURIL) 50 MG tablet [Pharmacy Med Name: HYDROCHLOROTHIAZIDE 50MG  TABLETS] 90 tablet 4    Sig: TAKE 1 TABLET BY MOUTH EVERY DAY     Cardiovascular: Diuretics - Thiazide Passed - 01/06/2020  5:13 PM      Passed - Ca in normal range and within 360 days    Calcium  Date Value Ref Range Status  04/09/2019 9.2 8.6 - 10.2 mg/dL Final         Passed - Cr in normal range and within 360 days    Creatinine, Ser  Date Value Ref Range Status  04/09/2019 0.90 0.76 - 1.27 mg/dL Final         Passed - K in normal range and within 360 days    Potassium  Date Value Ref Range Status  04/09/2019 3.7 3.5 - 5.2 mmol/L Final         Passed - Na in normal range and within 360 days    Sodium  Date Value Ref Range Status  04/09/2019 143 134 - 144 mmol/L Final         Passed - Last BP in normal range    BP Readings from Last 1 Encounters:  12/24/19 114/76         Passed - Valid encounter within last 6 months    Recent Outpatient Visits          1 week ago Type 2 diabetes mellitus with other circulatory complication, without long-term current use of insulin [facility])   [facility] Birdie Sons, MD   4 months ago Bilateral sciatica   [facility] Birdie Sons, MD   9 months ago Prostate cancer screening   [facility] Birdie Sons, MD   1 year ago URI, acute   [facility], [place]   1 year ago Hyperlipidemia, unspecified hyperlipidemia type   [facility] Caryn Section, Kirstie Peri, MD

## 2020-01-13 ENCOUNTER — Telehealth: Payer: Self-pay | Admitting: Gastroenterology

## 2020-01-13 NOTE — Telephone Encounter (Signed)
Patient has requested to cancel his colonoscopy.  He doesn't feel its necessary.  01/20/20 colonoscopy has been canceled with Dr. Link Snuffer patients request.  Thanks,  Sharyn Lull, [place]

## 2020-01-13 NOTE — Telephone Encounter (Signed)
Patient called to cancel his colonoscopy. He does not feel it is necessary at this time. He will keep in touch with his primary.

## 2020-01-20 ENCOUNTER — Ambulatory Visit: Admit: 2020-01-20 | Payer: Medicare PPO | Admitting: Gastroenterology

## 2020-01-20 SURGERY — COLONOSCOPY WITH PROPOFOL
Anesthesia: General

## 2020-03-18 DIAGNOSIS — D2271 Melanocytic nevi of right lower limb, including hip: Secondary | ICD-10-CM | POA: Diagnosis not present

## 2020-03-18 DIAGNOSIS — D225 Melanocytic nevi of trunk: Secondary | ICD-10-CM | POA: Diagnosis not present

## 2020-03-18 DIAGNOSIS — L570 Actinic keratosis: Secondary | ICD-10-CM | POA: Diagnosis not present

## 2020-03-18 DIAGNOSIS — D2261 Melanocytic nevi of right upper limb, including shoulder: Secondary | ICD-10-CM | POA: Diagnosis not present

## 2020-03-18 DIAGNOSIS — X32XXXA Exposure to sunlight, initial encounter: Secondary | ICD-10-CM | POA: Diagnosis not present

## 2020-03-18 DIAGNOSIS — Z85828 Personal history of other malignant neoplasm of skin: Secondary | ICD-10-CM | POA: Diagnosis not present

## 2020-03-18 DIAGNOSIS — D2262 Melanocytic nevi of left upper limb, including shoulder: Secondary | ICD-10-CM | POA: Diagnosis not present

## 2020-03-24 DIAGNOSIS — M545 Low back pain: Secondary | ICD-10-CM | POA: Diagnosis not present

## 2020-04-02 ENCOUNTER — Ambulatory Visit: Payer: Medicare Other

## 2020-04-08 NOTE — Progress Notes (Signed)
Established patient visit   Patient: Thomas Morgan   DOB: [birth year]   69 y.o. Male  MRN: [id number] Visit Date: 04/10/2020  Today's healthcare provider: Lelon Huh, MD   Chief Complaint  Patient presents with  . Leg Pain  . Diabetes  . Hyperlipidemia    I,Latasha Walston,acting as a scribe for Lelon Huh, MD.,have documented all relevant documentation on the behalf of Lelon Huh, MD,as directed by  Lelon Huh, MD while in the presence of Lelon Huh, MD.  Subjective    Leg Pain  Incident onset: 3 months ago. There was no injury mechanism. The pain is present in the left leg and right leg. The quality of the pain is described as cramping. The pain has been worsening since onset. Associated symptoms include muscle weakness, numbness and tingling (left foot and toes). He reports no foreign bodies present. He has tried nothing for the symptoms.  He states that cramps occur after sitting or lying down for awhile. He has not leg pains, cramping or leg fatigue when ambulating and he has been very active having recently completed a mission trip to [place].   He also reports numb sensation along ulnar aspect of left hand and weakness and wasting around left thumb over the last 3 months. Is having trouble holding objects with his left hand when his thumb is required, but the rest of his fingers feel normal.   He is also noted to have lost 20 pounds since January. Denies any nausea, change in appetite, weakness or any GI changes, but he has been very active.   Diabetes Mellitus Type II, Follow-up  Lab Results  Component Value Date   HGBA1C 7.2 (A) 12/24/2019   HGBA1C 6.9 (H) 04/09/2019   HGBA1C 6.7 (H) 07/31/2018   Wt Readings from Last 3 Encounters:  04/10/20 195 lb (88.5 kg)  12/24/19 206 lb 6.4 oz (93.6 kg)  11/08/19 213 lb (96.6 kg)   Last seen for diabetes 3 months ago.  Management since then includes continue same medication. He reports good compliance with  treatment. He is not having side effects.  Symptoms: No fatigue No foot ulcerations  No appetite changes No nausea  Yes paresthesia of the feet  No polydipsia  No polyuria No visual disturbances   No vomiting     Home blood sugar records: fasting range: occasionally being checked averaging 130's  Episodes of hypoglycemia? No    Current insulin regiment: none Most Recent Eye Exam: not UTD Current exercise: none Current diet habits: diabetic  Pertinent Labs: Lab Results  Component Value Date   CHOL 147 04/09/2019   HDL 28 (L) 04/09/2019   LDLCALC 85 04/09/2019   TRIG 168 (H) 04/09/2019   CHOLHDL 5.3 (H) 04/09/2019   Lab Results  Component Value Date   NA 143 04/09/2019   K 3.7 04/09/2019   CREATININE 0.90 04/09/2019   GFRNONAA 87 04/09/2019   GFRAA 101 04/09/2019   GLUCOSE 131 (H) 04/09/2019     ---------------------------------------------------------------------------------------------------  Lipid/Cholesterol, Follow-up  Last lipid panel Other pertinent labs  Lab Results  Component Value Date   CHOL 147 04/09/2019   HDL 28 (L) 04/09/2019   LDLCALC 85 04/09/2019   TRIG 168 (H) 04/09/2019   CHOLHDL 5.3 (H) 04/09/2019   Lab Results  Component Value Date   ALT 17 04/09/2019   AST 13 04/09/2019   PLT 329 06/03/2016   TSH 4.080 06/03/2016     He was last seen for this  1 year ago.  Management since that visit includes continuing same medication.  He reports good compliance with treatment. He is not having side effects.   Symptoms: No chest pain No chest pressure/discomfort  No dyspnea No lower extremity edema  Yes numbness or tingling of extremity No orthopnea  No palpitations No paroxysmal nocturnal dyspnea  No speech difficulty No syncope   Current diet: diabetic Current exercise: none   The 10-year ASCVD risk score Mikey Bussing DC Jr., et al., 2013) is:  44.9%  ---------------------------------------------------------------------------------------------------     Medications: Outpatient Medications Prior to Visit  Medication Sig  . aspirin (ASPIR-LOW) 81 MG EC tablet Take 81 mg by mouth daily.   Marland Kitchen ezetimibe (ZETIA) 10 MG tablet TAKE 1 TABLET BY MOUTH EVERY DAY  . gabapentin (NEURONTIN) 300 MG capsule Take 1 capsule (300 mg total) by mouth at bedtime. For sciatic nerve pain  . glucose blood (ONETOUCH VERIO) test strip TEST ONCE DAILY  Dx: E11.9  . hydrochlorothiazide (HYDRODIURIL) 50 MG tablet TAKE 1 TABLET BY MOUTH EVERY DAY  . lovastatin (MEVACOR) 40 MG tablet TAKE 1 TABLET BY MOUTH EVERY DAY  . omeprazole (PRILOSEC) 40 MG capsule TK 1 C PO QD  . OneTouch Delica Lancets 08M MISC Use to check blood sugar once a day.  DX: E11.9  . traMADol (ULTRAM) 50 MG tablet   . vitamin C (ASCORBIC ACID) 500 MG tablet Take 500 mg by mouth 2 (two) times daily.  (Patient not taking: Reported on 04/10/2020)   No facility-administered medications prior to visit.    Review of Systems  Constitutional: Positive for unexpected weight change. Negative for appetite change, chills and fever.  Respiratory: Negative for chest tightness, shortness of breath and wheezing.   Cardiovascular: Negative for chest pain and palpitations.  Gastrointestinal: Negative for abdominal pain, nausea and vomiting.  Neurological: Positive for tingling (left foot and toes) and numbness.      Objective    BP 128/82 (BP Location: Right Arm, Patient Position: Sitting, Cuff Size: Normal)   Pulse 77   Temp (!) 96.8 F (36 C) (Temporal)   Wt 195 lb (88.5 kg)   BMI 27.20 kg/m    Physical Exam   General: Appearance:     Overweight male in no acute distress  Eyes:    PERRL, conjunctiva/corneas clear, EOM's intact       Lungs:     Clear to auscultation bilaterally, respirations unlabored  Vascular:    1+ bipedal pulses, cap refill about 3 seconds.   MS:   All extremities are  intact.   Neurologic:   Awake, alert, oriented x 3. Weakness of left thumb with obvious hypothenar wasting.         Assessment & Plan     1. Unexplained weight loss No other GI symptoms. He is up to date on LDCT screening, but overdue for follow up colonoscopy. Check labs as below.   2. Atrophy of muscle of left hand Refer neurology.   3. Leg cramping at rest No claudication symptoms. Check labs as below.   4. Type 2 diabetes mellitus with other circulatory complication, without long-term current use of insulin (HCC)  - HgB A1c  5. Hyperlipidemia, unspecified hyperlipidemia type  - Lipid panel - Comprehensive metabolic panel - Magnesium - CBC with Differential/Platelet - TSH - T4, free  6. Essential hypertension Well controlled.  Continue current medications.   - TSH  7. Prostate cancer screening  - PSA Total (Reflex To Free) ([facility] only)  8. Personal history of colonic polyps Again counseled that he is due for follow up colonoscopy and he states he not going to have it done.        The entirety of the information documented in the History of Present Illness, Review of Systems and Physical Exam were personally obtained by me. Portions of this information were initially documented by the CMA and reviewed by me for thoroughness and accuracy.      Lelon Huh, MD  [facility] [phone] (phone) [phone] (fax)  [place]

## 2020-04-10 ENCOUNTER — Other Ambulatory Visit: Payer: Self-pay

## 2020-04-10 ENCOUNTER — Ambulatory Visit: Payer: Medicare PPO | Admitting: Family Medicine

## 2020-04-10 ENCOUNTER — Encounter: Payer: Self-pay | Admitting: Family Medicine

## 2020-04-10 VITALS — BP 128/82 | HR 77 | Temp 96.8°F | Wt 195.0 lb

## 2020-04-10 DIAGNOSIS — R634 Abnormal weight loss: Secondary | ICD-10-CM | POA: Diagnosis not present

## 2020-04-10 DIAGNOSIS — Z86010 Personal history of colonic polyps: Secondary | ICD-10-CM

## 2020-04-10 DIAGNOSIS — R252 Cramp and spasm: Secondary | ICD-10-CM

## 2020-04-10 DIAGNOSIS — I10 Essential (primary) hypertension: Secondary | ICD-10-CM | POA: Diagnosis not present

## 2020-04-10 DIAGNOSIS — E785 Hyperlipidemia, unspecified: Secondary | ICD-10-CM | POA: Diagnosis not present

## 2020-04-10 DIAGNOSIS — Z125 Encounter for screening for malignant neoplasm of prostate: Secondary | ICD-10-CM | POA: Diagnosis not present

## 2020-04-10 DIAGNOSIS — M62542 Muscle wasting and atrophy, not elsewhere classified, left hand: Secondary | ICD-10-CM | POA: Diagnosis not present

## 2020-04-10 DIAGNOSIS — E1159 Type 2 diabetes mellitus with other circulatory complications: Secondary | ICD-10-CM | POA: Diagnosis not present

## 2020-04-10 NOTE — Patient Instructions (Signed)
.   Please review the attached list of medications and notify my office if there are any errors.   . You have had pre-cancerous colon polyps in 2012 and 2016. There is a high risk of developing colon cancer if you do not have follow up colonoscopies to find and remove any polyps that have developed since then.    You should get a call from our referral coordinator next week about a referral to the neurology clinic.

## 2020-04-11 LAB — LIPID PANEL
Chol/HDL Ratio: 3.8 ratio (ref 0.0–5.0)
Cholesterol, Total: 142 mg/dL (ref 100–199)
HDL: 37 mg/dL — ABNORMAL LOW (ref >39)
LDL Chol Calc (NIH): 90 mg/dL (ref 0–99)
Triglycerides: 79 mg/dL (ref 0–149)
VLDL Cholesterol Cal: 15 mg/dL (ref 5–40)

## 2020-04-11 LAB — MAGNESIUM: Magnesium: 2.1 mg/dL (ref 1.6–2.3)

## 2020-04-11 LAB — COMPREHENSIVE METABOLIC PANEL
ALT: 26 IU/L (ref 0–44)
AST: 13 IU/L (ref 0–40)
Albumin/Globulin Ratio: 2.6 — ABNORMAL HIGH (ref 1.2–2.2)
Albumin: 4.2 g/dL (ref 3.8–4.8)
Alkaline Phosphatase: 80 IU/L (ref 48–121)
BUN/Creatinine Ratio: 25 — ABNORMAL HIGH (ref 10–24)
BUN: 19 mg/dL (ref 8–27)
Bilirubin Total: 0.4 mg/dL (ref 0.0–1.2)
CO2: 22 mmol/L (ref 20–29)
Calcium: 9.3 mg/dL (ref 8.6–10.2)
Chloride: 104 mmol/L (ref 96–106)
Creatinine, Ser: 0.76 mg/dL (ref 0.76–1.27)
GFR calc Af Amer: 108 mL/min/{1.73_m2} (ref >59)
GFR calc non Af Amer: 93 mL/min/{1.73_m2} (ref >59)
Globulin, Total: 1.6 g/dL (ref 1.5–4.5)
Glucose: 186 mg/dL — ABNORMAL HIGH (ref 65–99)
Potassium: 3.6 mmol/L (ref 3.5–5.2)
Sodium: 141 mmol/L (ref 134–144)
Total Protein: 5.8 g/dL — ABNORMAL LOW (ref 6.0–8.5)

## 2020-04-11 LAB — CBC WITH DIFFERENTIAL/PLATELET
Basophils Absolute: 0.1 10*3/uL (ref 0.0–0.2)
Basos: 1 %
EOS (ABSOLUTE): 0.3 10*3/uL (ref 0.0–0.4)
Eos: 2 %
Hematocrit: 42.4 % (ref 37.5–51.0)
Hemoglobin: 14.5 g/dL (ref 13.0–17.7)
Immature Grans (Abs): 0.1 10*3/uL (ref 0.0–0.1)
Immature Granulocytes: 1 %
Lymphocytes Absolute: 1.7 10*3/uL (ref 0.7–3.1)
Lymphs: 14 %
MCH: 32 pg (ref 26.6–33.0)
MCHC: 34.2 g/dL (ref 31.5–35.7)
MCV: 94 fL (ref 79–97)
Monocytes Absolute: 0.7 10*3/uL (ref 0.1–0.9)
Monocytes: 6 %
Neutrophils Absolute: 8.9 10*3/uL — ABNORMAL HIGH (ref 1.4–7.0)
Neutrophils: 76 %
Platelets: 317 10*3/uL (ref 150–450)
RBC: 4.53 x10E6/uL (ref 4.14–5.80)
RDW: 12.3 % (ref 11.6–15.4)
WBC: 11.6 10*3/uL — ABNORMAL HIGH (ref 3.4–10.8)

## 2020-04-11 LAB — PSA TOTAL (REFLEX TO FREE): Prostate Specific Ag, Serum: 0.4 ng/mL (ref 0.0–4.0)

## 2020-04-11 LAB — HEMOGLOBIN A1C
Est. average glucose Bld gHb Est-mCnc: 151 mg/dL
Hgb A1c MFr Bld: 6.9 % — ABNORMAL HIGH (ref 4.8–5.6)

## 2020-04-11 LAB — T4, FREE: Free T4: 1.25 ng/dL (ref 0.82–1.77)

## 2020-04-11 LAB — TSH: TSH: 2.78 u[IU]/mL (ref 0.450–4.500)

## 2020-04-20 NOTE — Progress Notes (Signed)
Complete physical exam   Patient: Thomas Morgan   DOB: December 21, 1950   69 y.o. Male  MRN: [id number] Visit Date: 04/21/2020  Today's healthcare provider: Lelon Huh, MD   Chief Complaint  Patient presents with  . Annual Exam  . Diabetes  . Hyperlipidemia   Subjective    Thomas Morgan is a 69 y.o. male who presents today for a complete physical exam.  He reports consuming a general diet. The patient does not participate in regular exercise at present. He generally feels fairly well. He reports sleeping fairly well. He does have additional problems to discuss today.   Had AWV with [facility] today at 8:20am  HPI  Diabetes Mellitus Type II, Follow-up  Lab Results  Component Value Date   HGBA1C 6.9 (H) 04/10/2020   HGBA1C 7.2 (A) 12/24/2019   HGBA1C 6.9 (H) 04/09/2019   Wt Readings from Last 3 Encounters:  04/21/20 196 lb 9.6 oz (89.2 kg)  04/10/20 195 lb (88.5 kg)  12/24/19 206 lb 6.4 oz (93.6 kg)   Last seen for diabetes 10 days ago.  Management since then includes ordering labs. Results to be reviewed during todays office visit.  He reports good compliance with treatment. His wife is insulin dependent diabetic so he is familiar with diabetic diet. He does have meter and checks sugar every few weeks.  He is not having side effects.  Symptoms: Yes fatigue No foot ulcerations  No appetite changes No nausea  Yes paresthesia of the feet  No polydipsia  No polyuria No visual disturbances   No vomiting     Home blood sugar records: blood sugars are not checked regularly  Episodes of hypoglycemia? No   Pertinent Labs: Lab Results  Component Value Date   CHOL 142 04/10/2020   HDL 37 (L) 04/10/2020   LDLCALC 90 04/10/2020   TRIG 79 04/10/2020   CHOLHDL 3.8 04/10/2020   Lab Results  Component Value Date   NA 141 04/10/2020   K 3.6 04/10/2020   CREATININE 0.76 04/10/2020   GFRNONAA 93 04/10/2020   GFRAA 108 04/10/2020   GLUCOSE 186 (H) 04/10/2020      ---------------------------------------------------------------------------------------------------  Lipid/Cholesterol, Follow-up  Last lipid panel Other pertinent labs  Lab Results  Component Value Date   CHOL 142 04/10/2020   HDL 37 (L) 04/10/2020   LDLCALC 90 04/10/2020   TRIG 79 04/10/2020   CHOLHDL 3.8 04/10/2020   Lab Results  Component Value Date   ALT 26 04/10/2020   AST 13 04/10/2020   PLT 317 04/10/2020   TSH 2.780 04/10/2020     He was last seen for this 10 days ago.  Management since that visit includes ordering labs; will review results during today's office visit.  He reports good compliance with treatment. He is not having side effects.   Symptoms: No chest pain No chest pressure/discomfort  No dyspnea No lower extremity edema  Yes numbness or tingling of extremity No orthopnea  No palpitations No paroxysmal nocturnal dyspnea  No speech difficulty No syncope   Current diet: well balanced Current exercise: none  The 10-year ASCVD risk score Mikey Bussing DC Jr., et al., 2013) is: 43.2%  --------------------------------------------------------------------------------------------------- He had complete lab evaluation earlier this month which was unremarkable and reviewed today.   Past Medical History:  Diagnosis Date  . Basal cell carcinoma   . Complication of anesthesia   . Personal history of tobacco use, presenting hazards to health 10/22/2015  . PONV (postoperative nausea  and vomiting)    gets very sick from anesthesia  . Spinal stenosis    Past Surgical History:  Procedure Laterality Date  . BASAL CELL CARCINOMA EXCISION Left 2020   left arm  . CYST REMOVAL TRUNK  2020   on his back  . ELBOW SURGERY    . 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 SKIN CANCER EXCISION    . WRIST SURGERY Right    Trauma resulting in  prosthetics in the right wrist and elbow   Social History   Socioeconomic History  . Marital status: Married    Spouse name: Not on file  . Number of children: 2  . Years of education: Not on file  . Highest education level: Bachelor's degree (e.g., BA, AB, BS)  Occupational History  . Not on file  Tobacco Use  . Smoking status: Current Every Day Smoker    Packs/day: 1.00    Years: 48.00    Pack years: 48.00    Types: Cigarettes  . Smokeless tobacco: Never Used  Vaping Use  . Vaping Use: Never used  Substance and Sexual Activity  . Alcohol use: No    Alcohol/week: 0.0 standard drinks  . Drug use: No  . Sexual activity: Not on file  Other Topics Concern  . Not on file  Social History Narrative  . Not on file   Social Determinants of Health   Financial Resource Strain: Low Risk   . Difficulty of Paying Living Expenses: Not hard at all  Food Insecurity: No Food Insecurity  . Worried About Charity fundraiser in the Last Year: Never true  . Ran Out of Food in the Last Year: Never true  Transportation Needs: No Transportation Needs  . Lack of Transportation (Medical): No  . Lack of Transportation (Non-Medical): No  Physical Activity: Inactive  . Days of Exercise per Week: 0 days  . Minutes of Exercise per Session: 0 min  Stress: No Stress Concern Present  . Feeling of Stress : Not at all  Social Connections: Moderately Integrated  . Frequency of Communication with Friends and Family: More than three times a week  . Frequency of Social Gatherings with Friends and Family: More than three times a week  . Attends Religious Services: More than 4 times per year  . Active Member of Clubs or Organizations: No  . Attends Archivist Meetings: Never  . Marital Status: Married  Human resources officer Violence: Not At Risk  . Fear of Current or Ex-Partner: No  . Emotionally Abused: No  . Physically Abused: No  . Sexually Abused: No   Family Status  Relation Name Status   . Mother  Deceased at age 32       died from lung cancer;colorectal adenoma  . Father  Deceased at age 51       AAA rupture  . Brother  Alive  . Brother  Alive   Family History  Problem Relation Age of Onset  . Cancer Mother        Lung  . Hypertension Mother   . Heart Problems Father   . AAA (abdominal aortic aneurysm) Father   . Heart attack Brother   . Hypercholesterolemia Brother    Allergies  Allergen Reactions  . Atorvastatin     Aching and feeling down  . Crestor [Rosuvastatin] Other (See Comments)  Muscle aches  . Hydrocodone Other (See Comments)    Palpitations, increased heart rate  . Lescol  [Fluvastatin]     Muscle Aches    Patient Care Team: Birdie Sons, MD as PCP - General (Family Medicine) Vladimir Crofts, MD as Consulting Physician (Neurology) Erline Levine, MD as Consulting Physician (Neurosurgery)   Medications: Outpatient Medications Prior to Visit  Medication Sig  . aspirin (ASPIR-LOW) 81 MG EC tablet Take 81 mg by mouth daily.   Marland Kitchen ezetimibe (ZETIA) 10 MG tablet TAKE 1 TABLET BY MOUTH EVERY DAY  . gabapentin (NEURONTIN) 300 MG capsule Take 1 capsule (300 mg total) by mouth at bedtime. For sciatic nerve pain (Patient not taking: Reported on 04/21/2020)  . glucose blood (ONETOUCH VERIO) test strip TEST ONCE DAILY  Dx: E11.9  . hydrochlorothiazide (HYDRODIURIL) 50 MG tablet TAKE 1 TABLET BY MOUTH EVERY DAY  . lovastatin (MEVACOR) 40 MG tablet TAKE 1 TABLET BY MOUTH EVERY DAY  . omeprazole (PRILOSEC) 40 MG capsule TK 1 C PO QD  . OneTouch Delica Lancets 93T MISC Use to check blood sugar once a day.  DX: E11.9  . traMADol (ULTRAM) 50 MG tablet Take 50 mg by mouth every 6 (six) hours as needed.    No facility-administered medications prior to visit.    Review of Systems  Constitutional: Positive for fatigue and unexpected weight change. Negative for appetite change, chills and fever.  HENT: Positive for congestion and sinus pressure. Negative  for ear pain, hearing loss, nosebleeds and trouble swallowing.   Eyes: Negative for pain and visual disturbance.  Respiratory: Negative for cough, chest tightness, shortness of breath and wheezing.   Cardiovascular: Negative for chest pain, palpitations and leg swelling.  Gastrointestinal: Negative for abdominal pain, blood in stool, constipation, diarrhea, nausea and vomiting.  Endocrine: Negative for polydipsia, polyphagia and polyuria.  Genitourinary: Positive for frequency. Negative for dysuria and flank pain.  Musculoskeletal: Positive for back pain. Negative for arthralgias, joint swelling, myalgias and neck stiffness.  Skin: Negative for color change, rash and wound.  Allergic/Immunologic: Positive for environmental allergies.  Neurological: Positive for weakness and numbness. Negative for dizziness, tremors, seizures, speech difficulty, light-headedness and headaches.  Psychiatric/Behavioral: Negative for behavioral problems, confusion, decreased concentration, dysphoric mood and sleep disturbance. The patient is not nervous/anxious.   All other systems reviewed and are negative.     Objective    There were no vitals taken for this visit.  BASIC VITALS 04/21/2020  BP 136/82  Site Right Arm  Patient Position   Pulse 70  Resp   Temp 97.8  Height 5\' 11"   Weight 196 lbs 10 oz  BMI (Calculated) 27.43  OXIMETRY 04/21/2020  SpO2 98  ORTHOSTATICS   Patient Position    Physical Exam   General Appearance:     Overweight male. Alert, cooperative, in no acute distress, appears stated age  Head:    Normocephalic, without obvious abnormality, atraumatic  Eyes:    PERRL, conjunctiva/corneas clear, EOM's intact, fundi    benign, both eyes       Ears:    Normal TM's and external ear canals, both ears  Neck:   Supple, symmetrical, trachea midline, no adenopathy;       thyroid:  No enlargement/tenderness/nodules; no carotid   bruit or JVD  Back:     Symmetric, no curvature, ROM  normal, no CVA tenderness  Lungs:     Clear to auscultation bilaterally, respirations unlabored  Chest wall:    No  tenderness or deformity  Heart:    Normal heart rate. Normal rhythm. No murmurs, rubs, or gallops.  S1 and S2 normal  Abdomen:     Soft, non-tender, bowel sounds active all four quadrants,    no organomegaly. Aorta is palpable, estimate 3-4 cm. Slightly tender aorta.   Genitalia:    deferred  Rectal:    deferred  Extremities:   All extremities are intact. No cyanosis or edema  Pulses:   2+ and symmetric all extremities  Skin:   Skin color, texture, turgor normal, no rashes or lesions  Lymph nodes:   Cervical, supraclavicular, and axillary nodes normal  Neurologic:   CNII-XII intact. Normal strength, sensation and reflexes      throughout    Diabetic Foot Exam - Simple   Simple Foot Form Diabetic Foot exam was performed with the following findings: Yes 04/21/2020  9:48 AM  Visual Inspection No deformities, no ulcerations, no other skin breakdown bilaterally: Yes Sensation Testing Intact to touch and monofilament testing bilaterally: Yes Pulse Check Posterior Tibialis and Dorsalis pulse intact bilaterally: Yes Comments Slightly diminished sensation both distal feet.       Last depression screening scores PHQ 2/9 Scores 04/21/2020 04/10/2020 04/02/2019  PHQ - 2 Score 0 0 0  PHQ- 9 Score - - -   Last fall risk screening Fall Risk  04/21/2020  Falls in the past year? 0  Number falls in past yr: 0  Injury with Fall? 0   Last Audit-C alcohol use screening Alcohol Use Disorder Test (AUDIT) 04/21/2020  1. How often do you have a drink containing alcohol? 0  2. How many drinks containing alcohol do you have on a typical day when you are drinking? 0  3. How often do you have six or more drinks on one occasion? 0  AUDIT-C Score 0  Alcohol Brief Interventions/Follow-up AUDIT Score <7 follow-up not indicated   A score of 3 or more in women, and 4 or more in men indicates  increased risk for alcohol abuse, EXCEPT if all of the points are from question 1   No results found for any visits on 04/21/20.  Assessment & Plan    Routine Health Maintenance and Physical Exam  Exercise Activities and Dietary recommendations Goals    . Exercise 150 min/wk Moderate Activity     Recommend to continue exercising 3 days a week for 1 hour.     . Quit Smoking     Recommend to continue efforts to reduce smoking habits until no longer smoking.        Immunization History  Administered Date(s) Administered  . Fluad Quad(high Dose 65+) 08/13/2019  . Influenza, High Dose Seasonal PF 10/08/2015, 07/05/2016, 07/27/2018  . Influenza-Unspecified 12/01/2017  . Pneumococcal Conjugate-13 10/08/2015  . Pneumococcal Polysaccharide-23 09/10/2008, 12/12/2016  . Td 10/24/1997  . Tdap 06/06/2007    Health Maintenance  Topic Date Due  . COVID-19 Vaccine (1) Never done  . FOOT EXAM  10/07/2016  . OPHTHALMOLOGY EXAM  07/24/2019  . COLONOSCOPY  12/20/2019  . TETANUS/TDAP  08/12/2024 (Originally 06/05/2017)  . INFLUENZA VACCINE  05/24/2020  . HEMOGLOBIN A1C  10/10/2020  . URINE MICROALBUMIN  12/23/2020  . Hepatitis C Screening  Completed  . PNA vac Low Risk Adult  Completed    Discussed health benefits of physical activity, and encouraged him to engage in regular exercise appropriate for his age and condition.  1. Annual physical exam Generally doing well.   2. Type 2 diabetes  mellitus with other circulatory complication, without long-term current use of insulin (HCC) Diet controlled.  - Ambulatory referral to Ophthalmology  3. Aortic aneurysm without rupture, unspecified portion of aorta ([place]) Due for follow up - US AORTA; Future  4. Essential hypertension Well controlled.  Continue current medications.   - EKG 12-Lead  5. Tobacco abuse Encouraged smoking cessation. He has tried Chantix in the past and had no adverse reactions, but he doesn't think it helped  because he didn't really want to quit, and still doesn't.   6. History of adenomatous polyp of colon Reminded he is due for and recommended that he proceed with follow up colonoscopy, but he states he's not going to do it.   Follow up for diabetes about 4 months.      The entirety of the information documented in the History of Present Illness, Review of Systems and Physical Exam were personally obtained by me. Portions of this information were initially documented by the CMA and reviewed by me for thoroughness and accuracy.      Lelon Huh, MD  [facility] [phone] (phone) [phone] (fax)  [place]

## 2020-04-20 NOTE — Progress Notes (Signed)
Subjective:   Thomas Morgan is a 69 y.o. male who presents for an Initial Medicare Annual Wellness Visit.  Review of Systems    N/A  Cardiac Risk Factors include: advanced age (>40men, >22 women);dyslipidemia;male gender;hypertension;smoking/ tobacco exposure     Objective:    Today's Vitals   04/21/20 0815  BP: 136/82  Pulse: 70  Temp: 97.8 F (36.6 C)  TempSrc: Temporal  SpO2: 98%  Weight: 196 lb 9.6 oz (89.2 kg)  Height: 5\' 11"  (1.803 m)  PainSc: 0-No pain   Body mass index is 27.42 kg/m.  Advanced Directives 04/21/2020 04/02/2019 04/02/2019 12/22/2017 03/16/2017  Does Patient Have a Medical Advance Directive? Yes Yes Yes Yes Yes  Type of Paramedic of [place];Living will [place];Living will [place];Living will [place];Living will [place];Living will  Does patient want to make changes to medical advance directive? - - - - No - Patient declined  Copy of [place] in Chart? No - copy requested No - copy requested No - copy requested No - copy requested -  Would patient like information on creating a medical advance directive? No - Patient declined - - - -    Current Medications (verified) Outpatient Encounter Medications as of 04/21/2020  Medication Sig  . aspirin (ASPIR-LOW) 81 MG EC tablet Take 81 mg by mouth daily.   Marland Kitchen ezetimibe (ZETIA) 10 MG tablet TAKE 1 TABLET BY MOUTH EVERY DAY  . glucose blood (ONETOUCH VERIO) test strip TEST ONCE DAILY  Dx: E11.9  . hydrochlorothiazide (HYDRODIURIL) 50 MG tablet TAKE 1 TABLET BY MOUTH EVERY DAY  . lovastatin (MEVACOR) 40 MG tablet TAKE 1 TABLET BY MOUTH EVERY DAY  . omeprazole (PRILOSEC) 40 MG capsule TK 1 C PO QD  . OneTouch Delica Lancets 57S MISC Use to check blood sugar once a day.  DX: E11.9  . traMADol (ULTRAM) 50 MG tablet Take 50 mg by mouth every 6 (six) hours as needed.   . gabapentin (NEURONTIN)  300 MG capsule Take 1 capsule (300 mg total) by mouth at bedtime. For sciatic nerve pain (Patient not taking: Reported on 04/21/2020)   No facility-administered encounter medications on file as of 04/21/2020.    Allergies (verified) Atorvastatin, Crestor [rosuvastatin], Hydrocodone, and Lescol  [fluvastatin]   History: Past Medical History:  Diagnosis Date  . Basal cell carcinoma   . Complication of anesthesia   . Personal history of tobacco use, presenting hazards to health 10/22/2015  . PONV (postoperative nausea and vomiting)    gets very sick from anesthesia  . Spinal stenosis    Past Surgical History:  Procedure Laterality Date  . BASAL CELL CARCINOMA EXCISION Left 2020   left arm  . CYST REMOVAL TRUNK  2020   on his back  . ELBOW SURGERY    . 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 SKIN CANCER EXCISION    . WRIST SURGERY Right    Trauma resulting in prosthetics in the right wrist and elbow   Family History  Problem Relation Age of Onset  . Cancer Mother        Lung  . Hypertension Mother   . Heart Problems Father   . AAA (abdominal aortic aneurysm) Father   . Heart attack Brother   . Hypercholesterolemia Brother  Social History   Socioeconomic History  . Marital status: Married    Spouse name: Not on file  . Number of children: 2  . Years of education: Not on file  . Highest education level: Bachelor's degree (e.g., BA, AB, BS)  Occupational History  . Not on file  Tobacco Use  . Smoking status: Current Every Day Smoker    Packs/day: 1.00    Years: 48.00    Pack years: 48.00    Types: Cigarettes  . Smokeless tobacco: Never Used  Vaping Use  . Vaping Use: Never used  Substance and Sexual Activity  . Alcohol use: No    Alcohol/week: 0.0 standard drinks  . Drug use: No  . Sexual activity: Not on file  Other Topics  Concern  . Not on file  Social History Narrative  . Not on file   Social Determinants of Health   Financial Resource Strain: Low Risk   . Difficulty of Paying Living Expenses: Not hard at all  Food Insecurity: No Food Insecurity  . Worried About Charity fundraiser in the Last Year: Never true  . Ran Out of Food in the Last Year: Never true  Transportation Needs: No Transportation Needs  . Lack of Transportation (Medical): No  . Lack of Transportation (Non-Medical): No  Physical Activity: Inactive  . Days of Exercise per Week: 0 days  . Minutes of Exercise per Session: 0 min  Stress: No Stress Concern Present  . Feeling of Stress : Not at all  Social Connections: Moderately Integrated  . Frequency of Communication with Friends and Family: More than three times a week  . Frequency of Social Gatherings with Friends and Family: More than three times a week  . Attends Religious Services: More than 4 times per year  . Active Member of Clubs or Organizations: No  . Attends Archivist Meetings: Never  . Marital Status: Married    Tobacco Counseling Ready to quit: No Counseling given: No   Clinical Intake:  Pre-visit preparation completed: Yes  Pain : No/denies pain Pain Score: 0-No pain     Nutritional Status: BMI 25 -29 Overweight Nutritional Risks: None Diabetes: Yes (Pre-diabetic)  How often do you need to have someone help you when you read instructions, pamphlets, or other written materials from your doctor or pharmacy?: 1 - Never  Diabetic? Pre-diabetic  Nutrition Risk Assessment:  Has the patient had any N/V/D within the last 2 months?  No  Does the patient have any non-healing wounds?  No  Has the patient had any unintentional weight loss or weight gain?  No   Diabetes:  Is the patient diabetic?  Yes prediabetic If diabetic, was a CBG obtained today?  No  Did the patient bring in their glucometer from home?  No  How often do you monitor your  CBG's? Once a month.   Financial Strains and Diabetes Management:  Are you having any financial strains with the device, your supplies or your medication? No .  Does the patient want to be seen by Chronic Care Management for management of their diabetes?  No  Would the patient like to be referred to a Nutritionist or for Diabetic Management?  No   Diabetic Exams:  Diabetic Eye Exam: Overdue for diabetic eye exam. Pt has been advised about the importance in completing this exam. Patient advised to call and schedule an eye exam. Pt is planning to set up an eye exam this year. Diabetic Foot Exam:  Overdue, Pt has been advised about the importance in completing this exam. Note made to follow up on this at today's in office apt.   Interpreter Needed?: No  Information entered by :: [facility], LPN   Activities of Daily Living In your present state of health, do you have any difficulty performing the following activities: 04/21/2020 04/10/2020  Hearing? N N  Vision? N N  Difficulty concentrating or making decisions? N N  Walking or climbing stairs? N N  Dressing or bathing? N N  Doing errands, shopping? N N  Preparing Food and eating ? N -  Using the Toilet? N -  In the past six months, have you accidently leaked urine? N -  Do you have problems with loss of bowel control? N -  Managing your Medications? N -  Managing your Finances? N -  Housekeeping or managing your Housekeeping? N -  Some recent data might be hidden    Patient Care Team: Birdie Sons, MD as PCP - General (Family Medicine) Vladimir Crofts, MD as Consulting Physician (Neurology) Erline Levine, MD as Consulting Physician (Neurosurgery)  Indicate any recent [place] you may have received from other than [facility] providers in the past year (date may be approximate).     Assessment:   This is a routine wellness examination for [place].  Hearing/Vision screen No exam data present  Dietary issues and exercise  activities discussed: Current Exercise Habits: The patient does not participate in regular exercise at present, Exercise limited by: None identified  Goals    . Exercise 150 min/wk Moderate Activity     Recommend to continue exercising 3 days a week for 1 hour.     . Quit Smoking     Recommend to continue efforts to reduce smoking habits until no longer smoking.       Depression Screen PHQ 2/9 Scores 04/21/2020 04/10/2020 04/02/2019 04/02/2019 10/30/2018 12/22/2017 12/22/2017  PHQ - 2 Score 0 0 0 0 0 0 0  PHQ- 9 Score - - - - - 0 -    Fall Risk Fall Risk  04/21/2020 04/10/2020 08/13/2019 04/02/2019 04/02/2019  Falls in the past year? 0 0 1 1 0  Number falls in past yr: 0 0 0 0 -  Injury with Fall? 0 0 0 0 -    Any stairs in or around the home? No  If so, are there any without handrails? N/A Home free of loose throw rugs in walkways, pet beds, electrical cords, etc? Yes  Adequate lighting in your home to reduce risk of falls? Yes   ASSISTIVE DEVICES UTILIZED TO PREVENT FALLS:  Life alert? No  Use of a cane, walker or w/c? No  Grab bars in the bathroom? No  Shower chair or bench in shower? No  Elevated toilet seat or a handicapped toilet? No   TIMED UP AND GO:  Was the test performed? Yes .  Length of time to ambulate 10 feet: 6 sec.   Gait steady and fast without use of assistive device  Cognitive Function: Declined today.     6CIT Screen 04/02/2019 04/02/2019  What Year? 0 points 0 points  What month? 0 points 0 points  What time? 0 points 0 points  Count back from 20 0 points 0 points  Months in reverse 0 points 0 points  Repeat phrase 2 points 2 points  Total Score 2 2    Immunizations Immunization History  Administered Date(s) Administered  . Fluad Quad(high Dose 65+)  08/13/2019  . Influenza, High Dose Seasonal PF 10/08/2015, 07/05/2016, 07/27/2018  . Influenza-Unspecified 12/01/2017  . Pneumococcal Conjugate-13 10/08/2015  . Pneumococcal Polysaccharide-23 09/10/2008,  12/12/2016  . Td 10/24/1997  . Tdap 06/06/2007    TDAP status: Due, Education has been provided regarding the importance of this vaccine. Advised may receive this vaccine at local pharmacy or Health Dept. Aware to provide a copy of the vaccination record if obtained from local pharmacy or Health Dept. Verbalized acceptance and understanding. Flu Vaccine status: Up to date Pneumococcal vaccine status: Up to date Covid-19 vaccine status: Completed vaccines  Qualifies for Shingles Vaccine? Yes   Zostavax completed No   Shingrix Completed?: No.    Education has been provided regarding the importance of this vaccine. Patient has been advised to call insurance company to determine out of pocket expense if they have not yet received this vaccine. Advised may also receive vaccine at local pharmacy or Health Dept. Verbalized acceptance and understanding.  Screening Tests Health Maintenance  Topic Date Due  . COVID-19 Vaccine (1) Never done  . FOOT EXAM  10/07/2016  . OPHTHALMOLOGY EXAM  07/24/2019  . COLONOSCOPY  12/20/2019  . TETANUS/TDAP  08/12/2024 (Originally 06/05/2017)  . INFLUENZA VACCINE  05/24/2020  . HEMOGLOBIN A1C  10/10/2020  . URINE MICROALBUMIN  12/23/2020  . Hepatitis C Screening  Completed  . PNA vac Low Risk Adult  Completed    Health Maintenance  Health Maintenance Due  Topic Date Due  . COVID-19 Vaccine (1) Never done  . FOOT EXAM  10/07/2016  . OPHTHALMOLOGY EXAM  07/24/2019  . COLONOSCOPY  12/20/2019    Colorectal cancer screening: Currently due. Declined a colonoscopy referral or cologuard order at this time.   Lung Cancer Screening: (Low Dose CT Chest recommended if Age 62-80 years, 30 pack-year currently smoking OR have quit w/in 15years.) does qualify however had this completed 11/11/19. Repeat yearly.  Additional Screening:  Hepatitis C Screening: Up to date  Vision Screening: Recommended annual ophthalmology exams for early detection of glaucoma and  other disorders of the eye. Is the patient up to date with their annual eye exam? No, pt to find a new doctor and set up apt this year. Who is the provider or what is the name of the office in which the patient attends annual eye exams? N/A If pt is not established with a provider, would they like to be referred to a provider to establish care? No .   Dental Screening: Recommended annual dental exams for proper oral hygiene  Community Resource Referral / Chronic Care Management: CRR required this visit?  No   CCM required this visit?  No      Plan:     I have personally reviewed and noted the following in the patient's chart:   . Medical and social history . Use of alcohol, tobacco or illicit drugs  . Current medications and supplements . Functional ability and status . Nutritional status . Physical activity . Advanced directives . List of other physicians . Hospitalizations, surgeries, and ER visits in previous 12 months . Vitals . Screenings to include cognitive, depression, and falls . Referrals and appointments  In addition, I have reviewed and discussed with patient certain preventive protocols, quality metrics, and best practice recommendations. A written personalized care plan for preventive services as well as general preventive health recommendations were provided to patient.     Kalley Nicholl [place], [place]   0/07/2724   Nurse Notes: Pt needs a diabetic foot  exam today. Pt plans to schedule an eye exam this year. Pt has received his COVID vaccines. Requested card to update chart. Pt declined a colonoscopy referral or a cologuard order at this time.

## 2020-04-21 ENCOUNTER — Ambulatory Visit (INDEPENDENT_AMBULATORY_CARE_PROVIDER_SITE_OTHER): Payer: Medicare PPO | Admitting: Family Medicine

## 2020-04-21 ENCOUNTER — Other Ambulatory Visit: Payer: Self-pay

## 2020-04-21 ENCOUNTER — Ambulatory Visit (INDEPENDENT_AMBULATORY_CARE_PROVIDER_SITE_OTHER): Payer: Medicare PPO

## 2020-04-21 VITALS — BP 136/82 | HR 70 | Temp 97.8°F | Ht 71.0 in | Wt 196.6 lb

## 2020-04-21 DIAGNOSIS — Z0000 Encounter for general adult medical examination without abnormal findings: Secondary | ICD-10-CM | POA: Diagnosis not present

## 2020-04-21 DIAGNOSIS — Z86010 Personal history of colonic polyps: Secondary | ICD-10-CM | POA: Diagnosis not present

## 2020-04-21 DIAGNOSIS — I719 Aortic aneurysm of unspecified site, without rupture: Secondary | ICD-10-CM

## 2020-04-21 DIAGNOSIS — Z720 Tobacco use: Secondary | ICD-10-CM | POA: Diagnosis not present

## 2020-04-21 DIAGNOSIS — E1159 Type 2 diabetes mellitus with other circulatory complications: Secondary | ICD-10-CM

## 2020-04-21 DIAGNOSIS — I10 Essential (primary) hypertension: Secondary | ICD-10-CM

## 2020-04-21 NOTE — Patient Instructions (Signed)
Thomas Morgan , Thank you for taking time to come for your Medicare Wellness Visit. I appreciate your ongoing commitment to your health goals. Please review the following plan we discussed and let me know if I can assist you in the future.   Screening recommendations/referrals: Colonoscopy: Currently due. Declined a colonoscopy referral or a cologuard order at this time.  Recommended yearly ophthalmology/optometry visit for glaucoma screening and checkup Recommended yearly dental visit for hygiene and checkup  Vaccinations: Influenza vaccine: Done 08/13/19 Pneumococcal vaccine: Completed series Tdap vaccine: Currently due, declined today.  Shingles vaccine: Currently due, declined today.     Advanced directives: Please bring a copy of your POA (Power of Attorney) and/or Living Will to your next appointment.   Conditions/risks identified: Smoking cessation discussed today. Recommend to start exercising 3 days a week for at least 30 minutes at a time.   Next appointment: 9:00 AM today with Dr Caryn Section   Preventive Care 64 Years and Older, Male Preventive care refers to lifestyle choices and visits with your health care provider that can promote health and wellness. What does preventive care include?  A yearly physical exam. This is also called an annual well check.  Dental exams once or twice a year.  Routine eye exams. Ask your health care provider how often you should have your eyes checked.  Personal lifestyle choices, including:  Daily care of your teeth and gums.  Regular physical activity.  Eating a healthy diet.  Avoiding tobacco and drug use.  Limiting alcohol use.  Practicing safe sex.  Taking low doses of aspirin every day.  Taking vitamin and mineral supplements as recommended by your health care provider. What happens during an annual well check? The services and screenings done by your health care provider during your annual well check will depend on your age,  overall health, lifestyle risk factors, and family history of disease. Counseling  Your health care provider may ask you questions about your:  Alcohol use.  Tobacco use.  Drug use.  Emotional well-being.  Home and relationship well-being.  Sexual activity.  Eating habits.  History of falls.  Memory and ability to understand (cognition).  Work and work Statistician. Screening  You may have the following tests or measurements:  Height, weight, and BMI.  Blood pressure.  Lipid and cholesterol levels. These may be checked every 5 years, or more frequently if you are over 27 years old.  Skin check.  Lung cancer screening. You may have this screening every year starting at age 52 if you have a 30-pack-year history of smoking and currently smoke or have quit within the past 15 years.  Fecal occult blood test (FOBT) of the stool. You may have this test every year starting at age 62.  Flexible sigmoidoscopy or colonoscopy. You may have a sigmoidoscopy every 5 years or a colonoscopy every 10 years starting at age 33.  Prostate cancer screening. Recommendations will vary depending on your family history and other risks.  Hepatitis C blood test.  Hepatitis B blood test.  Sexually transmitted disease (STD) testing.  Diabetes screening. This is done by checking your blood sugar (glucose) after you have not eaten for a while (fasting). You may have this done every 1-3 years.  Abdominal aortic aneurysm (AAA) screening. You may need this if you are a current or former smoker.  Osteoporosis. You may be screened starting at age 21 if you are at high risk. Talk with your health care provider about your test results,  treatment options, and if necessary, the need for more tests. Vaccines  Your health care provider may recommend certain vaccines, such as:  Influenza vaccine. This is recommended every year.  Tetanus, diphtheria, and acellular pertussis (Tdap, Td) vaccine. You may  need a Td booster every 10 years.  Zoster vaccine. You may need this after age 21.  Pneumococcal 13-valent conjugate (PCV13) vaccine. One dose is recommended after age 49.  Pneumococcal polysaccharide (PPSV23) vaccine. One dose is recommended after age 30. Talk to your health care provider about which screenings and vaccines you need and how often you need them. This information is not intended to replace advice given to you by your health care provider. Make sure you discuss any questions you have with your health care provider. Document Released: 11/06/2015 Document Revised: 06/29/2016 Document Reviewed: 08/11/2015 Elsevier Interactive Patient Education  2017 [place] Prevention in the Home Falls can cause injuries. They can happen to people of all ages. There are many things you can do to make your home safe and to help prevent falls. What can I do on the outside of my home?  Regularly fix the edges of walkways and driveways and fix any cracks.  Remove anything that might make you trip as you walk through a door, such as a raised step or threshold.  Trim any bushes or trees on the path to your home.  Use bright outdoor lighting.  Clear any walking paths of anything that might make someone trip, such as rocks or tools.  Regularly check to see if handrails are loose or broken. Make sure that both sides of any steps have handrails.  Any raised decks and porches should have guardrails on the edges.  Have any leaves, snow, or ice cleared regularly.  Use sand or salt on walking paths during winter.  Clean up any spills in your garage right away. This includes oil or grease spills. What can I do in the bathroom?  Use night lights.  Install grab bars by the toilet and in the tub and shower. Do not use towel bars as grab bars.  Use non-skid mats or decals in the tub or shower.  If you need to sit down in the shower, use a plastic, non-slip stool.  Keep the floor  dry. Clean up any water that spills on the floor as soon as it happens.  Remove soap buildup in the tub or shower regularly.  Attach bath mats securely with double-sided non-slip rug tape.  Do not have throw rugs and other things on the floor that can make you trip. What can I do in the bedroom?  Use night lights.  Make sure that you have a light by your bed that is easy to reach.  Do not use any sheets or blankets that are too big for your bed. They should not hang down onto the floor.  Have a firm chair that has side arms. You can use this for support while you get dressed.  Do not have throw rugs and other things on the floor that can make you trip. What can I do in the kitchen?  Clean up any spills right away.  Avoid walking on wet floors.  Keep items that you use a lot in easy-to-reach places.  If you need to reach something above you, use a strong step stool that has a grab bar.  Keep electrical cords out of the way.  Do not use floor polish or wax that makes floors  slippery. If you must use wax, use non-skid floor wax.  Do not have throw rugs and other things on the floor that can make you trip. What can I do with my stairs?  Do not leave any items on the stairs.  Make sure that there are handrails on both sides of the stairs and use them. Fix handrails that are broken or loose. Make sure that handrails are as long as the stairways.  Check any carpeting to make sure that it is firmly attached to the stairs. Fix any carpet that is loose or worn.  Avoid having throw rugs at the top or bottom of the stairs. If you do have throw rugs, attach them to the floor with carpet tape.  Make sure that you have a light switch at the top of the stairs and the bottom of the stairs. If you do not have them, ask someone to add them for you. What else can I do to help prevent falls?  Wear shoes that:  Do not have high heels.  Have rubber bottoms.  Are comfortable and fit you  well.  Are closed at the toe. Do not wear sandals.  If you use a stepladder:  Make sure that it is fully opened. Do not climb a closed stepladder.  Make sure that both sides of the stepladder are locked into place.  Ask someone to hold it for you, if possible.  Clearly mark and make sure that you can see:  Any grab bars or handrails.  First and last steps.  Where the edge of each step is.  Use tools that help you move around (mobility aids) if they are needed. These include:  Canes.  Walkers.  Scooters.  Crutches.  Turn on the lights when you go into a dark area. Replace any light bulbs as soon as they burn out.  Set up your furniture so you have a clear path. Avoid moving your furniture around.  If any of your floors are uneven, fix them.  If there are any pets around you, be aware of where they are.  Review your medicines with your doctor. Some medicines can make you feel dizzy. This can increase your chance of falling. Ask your doctor what other things that you can do to help prevent falls. This information is not intended to replace advice given to you by your health care provider. Make sure you discuss any questions you have with your health care provider. Document Released: 08/06/2009 Document Revised: 03/17/2016 Document Reviewed: 11/14/2014 Elsevier Interactive Patient Education  2017 [employer].

## 2020-04-21 NOTE — Patient Instructions (Signed)
.   Please review the attached list of medications and notify my office if there are any errors.   . Please bring all of your medications to every appointment so we can make sure that our medication list is the same as yours.   

## 2020-04-22 ENCOUNTER — Telehealth: Payer: Self-pay

## 2020-04-22 NOTE — Telephone Encounter (Signed)
Lillian sent referral back and noted that pt has an appt scheduled with them on 06/09/2020 @ 9:45 am with Dr. Neville Route. Thanks TNP

## 2020-04-24 ENCOUNTER — Telehealth: Payer: Self-pay

## 2020-04-24 ENCOUNTER — Other Ambulatory Visit: Payer: Self-pay

## 2020-04-24 ENCOUNTER — Ambulatory Visit
Admission: RE | Admit: 2020-04-24 | Discharge: 2020-04-24 | Disposition: A | Discharge status: Home / Self Care | Payer: Medicare PPO | Source: Ambulatory Visit | Attending: Family Medicine | Admitting: Family Medicine

## 2020-04-24 DIAGNOSIS — I719 Aortic aneurysm of unspecified site, without rupture: Secondary | ICD-10-CM | POA: Diagnosis not present

## 2020-04-24 NOTE — Telephone Encounter (Signed)
Attempted to contact patient, no answer left a voicemail. Okay for [facility] to advise patient.  

## 2020-04-24 NOTE — Telephone Encounter (Signed)
Pt. Given results, verbalizes understanding. 

## 2020-04-24 NOTE — Telephone Encounter (Signed)
-----   Message from Birdie Sons, MD sent at 04/24/2020  1:57 PM EDT ----- Aneurysm has enlarged slightly, but still small. Need to repeat ultrasound in July 2023

## 2020-04-27 ENCOUNTER — Other Ambulatory Visit: Payer: Self-pay | Admitting: Family Medicine

## 2020-04-27 DIAGNOSIS — E785 Hyperlipidemia, unspecified: Secondary | ICD-10-CM

## 2020-05-01 ENCOUNTER — Encounter: Payer: Medicare PPO | Admitting: Family Medicine

## 2020-05-15 DIAGNOSIS — R202 Paresthesia of skin: Secondary | ICD-10-CM | POA: Diagnosis not present

## 2020-05-15 DIAGNOSIS — R531 Weakness: Secondary | ICD-10-CM | POA: Diagnosis not present

## 2020-05-15 DIAGNOSIS — R200 Anesthesia of skin: Secondary | ICD-10-CM | POA: Diagnosis not present

## 2020-05-15 DIAGNOSIS — R253 Fasciculation: Secondary | ICD-10-CM | POA: Diagnosis not present

## 2020-05-15 DIAGNOSIS — M6259 Muscle wasting and atrophy, not elsewhere classified, multiple sites: Secondary | ICD-10-CM | POA: Diagnosis not present

## 2020-05-15 DIAGNOSIS — Z114 Encounter for screening for human immunodeficiency virus [HIV]: Secondary | ICD-10-CM | POA: Diagnosis not present

## 2020-05-15 LAB — VITAMIN D 25 HYDROXY (VIT D DEFICIENCY, FRACTURES): Vit D, 25-Hydroxy: 23.6

## 2020-05-15 LAB — VITAMIN B12: Vitamin B-12: 293

## 2020-05-18 ENCOUNTER — Other Ambulatory Visit: Payer: Self-pay | Admitting: Neurology

## 2020-05-18 DIAGNOSIS — M6259 Muscle wasting and atrophy, not elsewhere classified, multiple sites: Secondary | ICD-10-CM

## 2020-05-18 DIAGNOSIS — M545 Low back pain: Secondary | ICD-10-CM | POA: Diagnosis not present

## 2020-05-18 DIAGNOSIS — R253 Fasciculation: Secondary | ICD-10-CM

## 2020-05-18 DIAGNOSIS — R531 Weakness: Secondary | ICD-10-CM

## 2020-06-03 ENCOUNTER — Other Ambulatory Visit: Payer: Self-pay

## 2020-06-03 ENCOUNTER — Ambulatory Visit
Admission: RE | Admit: 2020-06-03 | Discharge: 2020-06-03 | Disposition: A | Discharge status: Home / Self Care | Payer: Medicare PPO | Source: Ambulatory Visit | Attending: Neurology | Admitting: Neurology

## 2020-06-03 DIAGNOSIS — M50221 Other cervical disc displacement at C4-C5 level: Secondary | ICD-10-CM | POA: Diagnosis not present

## 2020-06-03 DIAGNOSIS — M5021 Other cervical disc displacement,  high cervical region: Secondary | ICD-10-CM | POA: Diagnosis not present

## 2020-06-03 DIAGNOSIS — M2578 Osteophyte, vertebrae: Secondary | ICD-10-CM | POA: Diagnosis not present

## 2020-06-03 DIAGNOSIS — R253 Fasciculation: Secondary | ICD-10-CM | POA: Diagnosis not present

## 2020-06-03 DIAGNOSIS — R531 Weakness: Secondary | ICD-10-CM | POA: Insufficient documentation

## 2020-06-03 DIAGNOSIS — M4802 Spinal stenosis, cervical region: Secondary | ICD-10-CM | POA: Diagnosis not present

## 2020-06-03 DIAGNOSIS — M6259 Muscle wasting and atrophy, not elsewhere classified, multiple sites: Secondary | ICD-10-CM | POA: Insufficient documentation

## 2020-06-05 ENCOUNTER — Other Ambulatory Visit: Payer: Self-pay

## 2020-06-05 ENCOUNTER — Ambulatory Visit: Payer: Medicare PPO | Admitting: Physician Assistant

## 2020-06-05 ENCOUNTER — Encounter: Payer: Self-pay | Admitting: Physician Assistant

## 2020-06-05 VITALS — BP 112/72 | HR 70 | Temp 98.2°F | Wt 198.8 lb

## 2020-06-05 DIAGNOSIS — M6259 Muscle wasting and atrophy, not elsewhere classified, multiple sites: Secondary | ICD-10-CM | POA: Diagnosis not present

## 2020-06-05 DIAGNOSIS — H6121 Impacted cerumen, right ear: Secondary | ICD-10-CM

## 2020-06-05 DIAGNOSIS — R253 Fasciculation: Secondary | ICD-10-CM | POA: Diagnosis not present

## 2020-06-05 DIAGNOSIS — R531 Weakness: Secondary | ICD-10-CM | POA: Diagnosis not present

## 2020-06-05 NOTE — Progress Notes (Signed)
Established patient visit   Patient: [place]   DOB: 1951-02-16   69 y.o. Male  MRN: [id number] Visit Date: 06/05/2020  Today's healthcare provider: Trinna Post, PA-C   Chief Complaint  Patient presents with  . Ear Fullness   Subjective    HPI  Patient presents today with bilateral ear fullness. He feels that it could be impacted with wax. He has decreased hearing in his right ear. Denies dizziness or feeling off balance. He has had symptoms for 3 days.      Medications: Outpatient Medications Prior to Visit  Medication Sig  . aspirin (ASPIR-LOW) 81 MG EC tablet Take 81 mg by mouth daily.   Marland Kitchen ezetimibe (ZETIA) 10 MG tablet TAKE 1 TABLET BY MOUTH EVERY DAY  . vitamin B-12 (CYANOCOBALAMIN) 100 MCG tablet Take 100 mcg by mouth daily.  Marland Kitchen gabapentin (NEURONTIN) 300 MG capsule Take 1 capsule (300 mg total) by mouth at bedtime. For sciatic nerve pain (Patient not taking: Reported on 04/21/2020)  . glucose blood (ONETOUCH VERIO) test strip TEST ONCE DAILY  Dx: E11.9  . hydrochlorothiazide (HYDRODIURIL) 50 MG tablet TAKE 1 TABLET BY MOUTH EVERY DAY  . lovastatin (MEVACOR) 40 MG tablet TAKE 1 TABLET BY MOUTH EVERY DAY  . omeprazole (PRILOSEC) 40 MG capsule TK 1 C PO QD  . OneTouch Delica Lancets 67Y MISC Use to check blood sugar once a day.  DX: E11.9  . traMADol (ULTRAM) 50 MG tablet Take 50 mg by mouth every 6 (six) hours as needed.  (Patient not taking: Reported on 06/05/2020)   No facility-administered medications prior to visit.    Review of Systems  Constitutional: Negative.   HENT: Negative for ear discharge and ear pain.       Objective    BP 112/72   Pulse 70   Temp 98.2 F (36.8 C)   Wt 198 lb 12.8 oz (90.2 kg)   BMI 27.73 kg/m    Physical Exam Constitutional:      Appearance: Normal appearance.  HENT:     Right Ear: Tympanic membrane and ear canal normal. There is impacted cerumen.     Left Ear: Tympanic membrane and ear canal normal. There is  no impacted cerumen.  Cardiovascular:     Rate and Rhythm: Regular rhythm.  Pulmonary:     Effort: Pulmonary effort is normal.  Skin:    General: Skin is warm and dry.  Neurological:     Mental Status: He is alert and oriented to person, place, and time. Mental status is at baseline.  Psychiatric:        Mood and Affect: Mood normal.        Behavior: Behavior normal.      No results found for any visits on 06/05/20.  Assessment & Plan    1. Impacted cerumen of right ear  Large amount of wax removed with both ear lavage and cerumen spoon. Recommend debrox drops on regular basis to keep wax soft.    No follow-ups on file.      ITrinna Post, PA-C, have reviewed all documentation for this visit. The documentation on 06/09/20 for the exam, diagnosis, procedures, and orders are all accurate and complete.  The entirety of the information documented in the History of Present Illness, Review of Systems and Physical Exam were personally obtained by me. Portions of this information were initially documented by Wilburt Finlay, CMA and reviewed by me for thoroughness and accuracy.  I spent 20 minutes dedicated to the care of this patient on the date of this encounter to include pre-visit review of records, face-to-face time with the patient discussing and performing earwax removal, and post visit ordering of testing.      Paulene Floor  [facility] [phone] (phone) [phone] (fax)  [place]

## 2020-06-05 NOTE — Patient Instructions (Signed)
Debrox Ear Drops 10 drops every 2 weeks [facility] [address]

## 2020-06-09 DIAGNOSIS — M6259 Muscle wasting and atrophy, not elsewhere classified, multiple sites: Secondary | ICD-10-CM | POA: Diagnosis not present

## 2020-06-09 DIAGNOSIS — R202 Paresthesia of skin: Secondary | ICD-10-CM | POA: Diagnosis not present

## 2020-06-09 DIAGNOSIS — R200 Anesthesia of skin: Secondary | ICD-10-CM | POA: Diagnosis not present

## 2020-06-09 DIAGNOSIS — R531 Weakness: Secondary | ICD-10-CM | POA: Diagnosis not present

## 2020-06-09 DIAGNOSIS — R253 Fasciculation: Secondary | ICD-10-CM | POA: Diagnosis not present

## 2020-06-16 DIAGNOSIS — E119 Type 2 diabetes mellitus without complications: Secondary | ICD-10-CM | POA: Diagnosis not present

## 2020-06-16 LAB — HM DIABETES EYE EXAM

## 2020-08-24 DIAGNOSIS — M5416 Radiculopathy, lumbar region: Secondary | ICD-10-CM | POA: Diagnosis not present

## 2020-08-27 ENCOUNTER — Other Ambulatory Visit: Payer: Self-pay | Admitting: Family Medicine

## 2020-08-27 NOTE — Telephone Encounter (Signed)
Medication Refill - Medication: omeprazole (PRILOSEC) 40 MG capsule   Has the patient contacted their pharmacy? yes (Agent: If no, request that the patient contact the pharmacy for the refill.) (Agent: If yes, when and what did the pharmacy advise?)Contact PCP  Preferred Pharmacy (with phone number or street name):  [id number] - [place], [place] - [address] Phone:  [phone]  Fax:  [phone]       Agent: Please be advised that RX refills may take up to 3 business days. We ask that you follow-up with your pharmacy.

## 2020-08-27 NOTE — Telephone Encounter (Signed)
Please advise on refill request. This medication is listed as historical.

## 2020-08-27 NOTE — Telephone Encounter (Signed)
Requested medication (s) are due for refill today: Yes  Requested medication (s) are on the active medication list: Yes  Last refill:  08/13/19  Future visit scheduled: Yes  Notes to clinic:  Prescription expired, historical provider.    Requested Prescriptions  Pending Prescriptions Disp Refills   omeprazole (PRILOSEC) 40 MG capsule      Sig: Take 1 capsule (40 mg total) by mouth daily.      Gastroenterology: Proton Pump Inhibitors Passed - 08/27/2020  1:45 PM      Passed - Valid encounter within last 12 months    Recent Outpatient Visits           2 months ago Impacted cerumen of right ear   [facility] Carles Collet M, [place]   4 months ago Annual physical exam   [facility] Birdie Sons, MD   4 months ago Leg cramping   [facility] Birdie Sons, MD   8 months ago Type 2 diabetes mellitus with other circulatory complication, without long-term current use of insulin [facility]   [facility] Birdie Sons, MD   1 year ago Bilateral sciatica   [facility] Birdie Sons, MD       Future Appointments             In 3 weeks Caryn Section, Kirstie Peri, MD [facility], [place]   In 8 months Fisher, Kirstie Peri, MD [facility], [place]

## 2020-08-28 MED ORDER — OMEPRAZOLE 40 MG PO CPDR: 40.0000 mg | DELAYED_RELEASE_CAPSULE | Freq: Every day | ORAL | 1 refills | Status: DC

## 2020-09-14 DIAGNOSIS — H16223 Keratoconjunctivitis sicca, not specified as Sjogren's, bilateral: Secondary | ICD-10-CM | POA: Diagnosis not present

## 2020-09-16 NOTE — Progress Notes (Deleted)
Established patient visit   Patient: [place]   DOB: [birth year]   69 y.o. Male  MRN: [id number] Visit Date: 09/21/2020  Today's healthcare provider: Lelon Huh, MD   No chief complaint on file.  Subjective    HPI  Diabetes Mellitus Type II, Follow-up  Lab Results  Component Value Date   HGBA1C 6.9 (H) 04/10/2020   HGBA1C 7.2 (A) 12/24/2019   HGBA1C 6.9 (H) 04/09/2019   Wt Readings from Last 3 Encounters:  06/05/20 198 lb 12.8 oz (90.2 kg)  04/21/20 196 lb 9.6 oz (89.2 kg)  04/10/20 195 lb (88.5 kg)   Last seen for diabetes 5 months ago.  Management since then includes continue healthy diet. He reports {excellent/good/fair/poor:19665} compliance with treatment. He {is/is not:21021397} having side effects. {document side effects if present:1} Symptoms: {Yes/No:20286} fatigue {Yes/No:20286} foot ulcerations  {Yes/No:20286} appetite changes {Yes/No:20286} nausea  {Yes/No:20286} paresthesia of the feet  {Yes/No:20286} polydipsia  {Yes/No:20286} polyuria {Yes/No:20286} visual disturbances   {Yes/No:20286} vomiting     Home blood sugar records: {diabetes glucometry results:16657}  Episodes of hypoglycemia? {Yes/No:20286} {enter symptoms and frequency of symptoms if yes:1}   Current insulin regiment: none; diet controlled Most Recent Eye Exam: 06/16/2020 {Current exercise:16438:::1} {Current diet habits:16563:::1}  Pertinent Labs: Lab Results  Component Value Date   CHOL 142 04/10/2020   HDL 37 (L) 04/10/2020   LDLCALC 90 04/10/2020   TRIG 79 04/10/2020   CHOLHDL 3.8 04/10/2020   Lab Results  Component Value Date   NA 141 04/10/2020   K 3.6 04/10/2020   CREATININE 0.76 04/10/2020   GFRNONAA 93 04/10/2020   GFRAA 108 04/10/2020   GLUCOSE 186 (H) 04/10/2020     ---------------------------------------------------------------------------------------------------  Hypertension, follow-up  BP Readings from Last 3 Encounters:  06/05/20 112/72   04/21/20 136/82  04/10/20 128/82   Wt Readings from Last 3 Encounters:  06/05/20 198 lb 12.8 oz (90.2 kg)  04/21/20 196 lb 9.6 oz (89.2 kg)  04/10/20 195 lb (88.5 kg)     He was last seen for hypertension 5 months ago.  BP at that visit was 136/82. Management since that visit includes continue same medication.  He reports {excellent/good/fair/poor:19665} compliance with treatment. He {is/is not:9024} having side effects. {document side effects if present:1} He is following a {diet:21022986} diet. He {is/is not:9024} exercising. He {does/does not:200015} smoke.  Use of agents associated with hypertension: NSAIDS.   Outside blood pressures are {***enter patient reported home BP readings, or 'not being checked':1}. Symptoms: {Yes/No:20286} chest pain {Yes/No:20286} chest pressure  {Yes/No:20286} palpitations {Yes/No:20286} syncope  {Yes/No:20286} dyspnea {Yes/No:20286} orthopnea  {Yes/No:20286} paroxysmal nocturnal dyspnea {Yes/No:20286} lower extremity edema   Pertinent labs: Lab Results  Component Value Date   CHOL 142 04/10/2020   HDL 37 (L) 04/10/2020   LDLCALC 90 04/10/2020   TRIG 79 04/10/2020   CHOLHDL 3.8 04/10/2020   Lab Results  Component Value Date   NA 141 04/10/2020   K 3.6 04/10/2020   CREATININE 0.76 04/10/2020   GFRNONAA 93 04/10/2020   GFRAA 108 04/10/2020   GLUCOSE 186 (H) 04/10/2020     The 10-year ASCVD risk score Mikey Bussing DC Jr., et al., 2013) is: 47.1%   ---------------------------------------------------------------------------------------------------  {Show patient history (optional):23778::" "}   Medications: Outpatient Medications Prior to Visit  Medication Sig  . aspirin (ASPIR-LOW) 81 MG EC tablet Take 81 mg by mouth daily.   Marland Kitchen ezetimibe (ZETIA) 10 MG tablet TAKE 1 TABLET BY MOUTH EVERY DAY  . gabapentin (NEURONTIN) 300  MG capsule Take 1 capsule (300 mg total) by mouth at bedtime. For sciatic nerve pain (Patient not taking: Reported on  04/21/2020)  . glucose blood (ONETOUCH VERIO) test strip TEST ONCE DAILY  Dx: E11.9  . hydrochlorothiazide (HYDRODIURIL) 50 MG tablet TAKE 1 TABLET BY MOUTH EVERY DAY  . lovastatin (MEVACOR) 40 MG tablet TAKE 1 TABLET BY MOUTH EVERY DAY  . omeprazole (PRILOSEC) 40 MG capsule Take 1 capsule (40 mg total) by mouth daily.  Glory Rosebush Delica Lancets 03J MISC Use to check blood sugar once a day.  DX: E11.9  . traMADol (ULTRAM) 50 MG tablet Take 50 mg by mouth every 6 (six) hours as needed.  (Patient not taking: Reported on 06/05/2020)  . vitamin B-12 (CYANOCOBALAMIN) 100 MCG tablet Take 100 mcg by mouth daily.   No facility-administered medications prior to visit.    Review of Systems  {Heme  Chem  Endocrine  Serology  Results Review (optional):23779::" "}  Objective    There were no vitals taken for this visit. {Show previous vital signs (optional):23777::" "}  Physical Exam  ***  No results found for any visits on 09/21/20.  Assessment & Plan     ***  No follow-ups on file.      {provider attestation***:1}   Lelon Huh, MD  [facility] [phone] (phone) [phone] (fax)  [place]

## 2020-09-21 ENCOUNTER — Telehealth (INDEPENDENT_AMBULATORY_CARE_PROVIDER_SITE_OTHER): Payer: Medicare PPO | Admitting: Family Medicine

## 2020-09-21 ENCOUNTER — Encounter: Payer: Self-pay | Admitting: Family Medicine

## 2020-09-21 ENCOUNTER — Ambulatory Visit: Payer: Self-pay | Admitting: Family Medicine

## 2020-09-21 VITALS — Temp 98.7°F

## 2020-09-21 DIAGNOSIS — E1159 Type 2 diabetes mellitus with other circulatory complications: Secondary | ICD-10-CM

## 2020-09-21 DIAGNOSIS — R059 Cough, unspecified: Secondary | ICD-10-CM

## 2020-09-21 DIAGNOSIS — J069 Acute upper respiratory infection, unspecified: Secondary | ICD-10-CM

## 2020-09-21 MED ORDER — AMOXICILLIN 500 MG PO CAPS: 1000.0000 mg | ORAL_CAPSULE | Freq: Three times a day (TID) | ORAL | 0 refills | Status: AC

## 2020-09-21 NOTE — Progress Notes (Signed)
MyChart Video Visit    Virtual Visit via Video Note   This visit type was conducted due to national recommendations for restrictions regarding the COVID-19 Pandemic (e.g. social distancing) in an effort to limit this patient's exposure and mitigate transmission in our community. This patient is at least at moderate risk for complications without adequate follow up. This format is felt to be most appropriate for this patient at this time. Physical exam was limited by quality of the video and audio technology used for the visit.   Patient location: home  Provider location: [facility]  I discussed the limitations of evaluation and management by telemedicine and the availability of in person appointments. The patient expressed understanding and agreed to proceed.  Patient: [place]   DOB: November 09, 1950   69 y.o. Male  MRN: [id number] Visit Date: 09/21/2020  Today's healthcare provider: Lelon Huh, MD   Chief Complaint  Patient presents with  . Sinus Problem   Subjective    Sinus Problem This is a new problem. Episode onset: 3 days ago. The problem has been gradually worsening since onset. There has been no fever. Associated symptoms include congestion, coughing (dry), a hoarse voice, sinus pressure and a sore throat. Pertinent negatives include no chills, diaphoresis, ear pain, headaches or shortness of breath. Treatments tried: Alka Seltzer, Zycam, OTC cold and sinus medications. The treatment provided mild relief.  Mostly runny nose and stuffy nose. Has history of recurrent bronchitis. No known Covid exposures.     Medications: Outpatient Medications Prior to Visit  Medication Sig  . aspirin (ASPIR-LOW) 81 MG EC tablet Take 81 mg by mouth daily.   Marland Kitchen ezetimibe (ZETIA) 10 MG tablet TAKE 1 TABLET BY MOUTH EVERY DAY  . gabapentin (NEURONTIN) 300 MG capsule Take 1 capsule (300 mg total) by mouth at bedtime. For sciatic nerve pain  . glucose blood (ONETOUCH VERIO) test strip TEST ONCE  DAILY  Dx: E11.9  . hydrochlorothiazide (HYDRODIURIL) 50 MG tablet TAKE 1 TABLET BY MOUTH EVERY DAY  . lovastatin (MEVACOR) 40 MG tablet TAKE 1 TABLET BY MOUTH EVERY DAY  . omeprazole (PRILOSEC) 40 MG capsule Take 1 capsule (40 mg total) by mouth daily.  Glory Rosebush Delica Lancets 01U MISC Use to check blood sugar once a day.  DX: E11.9  . traMADol (ULTRAM) 50 MG tablet Take 50 mg by mouth every 6 (six) hours as needed.   . vitamin B-12 (CYANOCOBALAMIN) 100 MCG tablet Take 100 mcg by mouth daily.   No facility-administered medications prior to visit.    Review of Systems  Constitutional: Positive for fatigue. Negative for appetite change, chills, diaphoresis and fever.  HENT: Positive for congestion, hoarse voice, postnasal drip, rhinorrhea, sinus pressure, sinus pain and sore throat. Negative for ear pain.   Eyes: Positive for discharge. Negative for itching.  Respiratory: Positive for cough (dry). Negative for chest tightness, shortness of breath and wheezing.   Cardiovascular: Negative for chest pain and palpitations.  Gastrointestinal: Negative for abdominal pain, nausea and vomiting.  Neurological: Negative for headaches.      Objective    Temp 98.7 F (37.1 C) (Oral)    Physical Exam   Awake, alert, oriented x 3. In no apparent distress   Assessment & Plan     1. Upper respiratory tract infection, unspecified type He is prone to bronchitis infections and would like an antibiotic in case it does not resolve on its own.  - amoxicillin (AMOXIL) 500 MG capsule; Take 2 capsules (  1,000 mg total) by mouth 3 (three) times daily for 7 days.  Dispense: 42 capsule; Refill: 0   Will come in this afternoon for Covid testing.       I discussed the assessment and treatment plan with the patient. The patient was provided an opportunity to ask questions and all were answered. The patient agreed with the plan and demonstrated an understanding of the instructions.   The patient was  advised to call back or seek an in-person evaluation if the symptoms worsen or if the condition fails to improve as anticipated.  I provided 10 minutes of non-face-to-face time during this encounter.  The entirety of the information documented in the History of Present Illness, Review of Systems and Physical Exam were personally obtained by me. Portions of this information were initially documented by the CMA and reviewed by me for thoroughness and accuracy.     Lelon Huh, MD [facility] [phone] (phone) [phone] (fax)  [place]

## 2020-09-23 LAB — NOVEL CORONAVIRUS, NAA: SARS-CoV-2, NAA: NOT DETECTED

## 2020-09-23 LAB — SARS-COV-2, NAA 2 DAY TAT

## 2020-09-24 ENCOUNTER — Other Ambulatory Visit: Payer: Self-pay | Admitting: Family Medicine

## 2020-10-05 ENCOUNTER — Ambulatory Visit: Payer: Medicare PPO | Admitting: Family Medicine

## 2020-10-05 ENCOUNTER — Other Ambulatory Visit: Payer: Self-pay

## 2020-10-05 VITALS — BP 141/83 | HR 78 | Temp 98.4°F | Resp 16 | Ht 70.5 in | Wt 204.0 lb

## 2020-10-05 DIAGNOSIS — E538 Deficiency of other specified B group vitamins: Secondary | ICD-10-CM | POA: Insufficient documentation

## 2020-10-05 DIAGNOSIS — E559 Vitamin D deficiency, unspecified: Secondary | ICD-10-CM | POA: Diagnosis not present

## 2020-10-05 DIAGNOSIS — E1159 Type 2 diabetes mellitus with other circulatory complications: Secondary | ICD-10-CM

## 2020-10-05 DIAGNOSIS — Z23 Encounter for immunization: Secondary | ICD-10-CM

## 2020-10-05 DIAGNOSIS — I10 Essential (primary) hypertension: Secondary | ICD-10-CM | POA: Diagnosis not present

## 2020-10-05 LAB — POCT GLYCOSYLATED HEMOGLOBIN (HGB A1C)
Est. average glucose Bld gHb Est-mCnc: 160
Hemoglobin A1C: 7.2 % — AB (ref 4.0–5.6)

## 2020-10-05 NOTE — Progress Notes (Signed)
Established patient visit   Patient: Thomas Morgan   DOB: 08/31/1951   69 y.o. Male  MRN: [id number] Visit Date: 10/05/2020  Today's healthcare provider: Lelon Huh, MD   Chief Complaint  Patient presents with  . Hypertension  . Diabetes   Subjective    HPI  Hypertension, follow-up  BP Readings from Last 3 Encounters:  10/05/20 (!) 141/83  06/05/20 112/72  04/21/20 136/82   Wt Readings from Last 3 Encounters:  10/05/20 204 lb (92.5 kg)  06/05/20 198 lb 12.8 oz (90.2 kg)  04/21/20 196 lb 9.6 oz (89.2 kg)     He was last seen for hypertension 6 months ago.  BP at that visit was 136/82. Management since that visit includes no changes.  He reports good compliance with treatment. He is not having side effects.  He is following a Regular diet. He is exercising. He does not smoke.  Use of agents associated with hypertension: none.   Outside blood pressures are checked occasionally. Symptoms: No chest pain No chest pressure  No palpitations No syncope  No dyspnea No orthopnea  No paroxysmal nocturnal dyspnea No lower extremity edema   Pertinent labs: Lab Results  Component Value Date   CHOL 142 04/10/2020   HDL 37 (L) 04/10/2020   LDLCALC 90 04/10/2020   TRIG 79 04/10/2020   CHOLHDL 3.8 04/10/2020   Lab Results  Component Value Date   NA 141 04/10/2020   K 3.6 04/10/2020   CREATININE 0.76 04/10/2020   GFRNONAA 93 04/10/2020   GFRAA 108 04/10/2020   GLUCOSE 186 (H) 04/10/2020     The 10-year ASCVD risk score Mikey Bussing DC Jr., et al., 2013) is: 47.1%    Diabetes Mellitus Type II, Follow-up  Lab Results  Component Value Date   HGBA1C 7.2 (A) 10/05/2020   HGBA1C 6.9 (H) 04/10/2020   HGBA1C 7.2 (A) 12/24/2019   Wt Readings from Last 3 Encounters:  10/05/20 204 lb (92.5 kg)  06/05/20 198 lb 12.8 oz (90.2 kg)  04/21/20 196 lb 9.6 oz (89.2 kg)   Last seen for diabetes 6 months ago.  Management since then includes no changes. He reports good  compliance with treatment. He is not having side effects.  Symptoms: No fatigue No foot ulcerations  No appetite changes No nausea  No paresthesia of the feet  No polydipsia  No polyuria No visual disturbances   No vomiting     Home blood sugar records: trend: stable per pt.   Episodes of hypoglycemia? No    Current insulin regiment: none Most Recent Eye Exam: 05/2020 Current exercise: no regular exercise Current diet habits: well balanced  Pertinent Labs: Lab Results  Component Value Date   CHOL 142 04/10/2020   HDL 37 (L) 04/10/2020   LDLCALC 90 04/10/2020   TRIG 79 04/10/2020   CHOLHDL 3.8 04/10/2020   Lab Results  Component Value Date   NA 141 04/10/2020   K 3.6 04/10/2020   CREATININE 0.76 04/10/2020   GFRNONAA 93 04/10/2020   GFRAA 108 04/10/2020   GLUCOSE 186 (H) 04/10/2020        Medications: Outpatient Medications Prior to Visit  Medication Sig  . aspirin 81 MG EC tablet Take 81 mg by mouth daily.   Marland Kitchen ezetimibe (ZETIA) 10 MG tablet TAKE 1 TABLET BY MOUTH EVERY DAY  . glucose blood (ONETOUCH VERIO) test strip TEST ONCE DAILY  Dx: E11.9  . hydrochlorothiazide (HYDRODIURIL) 50 MG tablet TAKE 1 TABLET  BY MOUTH EVERY DAY  . lovastatin (MEVACOR) 40 MG tablet TAKE 1 TABLET BY MOUTH EVERY DAY  . omeprazole (PRILOSEC) 40 MG capsule Take 1 capsule (40 mg total) by mouth daily.  Glory Rosebush Delica Lancets 94W MISC Use to check blood sugar once a day.  DX: E11.9  . traMADol (ULTRAM) 50 MG tablet Take 50 mg by mouth every 6 (six) hours as needed.   . vitamin B-12 (CYANOCOBALAMIN) 100 MCG tablet Take 100 mcg by mouth daily.  . [DISCONTINUED] gabapentin (NEURONTIN) 300 MG capsule Take 1 capsule (300 mg total) by mouth at bedtime. For sciatic nerve pain (Patient not taking: Reported on 10/05/2020)   No facility-administered medications prior to visit.    Review of Systems  Constitutional: Negative.   Respiratory: Negative.   Cardiovascular: Negative.   Endocrine:  Negative for cold intolerance, heat intolerance, polydipsia, polyphagia and polyuria.  Neurological: Negative.       Objective    BP (!) 141/83   Pulse 78   Temp 98.4 F (36.9 C)   Resp 16   Ht 5' 10.5" (1.791 m)   Wt 204 lb (92.5 kg)   BMI 28.86 kg/m    Physical Exam   General: Appearance:     Overweight male in no acute distress  Eyes:    PERRL, conjunctiva/corneas clear, EOM's intact       Lungs:     Clear to auscultation bilaterally, respirations unlabored  Heart:    Normal heart rate. Normal rhythm. No murmurs, rubs, or gallops.   MS:   All extremities are intact.   Neurologic:   Awake, alert, oriented x 3. No apparent focal neurological           defect.         Results for orders placed or performed in visit on 10/05/20  POCT glycosylated hemoglobin (Hb A1C)  Result Value Ref Range   Hemoglobin A1C 7.2 (A) 4.0 - 5.6 %   HbA1c POC (<> result, manual entry)     HbA1c, POC (prediabetic range)     HbA1c, POC (controlled diabetic range)     Est. average glucose Bld gHb Est-mCnc 160     Assessment & Plan     1. Type 2 diabetes mellitus with other circulatory complication, without long-term current use of insulin (HCC) a1c up somewhat today, but has not been as strict with diet and exercise lately. He is going to get back on diet and will recheck a1c in about 3 months.   2. Essential hypertension BP up today. Is to work on diet and exercise as above.   3. Vitamin D deficiency No on vitamin d supplement per Dr. Manuella Ghazi.   4. B12 deficiency No on b12 supplements per Dr. Manuella Ghazi  5. Need for influenza vaccination  - Flu Vaccine QUAD High Dose(Fluad)    Future Appointments  Date Time Provider [place]  04/28/2021  9:20 AM [facility]-NURSE HEALTH ADVISOR [facility]  04/28/2021 10:00 AM Birdie Sons, MD [facility]         The entirety of the information documented in the History of Present Illness, Review of Systems and Physical Exam were personally obtained  by me. Portions of this information were initially documented by the CMA and reviewed by me for thoroughness and accuracy.      Lelon Huh, MD  [facility] [phone] (phone) [phone] (fax)  [place]

## 2020-10-05 NOTE — Patient Instructions (Addendum)
The CDC recommends two doses of Shingrix (the shingles vaccine) separated by 2 to 6 months for adults age 70 years and older. I recommend checking with your pharmacy plan regarding coverage for this vaccine.     You are due for a Td (tetanus-diptheria-vaccine) which protects you from tetanus and diptheria. Please check with your insurance plan or pharmacy regarding coverage for this vaccine.    Work on improving your diet and exercising average of 30 minutes a day to get your below 180 over the next 3-4 months.    Please stop smoking   I do recommend Covid Booster shots since immunity to the initial Covid vaccines wanes within 6 months.

## 2020-11-05 ENCOUNTER — Other Ambulatory Visit: Payer: Self-pay | Admitting: Family Medicine

## 2020-11-05 DIAGNOSIS — E785 Hyperlipidemia, unspecified: Secondary | ICD-10-CM

## 2020-11-12 ENCOUNTER — Other Ambulatory Visit: Payer: Self-pay | Admitting: *Deleted

## 2020-11-12 DIAGNOSIS — Z87891 Personal history of nicotine dependence: Secondary | ICD-10-CM

## 2020-11-12 DIAGNOSIS — Z122 Encounter for screening for malignant neoplasm of respiratory organs: Secondary | ICD-10-CM

## 2020-11-12 NOTE — Progress Notes (Signed)
Contacted and scheduled for annual lung screening scan. Patient is a current smoker with a 50 pack year history. 

## 2020-11-20 ENCOUNTER — Other Ambulatory Visit: Payer: Self-pay

## 2020-11-20 ENCOUNTER — Ambulatory Visit
Admission: RE | Admit: 2020-11-20 | Discharge: 2020-11-20 | Disposition: A | Discharge status: Home / Self Care | Payer: Medicare PPO | Source: Ambulatory Visit | Attending: Oncology | Admitting: Oncology

## 2020-11-20 DIAGNOSIS — Z122 Encounter for screening for malignant neoplasm of respiratory organs: Secondary | ICD-10-CM | POA: Insufficient documentation

## 2020-11-20 DIAGNOSIS — Z87891 Personal history of nicotine dependence: Secondary | ICD-10-CM | POA: Diagnosis not present

## 2020-11-20 DIAGNOSIS — F17210 Nicotine dependence, cigarettes, uncomplicated: Secondary | ICD-10-CM | POA: Diagnosis not present

## 2020-11-23 ENCOUNTER — Telehealth: Payer: Self-pay | Admitting: *Deleted

## 2020-11-23 ENCOUNTER — Encounter: Payer: Self-pay | Admitting: Family Medicine

## 2020-11-23 DIAGNOSIS — J439 Emphysema, unspecified: Secondary | ICD-10-CM | POA: Insufficient documentation

## 2020-11-23 NOTE — Telephone Encounter (Signed)
Notified patient of LDCT lung cancer screening program results with recommendation for 12 month follow up imaging. Also notified of incidental findings noted below and is encouraged to discuss further with PCP who will receive a copy of this note and/or the CT report. Patient verbalizes understanding.   IMPRESSION: 1. Lung-RADS 2, benign appearance or behavior. Continue annual screening with low-dose chest CT without contrast in 12 months. 2. Hepatic steatosis. 3. Aortic Atherosclerosis (ICD10-I70.0) and Emphysema (ICD10-J43.9). Coronary artery atherosclerosis.

## 2020-12-07 DIAGNOSIS — M5416 Radiculopathy, lumbar region: Secondary | ICD-10-CM | POA: Diagnosis not present

## 2020-12-23 ENCOUNTER — Encounter: Payer: Self-pay | Admitting: Family Medicine

## 2020-12-23 ENCOUNTER — Other Ambulatory Visit: Payer: Self-pay

## 2020-12-23 ENCOUNTER — Ambulatory Visit: Payer: Medicare PPO | Admitting: Family Medicine

## 2020-12-23 VITALS — BP 122/78 | HR 76 | Temp 97.3°F | Resp 16 | Wt 199.0 lb

## 2020-12-23 DIAGNOSIS — M79671 Pain in right foot: Secondary | ICD-10-CM | POA: Diagnosis not present

## 2020-12-23 DIAGNOSIS — E1159 Type 2 diabetes mellitus with other circulatory complications: Secondary | ICD-10-CM

## 2020-12-23 DIAGNOSIS — L03031 Cellulitis of right toe: Secondary | ICD-10-CM

## 2020-12-23 DIAGNOSIS — I739 Peripheral vascular disease, unspecified: Secondary | ICD-10-CM

## 2020-12-23 MED ORDER — CEPHALEXIN 500 MG PO CAPS: 500.0000 mg | ORAL_CAPSULE | Freq: Three times a day (TID) | ORAL | 0 refills | Status: AC

## 2020-12-23 NOTE — Patient Instructions (Addendum)
.   Use OTC antifungal cream such as Lamisil or Lotrimin until the skin on your toes has completely healed.

## 2020-12-23 NOTE — Progress Notes (Signed)
Established patient visit   Patient: Thomas Morgan   DOB: Dec 31, 1950   70 y.o. Male  MRN: [id number] Visit Date: 12/23/2020  Today's healthcare provider: Lelon Huh, MD   Chief Complaint  Patient presents with  . Foot Pain   Subjective    HPI  Foot problem: Patient complains of pain and redness of the toes on his right foot. Symptoms started 2-3 weeks ago and has progressively worsened. He states two of his toes are rubbing together which he believes is the cause of his symptoms. He has tried covering the toes with band aids to prevent friction between the toes.  He also reports he gets pain and burning down the back of his legs when he walks which he had contributed to his lumbar spinal stensosis. He states it improves for several weeks after having epidural injections.      Medications: Outpatient Medications Prior to Visit  Medication Sig  . aspirin 81 MG EC tablet Take 81 mg by mouth daily.   . Cholecalciferol 25 MCG (1000 UT) capsule Take 1,000 Units by mouth daily.  Marland Kitchen ezetimibe (ZETIA) 10 MG tablet TAKE 1 TABLET BY MOUTH EVERY DAY  . glucose blood (ONETOUCH VERIO) test strip TEST ONCE DAILY  Dx: E11.9  . hydrochlorothiazide (HYDRODIURIL) 50 MG tablet TAKE 1 TABLET BY MOUTH EVERY DAY  . lovastatin (MEVACOR) 40 MG tablet TAKE 1 TABLET BY MOUTH EVERY DAY  . omeprazole (PRILOSEC) 40 MG capsule Take 1 capsule (40 mg total) by mouth daily.  Glory Rosebush Delica Lancets 84T MISC Use to check blood sugar once a day.  DX: E11.9  . traMADol (ULTRAM) 50 MG tablet Take 50 mg by mouth every 6 (six) hours as needed.   . vitamin B-12 (CYANOCOBALAMIN) 1000 MCG tablet Take 1,000 mcg by mouth daily.   No facility-administered medications prior to visit.    Review of Systems  Constitutional: Negative for appetite change, chills and fever.  Respiratory: Negative for chest tightness, shortness of breath and wheezing.   Cardiovascular: Negative for chest pain and palpitations.   Gastrointestinal: Negative for abdominal pain, nausea and vomiting.  Musculoskeletal: Positive for arthralgias (toe pain of right foot).  Skin: Positive for color change (redness around the toes).       Objective    BP 122/78 (BP Location: Left Arm, Patient Position: Sitting, Cuff Size: Normal)   Pulse 76   Temp (!) 97.3 F (36.3 C) (Temporal)   Resp 16   Wt 199 lb (90.3 kg)   BMI 28.55 kg/m     Physical Exam   Erythema and flaking 4th interdigital space of right foot.  Diabetic foot exam was performed.  Pulses: 1+ bilerally with 4-5 seconds capillary refill Sensation: diminished bilaterally     Assessment & Plan     1. Right foot pain  - Ambulatory referral to Podiatry  2. Cellulitis of toe of right foot  - cephALEXin (KEFLEX) 500 MG capsule; Take 1 capsule (500 mg total) by mouth 3 (three) times daily for 7 days.  Dispense: 21 capsule; Refill: 0 Start using OTC topical antifungals dails  3. Intermittent claudication ([place]) He has attributed to spinal stenosis but weak pulses are concerning for PAD - Ambulatory referral to Vascular Surgery  4. Type 2 diabetes mellitus with other circulatory complication, without long-term current use of insulin ([place])   Follow up as scheduled.      The entirety of the information documented in the History of Present  Illness, Review of Systems and Physical Exam were personally obtained by me. Portions of this information were initially documented by the CMA and reviewed by me for thoroughness and accuracy.      Lelon Huh, MD  [facility] [phone] (phone) [phone] (fax)  [place]

## 2021-01-06 ENCOUNTER — Ambulatory Visit: Payer: Medicare PPO | Admitting: Podiatry

## 2021-01-06 ENCOUNTER — Ambulatory Visit (INDEPENDENT_AMBULATORY_CARE_PROVIDER_SITE_OTHER): Payer: Medicare PPO

## 2021-01-06 ENCOUNTER — Other Ambulatory Visit: Payer: Self-pay

## 2021-01-06 ENCOUNTER — Encounter: Payer: Self-pay | Admitting: Podiatry

## 2021-01-06 DIAGNOSIS — M2041 Other hammer toe(s) (acquired), right foot: Secondary | ICD-10-CM | POA: Diagnosis not present

## 2021-01-06 DIAGNOSIS — D2371 Other benign neoplasm of skin of right lower limb, including hip: Secondary | ICD-10-CM | POA: Diagnosis not present

## 2021-01-06 NOTE — Progress Notes (Signed)
Subjective:  Patient ID: Thomas Morgan, male    DOB: 1951-07-21,  MRN: [id number] HPI Chief Complaint  Patient presents with  . Toe Pain    4th and 5th toes right - toes are rubbing together making them sore x 3-4 weeks, has spinal stenosis and diabetes - neuropathy symptoms  . Diabetes    Last a1c was 7.2  . New Patient (Initial Visit)    70 y.o. male presents with the above complaint.   ROS: Denies fever chills nausea vomiting muscle aches pains calf pain back pain chest pain shortness of breath.  Past Medical History:  Diagnosis Date  . Basal cell carcinoma   . Complication of anesthesia   . Personal history of tobacco use, presenting hazards to health 10/22/2015  . PONV (postoperative nausea and vomiting)    gets very sick from anesthesia  . Spinal stenosis    Past Surgical History:  Procedure Laterality Date  . BASAL CELL CARCINOMA EXCISION Left 2020   left arm  . CYST REMOVAL TRUNK  2020   on his back  . ELBOW SURGERY    . 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 SKIN CANCER EXCISION    . WRIST SURGERY Right    Trauma resulting in prosthetics in the right wrist and elbow    Current Outpatient Medications:  .  aspirin 81 MG EC tablet, Take 81 mg by mouth daily. , Disp: , Rfl:  .  Cholecalciferol 25 MCG (1000 UT) capsule, Take 1,000 Units by mouth daily., Disp: , Rfl:  .  ezetimibe (ZETIA) 10 MG tablet, TAKE 1 TABLET BY MOUTH EVERY DAY, Disp: 90 tablet, Rfl: 3 .  glucose blood (ONETOUCH VERIO) test strip, TEST ONCE DAILY  Dx: E11.9, Disp: 100 each, Rfl: 3 .  hydrochlorothiazide (HYDRODIURIL) 50 MG tablet, TAKE 1 TABLET BY MOUTH EVERY DAY, Disp: 90 tablet, Rfl: 4 .  lovastatin (MEVACOR) 40 MG tablet, TAKE 1 TABLET BY MOUTH EVERY DAY, Disp: 90 tablet, Rfl: 1 .  omeprazole (PRILOSEC) 40 MG capsule, Take 1 capsule (40 mg total) by mouth  daily., Disp: 90 capsule, Rfl: 1 .  OneTouch Delica Lancets 77O MISC, Use to check blood sugar once a day.  DX: E11.9, Disp: 100 each, Rfl: 3 .  traMADol (ULTRAM) 50 MG tablet, Take 50 mg by mouth every 6 (six) hours as needed. , Disp: , Rfl:  .  vitamin B-12 (CYANOCOBALAMIN) 1000 MCG tablet, Take 1,000 mcg by mouth daily., Disp: , Rfl:   Allergies  Allergen Reactions  . Atorvastatin     Aching and feeling down  . Crestor [Rosuvastatin] Other (See Comments)    Muscle aches  . Hydrocodone Other (See Comments)    Palpitations, increased heart rate  . Lescol  [Fluvastatin]     Muscle Aches   Review of Systems Objective:  There were no vitals filed for this visit.  General: Well developed, nourished, in no acute distress, alert and oriented x3   Dermatological: Skin is warm, dry and supple bilateral. Nails x 10 are well maintained; remaining integument appears unremarkable at this time. There are no open sores, no preulcerative lesions, no rash or signs of infection present.  Reactive hyperkeratotic lesion to the medial aspect fifth digit of the right foot lateral aspect of the fourth digit right foot.  Vascular: Dorsalis Pedis artery and Posterior Tibial artery pedal pulses  are 2/4 bilateral with immedate capillary fill time. Pedal hair growth present. No varicosities and no lower extremity edema present bilateral.   Neruologic: Grossly intact via light touch bilateral. Vibratory intact via tuning fork bilateral. Protective threshold with Semmes Wienstein monofilament intact to all pedal sites bilateral. Patellar and Achilles deep tendon reflexes 2+ bilateral. No Babinski or clonus noted bilateral.   Musculoskeletal: No gross boney pedal deformities bilateral. No pain, crepitus, or limitation noted with foot and ankle range of motion bilateral. Muscular strength 5/5 in all groups tested bilateral.  Adductovarus rotated hammertoe deformity with slight dorsiflexion at the metatarsophalangeal  joint.  The juxtaposition is irritating the medial aspect of the fifth digit right.  Gait: Unassisted, Nonantalgic.    Radiographs:  Radiographs taken today demonstrate an osseously mature individual with adductovarus rotated hammertoe deformity fourth right no acute findings are noted.  Assessment & Plan:   Assessment: Diabetes with diabetic peripheral neuropathy.  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  Benign soft tissue lesion.  Plan: Debrided benign soft tissue lesion.  Placed padding discussed the possible need for extensor tenotomy     [name]. [place], [place]

## 2021-01-07 ENCOUNTER — Other Ambulatory Visit (INDEPENDENT_AMBULATORY_CARE_PROVIDER_SITE_OTHER): Payer: Self-pay | Admitting: Vascular Surgery

## 2021-01-07 DIAGNOSIS — I739 Peripheral vascular disease, unspecified: Secondary | ICD-10-CM

## 2021-01-11 ENCOUNTER — Ambulatory Visit (INDEPENDENT_AMBULATORY_CARE_PROVIDER_SITE_OTHER): Payer: Medicare PPO | Admitting: Vascular Surgery

## 2021-01-11 ENCOUNTER — Ambulatory Visit (INDEPENDENT_AMBULATORY_CARE_PROVIDER_SITE_OTHER): Payer: Medicare PPO

## 2021-01-11 ENCOUNTER — Encounter (INDEPENDENT_AMBULATORY_CARE_PROVIDER_SITE_OTHER): Payer: Self-pay | Admitting: Vascular Surgery

## 2021-01-11 ENCOUNTER — Other Ambulatory Visit: Payer: Self-pay

## 2021-01-11 VITALS — BP 125/85 | HR 72 | Ht 70.0 in | Wt 193.0 lb

## 2021-01-11 DIAGNOSIS — M79605 Pain in left leg: Secondary | ICD-10-CM

## 2021-01-11 DIAGNOSIS — I719 Aortic aneurysm of unspecified site, without rupture: Secondary | ICD-10-CM | POA: Diagnosis not present

## 2021-01-11 DIAGNOSIS — M79604 Pain in right leg: Secondary | ICD-10-CM | POA: Diagnosis not present

## 2021-01-11 DIAGNOSIS — I739 Peripheral vascular disease, unspecified: Secondary | ICD-10-CM

## 2021-01-11 DIAGNOSIS — E1159 Type 2 diabetes mellitus with other circulatory complications: Secondary | ICD-10-CM

## 2021-01-11 DIAGNOSIS — I2510 Atherosclerotic heart disease of native coronary artery without angina pectoris: Secondary | ICD-10-CM

## 2021-01-11 DIAGNOSIS — E785 Hyperlipidemia, unspecified: Secondary | ICD-10-CM

## 2021-01-11 NOTE — Progress Notes (Signed)
MRN : [id number]  Thomas Morgan is a 70 y.o. (10/31/1950) male who presents with chief complaint of  Chief Complaint  Patient presents with   New Patient (Initial Visit)    Intermittent claudication . ABI   .  History of Present Illness:    The patient is seen for evaluation of painful lower extremities. Patient notes the pain is variable and not always associated with activity.  The pain is somewhat consistent day to day occurring on most days. The patient notes the pain also occurs with standing and routinely seems worse as the day wears on. The pain has been progressive over the past several years. The patient states these symptoms are causing  a profound negative impact on quality of life and daily activities.  The patient denies rest pain or dangling of an extremity off the side of the bed during the night for relief. No open wounds or sores at this time. No history of DVT or phlebitis. No prior interventions or surgeries.  There is a  history of back problems and DJD of the lumbar and sacral spine.   ABI's are normal bilaterally  Current Meds  Medication Sig   aspirin 81 MG EC tablet Take 81 mg by mouth daily.    Cholecalciferol 25 MCG (1000 UT) capsule Take 1,000 Units by mouth daily.   ezetimibe (ZETIA) 10 MG tablet TAKE 1 TABLET BY MOUTH EVERY DAY   glucose blood (ONETOUCH VERIO) test strip TEST ONCE DAILY  Dx: E11.9   hydrochlorothiazide (HYDRODIURIL) 50 MG tablet TAKE 1 TABLET BY MOUTH EVERY DAY   lovastatin (MEVACOR) 40 MG tablet TAKE 1 TABLET BY MOUTH EVERY DAY   omeprazole (PRILOSEC) 40 MG capsule Take 1 capsule (40 mg total) by mouth daily.   OneTouch Delica Lancets 33I MISC Use to check blood sugar once a day.  DX: E11.9   traMADol (ULTRAM) 50 MG tablet Take 50 mg by mouth every 6 (six) hours as needed.    vitamin B-12 (CYANOCOBALAMIN) 1000 MCG tablet Take 1,000 mcg by mouth daily.    Past Medical History:  Diagnosis Date   Basal cell carcinoma     Complication of anesthesia    Personal history of tobacco use, presenting hazards to health 10/22/2015   PONV (postoperative nausea and vomiting)    gets very sick from anesthesia   Spinal stenosis     Past Surgical History:  Procedure Laterality Date   BASAL CELL CARCINOMA EXCISION Left 2020   left arm   CYST REMOVAL TRUNK  2020   on his back   [place] Left 03/24/2017   Procedure: EXCISION MORTON'S NEUROMA;  Surgeon: Sharlotte Alamo, DPM;  Location: [facility] ORS;  Service: Podiatry;  Laterality: Left;   SKIN CANCER EXCISION     WRIST SURGERY Right    Trauma resulting in prosthetics in the right wrist and elbow    Social History Social History   Tobacco Use   Smoking status: Current Every Day Smoker    Packs/day: 1.00    Years: 48.00    Pack years: 48.00    Types: Cigarettes   Smokeless tobacco: Never Used  Scientific laboratory technician Use: Never used  Substance Use Topics   Alcohol use: No    Alcohol/week: 0.0 standard drinks   Drug use: No    Family History Family History  Problem Relation Age of Onset   Cancer Mother  Lung   Hypertension Mother    Heart Problems Father    AAA (abdominal aortic aneurysm) Father    Heart attack Brother    Hypercholesterolemia Brother   No family history of bleeding/clotting disorders, porphyria or autoimmune disease   Allergies  Allergen Reactions   Atorvastatin     Aching and feeling down   Crestor [Rosuvastatin] Other (See Comments)    Muscle aches   Hydrocodone Other (See Comments)    Palpitations, increased heart rate   Lescol  [Fluvastatin]     Muscle Aches     REVIEW OF SYSTEMS (Negative unless checked)  Constitutional: [] Weight loss  [] Fever  [] Chills Cardiac: [] Chest pain   [] Chest pressure   [] Palpitations   [] Shortness of breath when laying flat   [] Shortness of breath with exertion. Vascular:  [x] Pain in legs with walking   [x] Pain in legs at rest   [] History of DVT   [] Phlebitis   [] Swelling in legs   [] Varicose veins   [] Non-healing ulcers Pulmonary:   [] Uses home oxygen   [] Productive cough   [] Hemoptysis   [] Wheeze  [] COPD   [] Asthma Neurologic:  [] Dizziness   [] Seizures   [] History of stroke   [] History of TIA  [] Aphasia   [] Vissual changes   [] Weakness or numbness in arm   [] Weakness or numbness in leg Musculoskeletal:   [] Joint swelling   [x] Joint pain   [] Low back pain Hematologic:  [] Easy bruising  [] Easy bleeding   [] Hypercoagulable state   [] Anemic Gastrointestinal:  [] Diarrhea   [] Vomiting  [] Gastroesophageal reflux/heartburn   [] Difficulty swallowing. Genitourinary:  [] Chronic kidney disease   [] Difficult urination  [] Frequent urination   [] Blood in urine Skin:  [] Rashes   [] Ulcers  Psychological:  [] History of anxiety   []  History of major depression.  Physical Examination  Vitals:   01/11/21 0835  BP: 125/85  Pulse: 72  Weight: 193 lb (87.5 kg)  Height: 5\' 10"  (1.778 m)   Body mass index is 27.69 kg/m. Gen: WD/WN, NAD Head: [place]/AT, No temporalis wasting.  Ear/Nose/Throat: Hearing grossly intact, nares w/o erythema or drainage, poor dentition Eyes: PER, EOMI, sclera nonicteric.  Neck: Supple, no masses.  No bruit or JVD.  Pulmonary:  Good air movement, clear to auscultation bilaterally, no use of accessory muscles.  Cardiac: RRR, normal S1, S2, no Murmurs. Vascular:  Vessel Right Left  Radial Palpable Palpable  PT Palpable Palpable  DP Palpable Palpable  Gastrointestinal: soft, non-distended. No guarding/no peritoneal signs.  Musculoskeletal: M/S 5/5 throughout.  No deformity or atrophy.  Neurologic: CN 2-12 intact. Pain and light touch intact in extremities.  Symmetrical.  Speech is fluent. Motor exam as listed above. Psychiatric: Judgment intact, Mood & affect appropriate for pt's clinical situation. Dermatologic: No rashes or ulcers noted.  No changes consistent with cellulitis. Lymph : No Cervical  lymphadenopathy, no lichenification or skin changes of chronic lymphedema.  CBC Lab Results  Component Value Date   WBC 11.6 (H) 04/10/2020   HGB 14.5 04/10/2020   HCT 42.4 04/10/2020   MCV 94 04/10/2020   PLT 317 04/10/2020    BMET    Component Value Date/Time   NA 141 04/10/2020 1456   K 3.6 04/10/2020 1456   CL 104 04/10/2020 1456   CO2 22 04/10/2020 1456   GLUCOSE 186 (H) 04/10/2020 1456   GLUCOSE 98 03/16/2017 1400   BUN 19 04/10/2020 1456   CREATININE 0.76 04/10/2020 1456   CALCIUM 9.3 04/10/2020 1456   GFRNONAA 93 04/10/2020 1456  GFRAA 108 04/10/2020 1456   CrCl cannot be calculated (Patient's most recent lab result is older than the maximum 21 days allowed.).  COAG No results found for: INR, PROTIME  Radiology DG Foot Complete Right  Result Date: 01/06/2021 Please see detailed radiograph report in office note.    Assessment/Plan 1. Pain in both lower extremities Recommend:  I do not find evidence of Vascular pathology that would explain the patient's symptoms  The patient has atypical pain symptoms for vascular disease  I do not find evidence of Vascular pathology that would explain the patient's symptoms and I suspect the patient is c/o pseudoclaudication.  Patient should have an evaluation of his LS spine which I defer to the primary service.  Noninvasive studies including venous ultrasound of the legs do not identify vascular problems  The patient should continue walking and begin a more formal exercise program. The patient should continue his antiplatelet therapy and aggressive treatment of the lipid abnormalities. The patient should begin wearing graduated compression socks 15-20 mmHg strength to control her mild edema.  Patient will follow-up with me on a PRN basis  Further work-up of her lower extremity pain is deferred to the primary service     2. PAD (peripheral artery disease) (HCC) Recommend:  I do not find evidence of Vascular  pathology that would explain the patient's symptoms  The patient has atypical pain symptoms for vascular disease  I do not find evidence of Vascular pathology that would explain the patient's symptoms and I suspect the patient is c/o pseudoclaudication.  Patient should have an evaluation of his LS spine which I defer to the primary service.  Noninvasive studies including venous ultrasound of the legs do not identify vascular problems  The patient should continue walking and begin a more formal exercise program. The patient should continue his antiplatelet therapy and aggressive treatment of the lipid abnormalities. The patient should begin wearing graduated compression socks 15-20 mmHg strength to control her mild edema.  Patient will follow-up with me on a PRN basis  Further work-up of her lower extremity pain is deferred to the primary service     3. Aortic aneurysm without rupture, unspecified portion of aorta (HCC) No surgery or intervention at this time. The patient has an asymptomatic abdominal aortic aneurysm that is less than 4 cm in maximal diameter.  I have discussed the natural history of abdominal aortic aneurysm and the small risk of rupture for aneurysm less than 5 cm in size.  However, as these small aneurysms tend to enlarge over time, continued surveillance with ultrasound or CT scan is mandatory.  I have also discussed optimizing medical management with hypertension and lipid control and the importance of abstinence from tobacco.  The patient is also encouraged to exercise a minimum of 30 minutes 4 times a week.  Should the patient develop new onset abdominal or back pain or signs of peripheral embolization they are instructed to seek medical attention immediately and to alert the physician providing care that they have an aneurysm.  The patient voices their understanding. The patient will return in 12 months with an aortic duplex.  - VAS US AORTA/IVC/ILIACS; Future  4.  Coronary arteriosclerosis Continue cardiac and antihypertensive medications as already ordered and reviewed, no changes at this time.  Continue statin as ordered and reviewed, no changes at this time  Nitrates PRN for chest pain   5. Type 2 diabetes mellitus with other circulatory complication, without long-term current use of insulin (HCC) Continue  hypoglycemic medications as already ordered, these medications have been reviewed and there are no changes at this time.  Hgb A1C to be monitored as already arranged by primary service   6. Hyperlipidemia, unspecified hyperlipidemia type Continue statin as ordered and reviewed, no changes at this time    Hortencia Pilar, MD  01/11/2021 8:48 AM

## 2021-01-12 ENCOUNTER — Encounter (INDEPENDENT_AMBULATORY_CARE_PROVIDER_SITE_OTHER): Payer: Self-pay | Admitting: Vascular Surgery

## 2021-01-12 DIAGNOSIS — I739 Peripheral vascular disease, unspecified: Secondary | ICD-10-CM | POA: Insufficient documentation

## 2021-01-12 DIAGNOSIS — M79606 Pain in leg, unspecified: Secondary | ICD-10-CM | POA: Insufficient documentation

## 2021-01-14 ENCOUNTER — Encounter (INDEPENDENT_AMBULATORY_CARE_PROVIDER_SITE_OTHER): Payer: Self-pay | Admitting: Vascular Surgery

## 2021-01-21 DIAGNOSIS — M4316 Spondylolisthesis, lumbar region: Secondary | ICD-10-CM | POA: Diagnosis not present

## 2021-01-21 DIAGNOSIS — M5416 Radiculopathy, lumbar region: Secondary | ICD-10-CM | POA: Diagnosis not present

## 2021-02-22 ENCOUNTER — Other Ambulatory Visit: Payer: Self-pay | Admitting: Family Medicine

## 2021-02-22 NOTE — Telephone Encounter (Signed)
Requested Prescriptions  Pending Prescriptions Disp Refills  . omeprazole (PRILOSEC) 40 MG capsule [Pharmacy Med Name: OMEPRAZOLE 40MG  CAPSULES] 90 capsule 1    Sig: TAKE 1 CAPSULE(40 MG) BY MOUTH DAILY     Gastroenterology: Proton Pump Inhibitors Passed - 02/22/2021 12:33 PM      Passed - Valid encounter within last 12 months    Recent Outpatient Visits          2 months ago Right foot pain   [facility] Birdie Sons, MD   4 months ago Type 2 diabetes mellitus with other circulatory complication, without long-term current use of insulin [facility])   [facility] Birdie Sons, MD   5 months ago Upper respiratory tract infection, unspecified type   [facility] Birdie Sons, MD   8 months ago Impacted cerumen of right ear   [facility] Carles Collet M, [place]   10 months ago Annual physical exam   [facility] Birdie Sons, MD      Future Appointments            In 2 months Fisher, Kirstie Peri, MD [facility], [place]

## 2021-03-02 DIAGNOSIS — M5416 Radiculopathy, lumbar region: Secondary | ICD-10-CM | POA: Diagnosis not present

## 2021-03-30 DIAGNOSIS — M4316 Spondylolisthesis, lumbar region: Secondary | ICD-10-CM | POA: Diagnosis not present

## 2021-03-30 DIAGNOSIS — M5416 Radiculopathy, lumbar region: Secondary | ICD-10-CM | POA: Diagnosis not present

## 2021-04-07 ENCOUNTER — Other Ambulatory Visit: Payer: Self-pay | Admitting: Family Medicine

## 2021-04-07 ENCOUNTER — Ambulatory Visit: Payer: Medicare PPO | Admitting: Podiatry

## 2021-04-07 ENCOUNTER — Other Ambulatory Visit: Payer: Self-pay

## 2021-04-07 ENCOUNTER — Encounter: Payer: Self-pay | Admitting: Podiatry

## 2021-04-07 DIAGNOSIS — I10 Essential (primary) hypertension: Secondary | ICD-10-CM

## 2021-04-07 DIAGNOSIS — D2371 Other benign neoplasm of skin of right lower limb, including hip: Secondary | ICD-10-CM | POA: Diagnosis not present

## 2021-04-07 DIAGNOSIS — M2041 Other hammer toe(s) (acquired), right foot: Secondary | ICD-10-CM

## 2021-04-07 NOTE — Progress Notes (Signed)
He presents today states that this fifth toe is still very unpleasant and painful as he refers to the fifth digit of the right foot states he has been placing padding and it really just has not gotten any better.  Objective: Vital signs are stable he is alert and oriented x3.  Pulses are palpable.  Fifth digit of the right foot demonstrates an adductovarus rotated arthritic hammertoe fifth right.  Medial exostosis is resulted and reactive hyperkeratotic lesion along the medial border of the nail secondary to his juxtaposition to the fourth toe.  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  Plan: Discussed etiology pathology conservative surgical therapies I debrided the reactive hyperkeratotic lesion for him today.  We discussed in great detail today surgical derotation of the toe as well as exostectomy of the fifth toe.  He understands and is amenable to and consented him for that.  We did discuss the possible postop complications which may include but are not limited to postop pain bleeding swelling infection recurrence need for further surgery overcorrection under correction also digit loss of limb loss of life.  I will follow-up with him in the near future for surgical intervention we provided him with information regarding the surgery center and anesthesia group.  Provided him with a Darco shoe for postop recovery.

## 2021-04-07 NOTE — Telephone Encounter (Signed)
Patient has appointment 04/28/21- will need updated labs. Refill per appointment protocol.

## 2021-04-09 ENCOUNTER — Telehealth: Payer: Self-pay

## 2021-04-09 NOTE — Telephone Encounter (Signed)
DOS 04/16/2021  HAMMERTOE REPAIR 5TH RT - 28285 EXOSTECTOMY 5TH RT - 28108  [employer]   CPT 28108 NO AUTH REQUIRED PER WEBSITE  Procedure Hammertoe, Claw Toe, or Mallet Toe Surgical Treatment Code Description [id number] Correction, hammertoe (eg, interphalangeal fusion, partial or total phalangectomy) Diagnosis M20.41 Other hammer toe(s) (acquired), right foot (primary) D23.71 Other benign neoplasm of skin of right lower limb, including hip Attention [place] [employer] date 04/16/2021 - 07/15/2021  [employer] authorization number [id number]

## 2021-04-15 ENCOUNTER — Other Ambulatory Visit: Payer: Self-pay | Admitting: Podiatry

## 2021-04-15 MED ORDER — CEPHALEXIN 500 MG PO CAPS
500.0000 mg | ORAL_CAPSULE | Freq: Three times a day (TID) | ORAL | 0 refills | Status: DC
Start: 2021-04-15 — End: 2021-04-23

## 2021-04-15 MED ORDER — ONDANSETRON HCL 4 MG PO TABS: 4.0000 mg | ORAL_TABLET | Freq: Three times a day (TID) | ORAL | 0 refills | Status: DC | PRN

## 2021-04-15 MED ORDER — OXYCODONE-ACETAMINOPHEN 10-325 MG PO TABS: 1.0000 | ORAL_TABLET | Freq: Three times a day (TID) | ORAL | 0 refills | Status: AC | PRN

## 2021-04-16 DIAGNOSIS — D2371 Other benign neoplasm of skin of right lower limb, including hip: Secondary | ICD-10-CM | POA: Diagnosis not present

## 2021-04-16 DIAGNOSIS — D1631 Benign neoplasm of short bones of right lower limb: Secondary | ICD-10-CM | POA: Diagnosis not present

## 2021-04-16 DIAGNOSIS — M2041 Other hammer toe(s) (acquired), right foot: Secondary | ICD-10-CM | POA: Diagnosis not present

## 2021-04-16 HISTORY — PX: FOOT SURGERY: SHX648

## 2021-04-21 ENCOUNTER — Encounter: Payer: Self-pay | Admitting: Podiatry

## 2021-04-21 ENCOUNTER — Ambulatory Visit (INDEPENDENT_AMBULATORY_CARE_PROVIDER_SITE_OTHER): Payer: Medicare PPO

## 2021-04-21 ENCOUNTER — Other Ambulatory Visit: Payer: Self-pay

## 2021-04-21 ENCOUNTER — Ambulatory Visit (INDEPENDENT_AMBULATORY_CARE_PROVIDER_SITE_OTHER): Payer: Medicare PPO | Admitting: Podiatry

## 2021-04-21 DIAGNOSIS — Z9889 Other specified postprocedural states: Secondary | ICD-10-CM

## 2021-04-21 DIAGNOSIS — M2041 Other hammer toe(s) (acquired), right foot: Secondary | ICD-10-CM | POA: Diagnosis not present

## 2021-04-21 DIAGNOSIS — D2371 Other benign neoplasm of skin of right lower limb, including hip: Secondary | ICD-10-CM

## 2021-04-21 NOTE — Progress Notes (Signed)
Mr. Sporer presents today for his first postop visit he is status post derotational arthroplasty fifth digit right foot with exostectomy.  States that he has had very little pain.  He denies fever chills nausea vomiting muscle aches and pains.  Objective: Presents today Darco shoe intact dry sterile dressing intact once removed demonstrates no erythema to some mild edema no cellulitis drainage or odor radiographs taken today demonstrate a complete exostectomy distal phalanx medial aspect and a arthroplasty at the PIPJ for fifth digit right foot.  Assessment well-healing surgical foot.  Plan: Follow-up with [place] in 1 week for suture removal.  He will call with questions or concerns I redressed the foot today dressed a compressive dressing continue the use of the Darco shoe.

## 2021-04-28 ENCOUNTER — Other Ambulatory Visit: Payer: Self-pay | Admitting: Family Medicine

## 2021-04-28 ENCOUNTER — Ambulatory Visit (INDEPENDENT_AMBULATORY_CARE_PROVIDER_SITE_OTHER): Payer: Medicare PPO | Admitting: Podiatry

## 2021-04-28 ENCOUNTER — Encounter: Payer: Self-pay | Admitting: Podiatry

## 2021-04-28 ENCOUNTER — Ambulatory Visit (INDEPENDENT_AMBULATORY_CARE_PROVIDER_SITE_OTHER): Payer: Medicare PPO | Admitting: Family Medicine

## 2021-04-28 ENCOUNTER — Other Ambulatory Visit: Payer: Self-pay

## 2021-04-28 ENCOUNTER — Encounter: Payer: Self-pay | Admitting: Family Medicine

## 2021-04-28 VITALS — BP 134/83 | HR 72 | Ht 70.5 in | Wt 198.0 lb

## 2021-04-28 DIAGNOSIS — I10 Essential (primary) hypertension: Secondary | ICD-10-CM

## 2021-04-28 DIAGNOSIS — Z125 Encounter for screening for malignant neoplasm of prostate: Secondary | ICD-10-CM | POA: Diagnosis not present

## 2021-04-28 DIAGNOSIS — E559 Vitamin D deficiency, unspecified: Secondary | ICD-10-CM

## 2021-04-28 DIAGNOSIS — E538 Deficiency of other specified B group vitamins: Secondary | ICD-10-CM | POA: Diagnosis not present

## 2021-04-28 DIAGNOSIS — Z860101 Personal history of adenomatous and serrated colon polyps: Secondary | ICD-10-CM

## 2021-04-28 DIAGNOSIS — Z289 Immunization not carried out for unspecified reason: Secondary | ICD-10-CM | POA: Diagnosis not present

## 2021-04-28 DIAGNOSIS — D2371 Other benign neoplasm of skin of right lower limb, including hip: Secondary | ICD-10-CM

## 2021-04-28 DIAGNOSIS — Z86010 Personal history of colonic polyps: Secondary | ICD-10-CM | POA: Diagnosis not present

## 2021-04-28 DIAGNOSIS — E1159 Type 2 diabetes mellitus with other circulatory complications: Secondary | ICD-10-CM | POA: Diagnosis not present

## 2021-04-28 DIAGNOSIS — I719 Aortic aneurysm of unspecified site, without rupture: Secondary | ICD-10-CM

## 2021-04-28 DIAGNOSIS — Z0000 Encounter for general adult medical examination without abnormal findings: Secondary | ICD-10-CM | POA: Diagnosis not present

## 2021-04-28 DIAGNOSIS — E785 Hyperlipidemia, unspecified: Secondary | ICD-10-CM

## 2021-04-28 DIAGNOSIS — M2041 Other hammer toe(s) (acquired), right foot: Secondary | ICD-10-CM

## 2021-04-28 DIAGNOSIS — Z9889 Other specified postprocedural states: Secondary | ICD-10-CM

## 2021-04-28 MED ORDER — GABAPENTIN 100 MG PO CAPS: 100.0000 mg | ORAL_CAPSULE | Freq: Every day | ORAL | 3 refills | Status: DC

## 2021-04-28 MED ORDER — SHINGRIX 50 MCG/0.5ML IM SUSR: 0.5000 mL | Freq: Once | INTRAMUSCULAR | 0 refills | Status: AC

## 2021-04-28 NOTE — Progress Notes (Signed)
He presents today date of surgery 04/16/2021 status post derotational arthroplasty fifth digit right foot with exostectomy medial aspect.  States that is feeling fine.  Objective: Vital signs are stable alert oriented x3 there is no erythema no edema cellulitis drainage or odor sutures are intact margins well coapted and sutures were removed today.  No signs of infection noted.  Toe sits rectus.  Assessment: Well-healing surgical toe.  Plan: Redressed today with [place] demonstrated to him and his wife how to dress the toe on a daily basis and allow him to get into some sandals but no tight shoes as of yet he needs a least another 2 weeks for that.

## 2021-04-28 NOTE — Patient Instructions (Addendum)
I strongly recommend at least one booster dose (a third shot) of the Covid vaccine for all adults. People at high risk for serious Covid infections should have a second booster dose (a fourth shot) 4-6 months after the first booster.

## 2021-04-28 NOTE — Progress Notes (Signed)
Complete Physical     Patient: Thomas Morgan, Male    DOB: 03/22/1951, 70 y.o.   MRN: [id number] Visit Date: 04/28/2021  Today's Provider: Lelon Huh, MD   Chief Complaint  Patient presents with   Annual Exam    Subjective    Thomas Morgan is a 70 y.o. male who presents today for a complete physical exam.  He reports consuming a general diet.  Exercises some.  He generally feels well. He reports sleeping fairly well. He does not have additional problems to discuss today.   HPI Diabetes Mellitus Type II, Follow-up  Lab Results  Component Value Date   HGBA1C 7.2 (A) 10/05/2020   HGBA1C 6.9 (H) 04/10/2020   HGBA1C 7.2 (A) 12/24/2019   Wt Readings from Last 3 Encounters:  04/28/21 198 lb (89.8 kg)  01/11/21 193 lb (87.5 kg)  12/23/20 199 lb (90.3 kg)   Last seen for diabetes 4 months ago.  Management since then includes continue same medication. He reports excellent compliance with treatment. He is not having side effects.  Symptoms: No fatigue No foot ulcerations  No appetite changes No nausea  No paresthesia of the feet  No polydipsia  No polyuria No visual disturbances   No vomiting     Home blood sugar records:  only checks occasionally.  Episodes of hypoglycemia? No    Current insulin regiment: none Most Recent Eye Exam: 06/16/2020  Pertinent Labs: Lab Results  Component Value Date   CHOL 142 04/10/2020   HDL 37 (L) 04/10/2020   LDLCALC 90 04/10/2020   TRIG 79 04/10/2020   CHOLHDL 3.8 04/10/2020   Lab Results  Component Value Date   NA 141 04/10/2020   K 3.6 04/10/2020   CREATININE 0.76 04/10/2020   GFRNONAA 93 04/10/2020   GFRAA 108 04/10/2020   GLUCOSE 186 (H) 04/10/2020     ---------------------------------------------------------------------------------------------------   Hypertension, follow-up  BP Readings from Last 3 Encounters:  04/28/21 134/83  01/11/21 125/85  12/23/20 122/78   Wt Readings from Last 3 Encounters:   04/28/21 198 lb (89.8 kg)  01/11/21 193 lb (87.5 kg)  12/23/20 199 lb (90.3 kg)     He was last seen for hypertension 6 months ago.  BP at that visit was 141/83. Management since that visit includes advising patient to work on diet and exercise.  He reports excellent compliance with treatment. He is not having side effects.  He is following a Regular diet. He is exercising. He does not smoke.  Use of agents associated with hypertension: none.   Outside blood pressures are checked occasionally.   Pertinent labs: Lab Results  Component Value Date   CHOL 142 04/10/2020   HDL 37 (L) 04/10/2020   LDLCALC 90 04/10/2020   TRIG 79 04/10/2020   CHOLHDL 3.8 04/10/2020   Lab Results  Component Value Date   NA 141 04/10/2020   K 3.6 04/10/2020   CREATININE 0.76 04/10/2020   GFRNONAA 93 04/10/2020   GFRAA 108 04/10/2020   GLUCOSE 186 (H) 04/10/2020     The 10-year ASCVD risk score Mikey Bussing DC Jr., et al., 2013) is: 44.1%   ---------------------------------------------------------------------------------------------------      Medications: Outpatient Medications Prior to Visit  Medication Sig   aspirin 81 MG EC tablet Take 81 mg by mouth daily.    cephALEXin (KEFLEX) 500 MG capsule Take 1 capsule (500 mg total) by mouth 3 (three) times daily.   Cholecalciferol 25 MCG (1000 UT) capsule Take  1,000 Units by mouth daily.   ezetimibe (ZETIA) 10 MG tablet TAKE 1 TABLET BY MOUTH EVERY DAY   glucose blood (ONETOUCH VERIO) test strip TEST ONCE DAILY  Dx: E11.9   hydrochlorothiazide (HYDRODIURIL) 50 MG tablet TAKE 1 TABLET BY MOUTH EVERY DAY   lovastatin (MEVACOR) 40 MG tablet TAKE 1 TABLET BY MOUTH EVERY DAY   omeprazole (PRILOSEC) 40 MG capsule TAKE 1 CAPSULE(40 MG) BY MOUTH DAILY   OneTouch Delica Lancets 42A MISC Use to check blood sugar once a day.  DX: E11.9   rOPINIRole (REQUIP) 0.25 MG tablet 1-2 PO QHS   traMADol (ULTRAM) 50 MG tablet Take 50 mg by mouth every 6 (six) hours as  needed.    vitamin B-12 (CYANOCOBALAMIN) 1000 MCG tablet Take 1,000 mcg by mouth daily.   ondansetron (ZOFRAN) 4 MG tablet Take 1 tablet (4 mg total) by mouth every 8 (eight) hours as needed.   No facility-administered medications prior to visit.    Allergies  Allergen Reactions   Atorvastatin     Aching and feeling down   Crestor [Rosuvastatin] Other (See Comments)    Muscle aches   Hydrocodone Other (See Comments)    Palpitations, increased heart rate   Lescol  [Fluvastatin]     Muscle Aches    Patient Care Team: Birdie Sons, MD as PCP - General (Family Medicine) Vladimir Crofts, MD as Consulting Physician (Neurology) Erline Levine, MD as Consulting Physician (Neurosurgery)  Review of Systems  Constitutional: Negative.   HENT: Negative.    Eyes: Negative.   Respiratory: Negative.    Cardiovascular: Negative.   Gastrointestinal: Negative.   Endocrine: Negative.   Musculoskeletal:  Positive for back pain. Negative for arthralgias, gait problem, joint swelling, myalgias, neck pain and neck stiffness.  Skin: Negative.   Allergic/Immunologic: Negative.   Neurological: Negative.   Hematological: Negative.   Psychiatric/Behavioral: Negative.         Objective    Vitals: BP 134/83 (BP Location: Right Arm, Patient Position: Sitting, Cuff Size: Large)   Pulse 72   Ht 5' 10.5" (1.791 m)   Wt 198 lb (89.8 kg)   SpO2 98%   BMI 28.01 kg/m    Physical Exam  General Appearance:     Overweight male. Alert, cooperative, in no acute distress, appears stated age  Head:    Normocephalic, without obvious abnormality, atraumatic  Eyes:    PERRL, conjunctiva/corneas clear, EOM's intact, fundi    benign, both eyes       Ears:    Normal TM's and external ear canals, both ears  Neck:   Supple, symmetrical, trachea midline, no adenopathy;       thyroid:  No enlargement/tenderness/nodules; no carotid   bruit or JVD  Back:     Symmetric, no curvature, ROM normal, no CVA  tenderness  Lungs:     Clear to auscultation bilaterally, respirations unlabored  Chest wall:    No tenderness or deformity  Heart:    Normal heart rate. Normal rhythm. No murmurs, rubs, or gallops.  S1 and S2 normal  Abdomen:     Soft, non-tender, bowel sounds active all four quadrants,    no masses, no organomegaly  Genitalia:    deferred  Rectal:    deferred  Extremities:   All extremities are intact. No cyanosis or edema  Pulses:   2+ and symmetric all extremities  Skin:   Skin color, texture, turgor normal, no rashes or lesions  Lymph nodes:  Cervical, supraclavicular, and axillary nodes normal  Neurologic:   CNII-XII intact. Normal strength, sensation and reflexes      throughout      Assessment & Plan     1. Annual physical exam Generally doing well. Encourage healthy diet and exercise.   2. Type 2 diabetes mellitus with other circulatory complication, without long-term current use of insulin (HCC) Diet controlled.  - Hemoglobin A1c - Urine Microalbumin w/creat. ratio  3. Primary hypertension Well controlled.  Continue current medications.    4. Vitamin D deficiency  - VITAMIN D 25 Hydroxy (Vit-D Deficiency, Fractures)  5. History of adenomatous polyp of colon   6. Aortic aneurysm without rupture, unspecified portion of aorta (HCC) Aortic ultrasound due 04/2022 - CBC - Comprehensive metabolic panel - Lipid panel  7. B12 deficiency  - Vitamin B12  8. Prostate cancer screening  - PSA Total (Reflex To Free) ([facility] only)  9. Prescription for Shingrix. Vaccine not administered in office.   - Zoster Vaccine Adjuvanted [facility]) injection; Inject 0.5 mLs into the muscle once for 1 dose.  Dispense: 0.5 mL; Refill: 0   Encouraged to get Covid booster. No vaccine available in our office to offer him.     The entirety of the information documented in the History of Present Illness, Review of Systems and Physical Exam were personally obtained by me. Portions of  this information were initially documented by the CMA and reviewed by me for thoroughness and accuracy.     Lelon Huh, MD  [facility] [phone] (phone) [phone] (fax)  [place]

## 2021-04-28 NOTE — Progress Notes (Signed)
Annual Wellness Visit     Patient: Thomas Morgan, Male    DOB: Mar 16, 1951, 70 y.o.   MRN: [id number] Visit Date: 04/28/2021  Today's Provider: Lelon Huh, MD   Chief Complaint  Patient presents with   Annual Exam   Subjective    Thomas Morgan is a 70 y.o. male who presents today for his Annual Wellness Visit.     Medications: Outpatient Medications Prior to Visit  Medication Sig   aspirin 81 MG EC tablet Take 81 mg by mouth daily.    Cholecalciferol 25 MCG (1000 UT) capsule Take 1,000 Units by mouth daily.   ezetimibe (ZETIA) 10 MG tablet TAKE 1 TABLET BY MOUTH EVERY DAY   glucose blood (ONETOUCH VERIO) test strip TEST ONCE DAILY  Dx: E11.9   hydrochlorothiazide (HYDRODIURIL) 50 MG tablet TAKE 1 TABLET BY MOUTH EVERY DAY   omeprazole (PRILOSEC) 40 MG capsule TAKE 1 CAPSULE(40 MG) BY MOUTH DAILY   OneTouch Delica Lancets 66Y MISC Use to check blood sugar once a day.  DX: E11.9   rOPINIRole (REQUIP) 0.25 MG tablet 1-2 PO QHS   traMADol (ULTRAM) 50 MG tablet Take 50 mg by mouth every 6 (six) hours as needed.    vitamin B-12 (CYANOCOBALAMIN) 1000 MCG tablet Take 1,000 mcg by mouth daily.   [DISCONTINUED] lovastatin (MEVACOR) 40 MG tablet TAKE 1 TABLET BY MOUTH EVERY DAY   ondansetron (ZOFRAN) 4 MG tablet Take 1 tablet (4 mg total) by mouth every 8 (eight) hours as needed.   [DISCONTINUED] cephALEXin (KEFLEX) 500 MG capsule Take 1 capsule (500 mg total) by mouth 3 (three) times daily.   No facility-administered medications prior to visit.    Allergies  Allergen Reactions   Atorvastatin     Aching and feeling down   Crestor [Rosuvastatin] Other (See Comments)    Muscle aches   Hydrocodone Other (See Comments)    Palpitations, increased heart rate   Lescol  [Fluvastatin]     Muscle Aches    Patient Care Team: Birdie Sons, MD as PCP - General (Family Medicine) Vladimir Crofts, MD as Consulting Physician (Neurology) Erline Levine, MD as Consulting Physician  (Neurosurgery)        Objective     Most recent functional status assessment: In your present state of health, do you have any difficulty performing the following activities: 04/28/2021  Hearing? N  Vision? N  Difficulty concentrating or making decisions? N  Walking or climbing stairs? N  Dressing or bathing? N  Doing errands, shopping? N  Some recent data might be hidden   Most recent fall risk assessment: Fall Risk  04/28/2021  Falls in the past year? 0  Number falls in past yr: 0  Injury with Fall? 0  Risk for fall due to : No Fall Risks  Follow up Falls evaluation completed    Most recent depression screenings: PHQ 2/9 Scores 04/28/2021 04/21/2020  PHQ - 2 Score 0 0  PHQ- 9 Score 2 -   Most recent cognitive screening: 6CIT Screen 04/28/2021  What Year? 0 points  What month? 0 points  What time? 0 points  Count back from 20 0 points  Months in reverse 0 points  Repeat phrase 2 points  Total Score 2   Most recent Audit-C alcohol use screening Alcohol Use Disorder Test (AUDIT) 04/28/2021  1. How often do you have a drink containing alcohol? 0  2. How many drinks containing alcohol do you have on  a typical day when you are drinking? 0  3. How often do you have six or more drinks on one occasion? 0  AUDIT-C Score 0  Alcohol Brief Interventions/Follow-up -   A score of 3 or more in women, and 4 or more in men indicates increased risk for alcohol abuse, EXCEPT if all of the points are from question 1   No results found for any visits on 04/28/21.  Assessment & Plan     Annual wellness visit done today including the all of the following: Reviewed patient's Family Medical History Reviewed and updated list of patient's medical providers Assessment of cognitive impairment was done Assessed patient's functional ability Established a written schedule for health screening [place] Completed and Reviewed  Exercise Activities and Dietary  recommendations  Goals      Exercise 150 min/wk Moderate Activity     Recommend to continue exercising 3 days a week for 1 hour.        Quit Smoking     Recommend to continue efforts to reduce smoking habits until no longer smoking.          Immunization History  Administered Date(s) Administered   Fluad Quad(high Dose 65+) 08/13/2019, 10/05/2020   Influenza, High Dose Seasonal PF 10/08/2015, 07/05/2016, 07/27/2018   Influenza-Unspecified 12/01/2017   PFIZER(Purple Top)SARS-COV-2 Vaccination 12/26/2019, 01/16/2020   Pneumococcal Conjugate-13 10/08/2015   Pneumococcal Polysaccharide-23 09/10/2008, 12/12/2016   Td 10/24/1997   Tdap 06/06/2007    Health Maintenance  Topic Date Due   Zoster Vaccines- Shingrix (1 of 2) Never done   COVID-19 Vaccine (3 - Pfizer risk series) 02/13/2020   URINE MICROALBUMIN  12/23/2020   HEMOGLOBIN A1C  04/05/2021   TETANUS/TDAP  08/12/2024 (Originally 06/05/2017)   INFLUENZA VACCINE  05/24/2021   OPHTHALMOLOGY EXAM  06/16/2021   COLONOSCOPY (Pts 45-40yrs Insurance coverage will need to be confirmed)  12/19/2021   FOOT EXAM  12/23/2021   Hepatitis C Screening  Completed   PNA vac Low Risk Adult  Completed   HPV VACCINES  Aged Out     Discussed health benefits of physical activity, and encouraged him to engage in regular exercise appropriate for his age and condition.           Lelon Huh, MD  [facility] [phone] (phone) [phone] (fax)  [place]

## 2021-04-29 LAB — CBC
Hematocrit: 44 % (ref 37.5–51.0)
Hemoglobin: 14.7 g/dL (ref 13.0–17.7)
MCH: 31.5 pg (ref 26.6–33.0)
MCHC: 33.4 g/dL (ref 31.5–35.7)
MCV: 94 fL (ref 79–97)
Platelets: 350 10*3/uL (ref 150–450)
RBC: 4.66 x10E6/uL (ref 4.14–5.80)
RDW: 11.9 % (ref 11.6–15.4)
WBC: 11.9 10*3/uL — ABNORMAL HIGH (ref 3.4–10.8)

## 2021-04-29 LAB — COMPREHENSIVE METABOLIC PANEL
ALT: 20 IU/L (ref 0–44)
AST: 15 IU/L (ref 0–40)
Albumin/Globulin Ratio: 2.3 — ABNORMAL HIGH (ref 1.2–2.2)
Albumin: 4.3 g/dL (ref 3.8–4.8)
Alkaline Phosphatase: 84 IU/L (ref 44–121)
BUN/Creatinine Ratio: 15 (ref 10–24)
BUN: 12 mg/dL (ref 8–27)
Bilirubin Total: 0.5 mg/dL (ref 0.0–1.2)
CO2: 25 mmol/L (ref 20–29)
Calcium: 9.1 mg/dL (ref 8.6–10.2)
Chloride: 99 mmol/L (ref 96–106)
Creatinine, Ser: 0.79 mg/dL (ref 0.76–1.27)
Globulin, Total: 1.9 g/dL (ref 1.5–4.5)
Glucose: 133 mg/dL — ABNORMAL HIGH (ref 65–99)
Potassium: 3.6 mmol/L (ref 3.5–5.2)
Sodium: 141 mmol/L (ref 134–144)
Total Protein: 6.2 g/dL (ref 6.0–8.5)
eGFR: 96 mL/min/{1.73_m2} (ref >59)

## 2021-04-29 LAB — LIPID PANEL
Chol/HDL Ratio: 5.7 ratio — ABNORMAL HIGH (ref 0.0–5.0)
Cholesterol, Total: 164 mg/dL (ref 100–199)
HDL: 29 mg/dL — ABNORMAL LOW (ref >39)
LDL Chol Calc (NIH): 103 mg/dL — ABNORMAL HIGH (ref 0–99)
Triglycerides: 182 mg/dL — ABNORMAL HIGH (ref 0–149)
VLDL Cholesterol Cal: 32 mg/dL (ref 5–40)

## 2021-04-29 LAB — MICROALBUMIN / CREATININE URINE RATIO
Creatinine, Urine: 56.8 mg/dL
Microalb/Creat Ratio: <5 mg/g creat (ref 0–29)
Microalbumin, Urine: <3 ug/mL

## 2021-04-29 LAB — HEMOGLOBIN A1C
Est. average glucose Bld gHb Est-mCnc: 154 mg/dL
Hgb A1c MFr Bld: 7 % — ABNORMAL HIGH (ref 4.8–5.6)

## 2021-04-29 LAB — VITAMIN B12: Vitamin B-12: 1721 pg/mL — ABNORMAL HIGH (ref 232–1245)

## 2021-04-29 LAB — VITAMIN D 25 HYDROXY (VIT D DEFICIENCY, FRACTURES): Vit D, 25-Hydroxy: 35.5 ng/mL (ref 30.0–100.0)

## 2021-04-29 LAB — PSA TOTAL (REFLEX TO FREE): Prostate Specific Ag, Serum: 0.4 ng/mL (ref 0.0–4.0)

## 2021-05-14 ENCOUNTER — Encounter: Payer: Medicare PPO | Admitting: Podiatry

## 2021-05-18 ENCOUNTER — Ambulatory Visit (INDEPENDENT_AMBULATORY_CARE_PROVIDER_SITE_OTHER): Payer: Medicare PPO

## 2021-05-18 ENCOUNTER — Other Ambulatory Visit: Payer: Self-pay

## 2021-05-18 ENCOUNTER — Ambulatory Visit (INDEPENDENT_AMBULATORY_CARE_PROVIDER_SITE_OTHER): Payer: Medicare PPO | Admitting: Podiatry

## 2021-05-18 DIAGNOSIS — Z9889 Other specified postprocedural states: Secondary | ICD-10-CM

## 2021-05-18 NOTE — Progress Notes (Signed)
   Subjective:  Patient presents today status post hammertoe arthroplasty right fifth digit. DOS: 04/16/2021.  Patient states that he is doing very well.  He has begun wearing closed toed shoes with no pain.  He presents for further treatment and evaluation  Past Medical History:  Diagnosis Date   Basal cell carcinoma    Complication of anesthesia    Personal history of tobacco use, presenting hazards to health 10/22/2015   PONV (postoperative nausea and vomiting)    gets very sick from anesthesia   Spinal stenosis       Objective/Physical Exam Neurovascular status intact.  Skin incisions appear to be well coapted and healed. No sign of infectious process noted. No dehiscence. No active bleeding noted. Moderate edema noted to the toe.   Assessment: 1. s/p hammertoe arthroplasty fifth digit right. DOS: 04/16/2021   Plan of Care:  1. Patient was evaluated. 2.  I explained to the patient that the swelling of the toe should resolve over the next few months. 3.  Patient may slowly increase to full activity no restrictions.  Recommend good supportive shoes that do not constrict the toebox area 4.  Return to clinic as needed    Edrick Kins, DPM [facility]  Dr. Edrick Kins, DPM    [address], [place] [address]                Office [phone]  Fax [phone]

## 2021-05-26 ENCOUNTER — Encounter: Payer: Medicare PPO | Admitting: Podiatry

## 2021-06-02 ENCOUNTER — Encounter: Payer: Medicare PPO | Admitting: Podiatry

## 2021-06-16 DIAGNOSIS — M5416 Radiculopathy, lumbar region: Secondary | ICD-10-CM | POA: Diagnosis not present

## 2021-06-22 DIAGNOSIS — D2261 Melanocytic nevi of right upper limb, including shoulder: Secondary | ICD-10-CM | POA: Diagnosis not present

## 2021-06-22 DIAGNOSIS — D225 Melanocytic nevi of trunk: Secondary | ICD-10-CM | POA: Diagnosis not present

## 2021-06-22 DIAGNOSIS — L814 Other melanin hyperpigmentation: Secondary | ICD-10-CM | POA: Diagnosis not present

## 2021-06-22 DIAGNOSIS — D2271 Melanocytic nevi of right lower limb, including hip: Secondary | ICD-10-CM | POA: Diagnosis not present

## 2021-06-22 DIAGNOSIS — Z85828 Personal history of other malignant neoplasm of skin: Secondary | ICD-10-CM | POA: Diagnosis not present

## 2021-06-22 DIAGNOSIS — D2262 Melanocytic nevi of left upper limb, including shoulder: Secondary | ICD-10-CM | POA: Diagnosis not present

## 2021-07-04 ENCOUNTER — Other Ambulatory Visit: Payer: Self-pay | Admitting: Family Medicine

## 2021-07-04 DIAGNOSIS — I10 Essential (primary) hypertension: Secondary | ICD-10-CM

## 2021-07-04 NOTE — Telephone Encounter (Signed)
Requested Prescriptions  Pending Prescriptions Disp Refills  . hydrochlorothiazide (HYDRODIURIL) 50 MG tablet [Pharmacy Med Name: HYDROCHLOROTHIAZIDE '50MG'$  TABLETS] 90 tablet 1    Sig: TAKE 1 TABLET BY MOUTH EVERY DAY     Cardiovascular: Diuretics - Thiazide Passed - 07/04/2021  3:46 PM      Passed - Ca in normal range and within 360 days    Calcium  Date Value Ref Range Status  04/28/2021 9.1 8.6 - 10.2 mg/dL Final         Passed - Cr in normal range and within 360 days    Creatinine, Ser  Date Value Ref Range Status  04/28/2021 0.79 0.76 - 1.27 mg/dL Final         Passed - K in normal range and within 360 days    Potassium  Date Value Ref Range Status  04/28/2021 3.6 3.5 - 5.2 mmol/L Final         Passed - Na in normal range and within 360 days    Sodium  Date Value Ref Range Status  04/28/2021 141 134 - 144 mmol/L Final         Passed - Last BP in normal range    BP Readings from Last 1 Encounters:  04/28/21 134/83         Passed - Valid encounter within last 6 months    Recent Outpatient Visits          2 months ago Annual physical exam   [facility] Birdie Sons, MD   6 months ago Right foot pain   [facility] Birdie Sons, MD   9 months ago Type 2 diabetes mellitus with other circulatory complication, without long-term current use of insulin [facility])   [facility] Birdie Sons, MD   9 months ago Upper respiratory tract infection, unspecified type   [facility] Birdie Sons, MD   1 year ago Impacted cerumen of right ear   [facility] [place], [place]      Future Appointments            In 1 month Fisher, Kirstie Peri, MD [facility] [place], [place]

## 2021-07-09 ENCOUNTER — Ambulatory Visit: Payer: Medicare PPO | Admitting: Family Medicine

## 2021-07-14 DIAGNOSIS — M4316 Spondylolisthesis, lumbar region: Secondary | ICD-10-CM | POA: Diagnosis not present

## 2021-07-14 DIAGNOSIS — Z6827 Body mass index (BMI) 27.0-27.9, adult: Secondary | ICD-10-CM | POA: Diagnosis not present

## 2021-08-04 ENCOUNTER — Ambulatory Visit: Payer: Medicare PPO | Admitting: Family Medicine

## 2021-08-18 ENCOUNTER — Ambulatory Visit: Payer: Medicare PPO | Admitting: Podiatry

## 2021-08-18 ENCOUNTER — Other Ambulatory Visit: Payer: Self-pay

## 2021-08-18 ENCOUNTER — Encounter: Payer: Self-pay | Admitting: Podiatry

## 2021-08-18 ENCOUNTER — Ambulatory Visit (INDEPENDENT_AMBULATORY_CARE_PROVIDER_SITE_OTHER): Payer: Medicare PPO

## 2021-08-18 ENCOUNTER — Other Ambulatory Visit: Payer: Self-pay | Admitting: Podiatry

## 2021-08-18 DIAGNOSIS — M2042 Other hammer toe(s) (acquired), left foot: Secondary | ICD-10-CM

## 2021-08-18 DIAGNOSIS — G5762 Lesion of plantar nerve, left lower limb: Secondary | ICD-10-CM | POA: Diagnosis not present

## 2021-08-18 DIAGNOSIS — G5782 Other specified mononeuropathies of left lower limb: Secondary | ICD-10-CM

## 2021-08-18 MED ORDER — TRIAMCINOLONE ACETONIDE 40 MG/ML IJ SUSP
20.0000 mg | Freq: Once | INTRAMUSCULAR | Status: AC
  Administered 2021-08-18: 20 mg

## 2021-08-18 NOTE — Progress Notes (Signed)
He presents today chief complaint of plaint pain to the plantar forefoot left.  States that hurts sort of under here as he points to the third fourth metatarsal head.  He states has been to bother me now for the past few months had previous neuroma surgery there by Dr. Caryl Comes several years ago the area has been numb since.  States that now through that numbness he started to experience pain.  Is having to take medication to sleep at night.  Objective: Vital signs are stable he is alert and oriented x3.  Pulses are palpable.  There is no erythema edema cellulitis drainage or odor that he does have a palpable Mulder's click to the third interdigital space of the left foot.  Radiographs taken do not demonstrate any type of osseous abnormalities in that area.  No acute findings are noted.  Assessment: Neuroma third interdigital space left foot.  Plan: I injected the area today for the stump neuroma with 10 mg of Kenalog 5 mg Marcaine point of maximal tenderness.  Follow-up with him in 1 month.

## 2021-08-19 ENCOUNTER — Other Ambulatory Visit: Payer: Self-pay | Admitting: Family Medicine

## 2021-08-19 NOTE — Telephone Encounter (Signed)
Requested Prescriptions  Pending Prescriptions Disp Refills  . omeprazole (PRILOSEC) 40 MG capsule [Pharmacy Med Name: OMEPRAZOLE 40MG  CAPSULES] 90 capsule 0    Sig: TAKE 1 CAPSULE(40 MG) BY MOUTH DAILY     Gastroenterology: Proton Pump Inhibitors Passed - 08/19/2021 10:10 AM      Passed - Valid encounter within last 12 months    Recent Outpatient Visits          3 months ago Annual physical exam   [facility] Birdie Sons, MD   7 months ago Right foot pain   [facility] Birdie Sons, MD   10 months ago Type 2 diabetes mellitus with other circulatory complication, without long-term current use of insulin [facility])   [facility] Birdie Sons, MD   11 months ago Upper respiratory tract infection, unspecified type   [facility] Birdie Sons, MD   1 year ago Impacted cerumen of right ear   [facility] [place], [place], [place]

## 2021-08-25 ENCOUNTER — Ambulatory Visit: Payer: Medicare PPO | Admitting: Family Medicine

## 2021-09-06 ENCOUNTER — Telehealth: Payer: Medicare PPO | Admitting: Physician Assistant

## 2021-09-06 DIAGNOSIS — J208 Acute bronchitis due to other specified organisms: Secondary | ICD-10-CM

## 2021-09-06 DIAGNOSIS — B9689 Other specified bacterial agents as the cause of diseases classified elsewhere: Secondary | ICD-10-CM | POA: Diagnosis not present

## 2021-09-06 MED ORDER — DOXYCYCLINE HYCLATE 100 MG PO TABS: 100.0000 mg | ORAL_TABLET | Freq: Two times a day (BID) | ORAL | 0 refills | Status: DC

## 2021-09-06 MED ORDER — BENZONATATE 100 MG PO CAPS: 100.0000 mg | ORAL_CAPSULE | Freq: Three times a day (TID) | ORAL | 0 refills | Status: DC | PRN

## 2021-09-06 NOTE — Progress Notes (Signed)

## 2021-09-15 DIAGNOSIS — E119 Type 2 diabetes mellitus without complications: Secondary | ICD-10-CM | POA: Diagnosis not present

## 2021-09-20 ENCOUNTER — Telehealth: Payer: Self-pay | Admitting: Family Medicine

## 2021-09-20 MED ORDER — EZETIMIBE 10 MG PO TABS: 10.0000 mg | ORAL_TABLET | Freq: Every day | ORAL | 3 refills | Status: DC

## 2021-09-20 NOTE — Telephone Encounter (Signed)
[facility] faxed refill request for the following medications:  ezetimibe (ZETIA) 10 MG tablet   Please advise.  

## 2021-09-27 ENCOUNTER — Ambulatory Visit: Payer: Medicare PPO | Admitting: Podiatry

## 2021-09-29 ENCOUNTER — Ambulatory Visit: Payer: Medicare PPO | Admitting: Podiatry

## 2021-09-29 ENCOUNTER — Encounter: Payer: Self-pay | Admitting: Podiatry

## 2021-09-29 ENCOUNTER — Other Ambulatory Visit: Payer: Self-pay

## 2021-09-29 DIAGNOSIS — G5782 Other specified mononeuropathies of left lower limb: Secondary | ICD-10-CM

## 2021-09-29 NOTE — Progress Notes (Signed)
He presents today for his follow-up of his neuroma after an injection third interdigital space left foot.  This is a stump neuroma where Dr. Caryl Comes had removed the neuroma initially.  He states that if I need to get back to surgery I am fine with that I just do not want to have this happen again.  Objective: Vital signs are stable he is alert and oriented x3.  He has pain on palpation to the third interdigital space distally.  There is a palpable Mulder's click.  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  Plan: We discussed pros and cons of surgery as well as the possible use of alcohol.  He would like to go ahead and try the dehydrated alcohol injection at this time, I expressed to him that we would not try it more than a few times before going on to surgery.

## 2021-10-04 DIAGNOSIS — M5416 Radiculopathy, lumbar region: Secondary | ICD-10-CM | POA: Diagnosis not present

## 2021-10-13 ENCOUNTER — Other Ambulatory Visit: Payer: Self-pay

## 2021-10-13 ENCOUNTER — Ambulatory Visit (INDEPENDENT_AMBULATORY_CARE_PROVIDER_SITE_OTHER): Payer: Medicare PPO

## 2021-10-13 DIAGNOSIS — Z23 Encounter for immunization: Secondary | ICD-10-CM | POA: Diagnosis not present

## 2021-10-25 ENCOUNTER — Other Ambulatory Visit: Payer: Self-pay | Admitting: Family Medicine

## 2021-10-25 DIAGNOSIS — E785 Hyperlipidemia, unspecified: Secondary | ICD-10-CM

## 2021-10-26 NOTE — Telephone Encounter (Signed)
Requested Prescriptions  Pending Prescriptions Disp Refills   lovastatin (MEVACOR) 40 MG tablet [Pharmacy Med Name: LOVASTATIN 40MG  TABLETS] 90 tablet 1    Sig: TAKE 1 TABLET BY MOUTH EVERY DAY     Cardiovascular:  Antilipid - Statins Failed - 10/25/2021  6:22 PM      Failed - LDL in normal range and within 360 days    LDL Chol Calc (NIH)  Date Value Ref Range Status  04/28/2021 103 (H) 0 - 99 mg/dL Final         Failed - HDL in normal range and within 360 days    HDL  Date Value Ref Range Status  04/28/2021 29 (L) >39 mg/dL Final         Failed - Triglycerides in normal range and within 360 days    Triglycerides  Date Value Ref Range Status  04/28/2021 182 (H) 0 - 149 mg/dL Final         Passed - Total Cholesterol in normal range and within 360 days    Cholesterol, Total  Date Value Ref Range Status  04/28/2021 164 100 - 199 mg/dL Final         Passed - Patient is not pregnant      Passed - Valid encounter within last 12 months    Recent Outpatient Visits          6 months ago Annual physical exam   [facility] Birdie Sons, MD   10 months ago Right foot pain   [facility] Birdie Sons, MD   1 year ago Type 2 diabetes mellitus with other circulatory complication, without long-term current use of insulin [facility])   [facility] Birdie Sons, MD   1 year ago Upper respiratory tract infection, unspecified type   [facility] Birdie Sons, MD   1 year ago Impacted cerumen of right ear   [place], [place], [place]

## 2021-10-27 ENCOUNTER — Other Ambulatory Visit: Payer: Self-pay

## 2021-10-27 ENCOUNTER — Ambulatory Visit: Payer: Medicare PPO | Admitting: Podiatry

## 2021-10-27 ENCOUNTER — Encounter: Payer: Self-pay | Admitting: Podiatry

## 2021-10-27 DIAGNOSIS — G5782 Other specified mononeuropathies of left lower limb: Secondary | ICD-10-CM

## 2021-10-27 NOTE — Progress Notes (Signed)
Presents today for follow-up of his neuroma third interdigital space left foot.  States that is good for a few days after the shot but then standing on it for a few hours it really gets to hurting.  Objective: Vital signs are stable alert oriented x3 there is no erythema edema cellulitis drainage or odor.  Palpable Mulder's click is noted.  Assessment neuroma third interdigital space.  Plan: Presents today for his second dehydrated alcohol injection of his left foot.  He tolerated procedure well without complications follow-up with him in 1 month

## 2021-11-15 ENCOUNTER — Telehealth: Payer: Medicare PPO | Admitting: Physician Assistant

## 2021-11-15 DIAGNOSIS — J069 Acute upper respiratory infection, unspecified: Secondary | ICD-10-CM | POA: Diagnosis not present

## 2021-11-15 MED ORDER — BENZONATATE 100 MG PO CAPS: 100.0000 mg | ORAL_CAPSULE | Freq: Three times a day (TID) | ORAL | 0 refills | Status: DC | PRN

## 2021-11-15 MED ORDER — IPRATROPIUM BROMIDE 0.03 % NA SOLN: 2.0000 | Freq: Two times a day (BID) | NASAL | 0 refills | Status: DC

## 2021-11-15 NOTE — Progress Notes (Signed)
E-Visit for Upper Respiratory Infection  ° °We are sorry you are not feeling well.  Here is how we plan to help! ° °Based on what you have shared with me, it looks like you may have a viral upper respiratory infection.  Upper respiratory infections are caused by a large number of viruses; however, rhinovirus is the most common cause.  ° °Symptoms vary from person to person, with common symptoms including sore throat, cough, fatigue or lack of energy and feeling of general discomfort.  A low-grade fever of up to 100.4 may present, but is often uncommon.  Symptoms vary however, and are closely related to a person's age or underlying illnesses.  The most common symptoms associated with an upper respiratory infection are nasal discharge or congestion, cough, sneezing, headache and pressure in the ears and face.  These symptoms usually persist for about 3 to 10 days, but can last up to 2 weeks.  It is important to know that upper respiratory infections do not cause serious illness or complications in most cases.   ° °Upper respiratory infections can be transmitted from person to person, with the most common method of transmission being a person's hands.  The virus is able to live on the skin and can infect other persons for up to 2 hours after direct contact.  Also, these can be transmitted when someone coughs or sneezes; thus, it is important to cover the mouth to reduce this risk.  To keep the spread of the illness at bay, good hand hygiene is very important. ° °This is an infection that is most likely caused by a virus. There are no specific treatments other than to help you with the symptoms until the infection runs its course.  We are sorry you are not feeling well.  Here is how we plan to help! ° ° °For nasal congestion, you may use an oral decongestants such as Mucinex D or if you have glaucoma or high blood pressure use plain Mucinex.  Saline nasal spray or nasal drops can help and can safely be used as often as  needed for congestion.  For your congestion, I have prescribed Ipratropium Bromide nasal spray 0.03% two sprays in each nostril 2-3 times a day ° °If you do not have a history of heart disease, hypertension, diabetes or thyroid disease, prostate/bladder issues or glaucoma, you may also use Sudafed to treat nasal congestion.  It is highly recommended that you consult with a pharmacist or your primary care physician to ensure this medication is safe for you to take.    ° °If you have a cough, you may use cough suppressants such as Delsym and Robitussin.  If you have glaucoma or high blood pressure, you can also use Coricidin HBP.   °For cough I have prescribed for you A prescription cough medication called Tessalon Perles 100 mg. You may take 1-2 capsules every 8 hours as needed for cough ° °If you have a sore or scratchy throat, use a saltwater gargle- ¼ to ½ teaspoon of salt dissolved in a 4-ounce to 8-ounce glass of warm water.  Gargle the solution for approximately 15-30 seconds and then spit.  It is important not to swallow the solution.  You can also use throat lozenges/cough drops and Chloraseptic spray to help with throat pain or discomfort.  Warm or cold liquids can also be helpful in relieving throat pain. ° °For headache, pain or general discomfort, you can use Ibuprofen or Tylenol as directed.   °  Some authorities believe that zinc sprays or the use of Echinacea may shorten the course of your symptoms.   HOME CARE Only take medications as instructed by your medical team. Be sure to drink plenty of fluids. Water is fine as well as fruit juices, sodas and electrolyte beverages. You may want to stay away from caffeine or alcohol. If you are nauseated, try taking small sips of liquids. How do you know if you are getting enough fluid? Your urine should be a pale yellow or almost colorless. Get rest. Taking a steamy shower or using a humidifier may help nasal congestion and ease sore throat pain. You can  place a towel over your head and breathe in the steam from hot water coming from a faucet. Using a saline nasal spray works much the same way. Cough drops, hard candies and sore throat lozenges may ease your cough. Avoid close contacts especially the very young and the elderly Cover your mouth if you cough or sneeze Always remember to wash your hands.   GET HELP RIGHT AWAY IF: You develop worsening fever. If your symptoms do not improve within 10 days You develop yellow or green discharge from your nose over 3 days. You have coughing fits You develop a severe head ache or visual changes. You develop shortness of breath, difficulty breathing or start having chest pain Your symptoms persist after you have completed your treatment plan  MAKE SURE YOU  Understand these instructions. Will watch your condition. Will get help right away if you are not doing well or get worse.  Thank you for choosing an e-visit.  Your e-visit answers were reviewed by a board certified advanced clinical practitioner to complete your personal care plan. Depending upon the condition, your plan could have included both over the counter or prescription medications.  Please review your pharmacy choice. Make sure the pharmacy is open so you can pick up prescription now. If there is a problem, you may contact your provider through [employer] and have the prescription routed to another pharmacy.  Your safety is important to [place]. If you have drug allergies check your prescription carefully.   For the next 24 hours you can use MyChart to ask questions about today's visit, request a non-urgent call back, or ask for a work or school excuse. You will get an email in the next two days asking about your experience. I hope that your e-visit has been valuable and will speed your recovery.

## 2021-11-15 NOTE — Progress Notes (Signed)
E-Visit for Corona Virus Screening  Your current symptoms could be consistent with the coronavirus.  Many health care providers can now test patients at their office but not all are.  [facility] has multiple testing sites. For information on our [place] testing locations and hours go to [email or web address]  We are enrolling you in our [place] for [place] . Daily you will receive a questionnaire within the [facility] website. Our COVID 19 response team will be monitoring your responses daily.  Testing Information: The COVID-19 Community Testing sites are testing BY APPOINTMENT ONLY.  You can schedule online at [email or web address]  If you do not have access to a smart phone or computer you may call [phone] for an appointment.   Additional testing sites in the Community:  For [facility] Testing sites in [place]  [email or web address]  For Pop-up testing sites in [place]  [email or web address]  For [facility] [email or web address]  For [facility] testing in [place] and [employer] [email or web address]  For [facility] testing in [place]   [email or web address]  For  more information about community testing call [phone]   Please quarantine yourself while awaiting your test results. Please stay home for a minimum of 10 days from the first day of illness with improving symptoms and you have had 24 hours of no fever (without the use of Tylenol (Acetaminophen) Motrin (Ibuprofen) or any fever reducing medication).  Also - Do not get tested prior to returning to work because once you have had a positive test the test can stay positive for  more than a month in some cases.   You should wear a mask or cloth face covering over your nose and mouth if you must be around other people or animals, including pets (even at home). Try to stay at least 6 feet away from other people. This will protect the people around you.  Please continue good preventive care measures, including:  frequent hand-washing, avoid touching your face, cover coughs/sneezes, stay out of crowds and keep a 6 foot distance from others.  COVID-19 is a respiratory illness with symptoms that are similar to the flu. Symptoms are typically mild to moderate, but there have been cases of severe illness and death due to the virus.   The following symptoms may appear 2-14 days after exposure: Fever Cough Shortness of breath or difficulty breathing Chills Repeated shaking with chills Muscle pain Headache Sore throat New loss of taste or smell Fatigue Congestion or runny nose Nausea or vomiting Diarrhea  Go to the nearest hospital ED for assessment if fever/cough/breathlessness are severe or illness seems like a threat to life.  It is vitally important that if you feel that you have an infection such as this virus or any other virus that you stay home and away from places where you may spread it to others.  You should avoid contact with people age 14 and older.   You can use medication such as prescription cough medication called Tessalon Perles 100 mg. You may take 1-2 capsules every 8 hours as needed for cough and Ipratropium Bromide nasal spray 1-2 sprays each nostril every 12 hours as needed for nasal congestion and drainage.   You may also take acetaminophen (Tylenol) as needed for fever.  Reduce your risk of any infection by using the same precautions used for avoiding the common cold or flu:  Wash your hands often with soap and warm water for at least 20 seconds.  If soap and water are not  readily available, use an alcohol-based hand sanitizer with at least 60%  alcohol.  If coughing or sneezing, cover your mouth and nose by coughing or sneezing into the elbow areas of your shirt or coat, into a tissue or into your sleeve (not your hands). Avoid shaking hands with others and consider head nods or verbal greetings only. Avoid touching your eyes, nose, or mouth with unwashed hands.  Avoid close contact with people who are sick. Avoid places or events with large numbers of people in one location, like concerts or sporting events. Carefully consider travel plans you have or are making. If you are planning any travel outside or inside the [place], visit the CDC's Travelers' Health webpage for the latest health notices. If you have some symptoms but not all symptoms, continue to monitor at home and seek medical attention if your symptoms worsen. If you are having a medical emergency, call 911.  HOME CARE Only take medications as instructed by your medical team. Drink plenty of fluids and get plenty of rest. A steam or ultrasonic humidifier can help if you have congestion.   GET HELP RIGHT AWAY IF YOU HAVE EMERGENCY WARNING SIGNS** FOR COVID-19. If you or someone is showing any of these signs seek emergency medical care immediately. Call 911 or proceed to your closest emergency facility if: You develop worsening high fever. Trouble breathing Bluish lips or face Persistent pain or pressure in the chest New confusion Inability to wake or stay awake You cough up blood. Your symptoms become more severe  **This list is not all possible symptoms. Contact your medical provider for any symptoms that are sever or concerning to you.  MAKE SURE YOU  Understand these instructions. Will watch your condition. Will get help right away if you are not doing well or get worse.  Your e-visit answers were reviewed by a board certified advanced clinical practitioner to complete your personal care plan.  Depending on the condition, your plan could have included both over the  counter or prescription medications.  If there is a problem please reply once you have received a response from your provider.  Your safety is important to [place].  If you have drug allergies check your prescription carefully.    You can use MyChart to ask questions about today's visit, request a non-urgent call back, or ask for a work or school excuse for 24 hours related to this e-Visit. If it has been greater than 24 hours you will need to follow up with your provider, or enter a new e-Visit to address those concerns. You will get an e-mail in the next two days asking about your experience.  I hope that your e-visit has been valuable and will speed your recovery. Thank you for using e-visits.  I provided 5 minutes of non face-to-face time during this encounter for chart review and documentation.  et

## 2021-11-23 ENCOUNTER — Other Ambulatory Visit: Payer: Self-pay | Admitting: Family Medicine

## 2021-11-23 ENCOUNTER — Telehealth: Payer: Medicare PPO | Admitting: Family Medicine

## 2021-11-23 DIAGNOSIS — B9689 Other specified bacterial agents as the cause of diseases classified elsewhere: Secondary | ICD-10-CM | POA: Diagnosis not present

## 2021-11-23 DIAGNOSIS — J019 Acute sinusitis, unspecified: Secondary | ICD-10-CM

## 2021-11-23 MED ORDER — BENZONATATE 100 MG PO CAPS: 100.0000 mg | ORAL_CAPSULE | Freq: Three times a day (TID) | ORAL | 0 refills | Status: DC | PRN

## 2021-11-23 MED ORDER — AMOXICILLIN-POT CLAVULANATE 875-125 MG PO TABS: 1.0000 | ORAL_TABLET | Freq: Two times a day (BID) | ORAL | 0 refills | Status: AC

## 2021-11-23 NOTE — Progress Notes (Signed)

## 2021-12-01 ENCOUNTER — Ambulatory Visit: Payer: Medicare PPO | Admitting: Podiatry

## 2021-12-08 ENCOUNTER — Ambulatory Visit: Payer: Medicare PPO | Admitting: Podiatry

## 2021-12-08 ENCOUNTER — Other Ambulatory Visit: Payer: Self-pay

## 2021-12-08 DIAGNOSIS — G5782 Other specified mononeuropathies of left lower limb: Secondary | ICD-10-CM | POA: Diagnosis not present

## 2021-12-08 MED ORDER — GABAPENTIN 100 MG PO CAPS: 100.0000 mg | ORAL_CAPSULE | Freq: Every day | ORAL | 3 refills | Status: DC

## 2021-12-08 NOTE — Progress Notes (Signed)
He presents today states that the second shot really did not do a whole lot he still having a lot of pain at nighttime.  Objective: Vital signs stable he is alert and oriented x3 palpable neuroma with palpable Mulder's click third interspace of the left foot.  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  Plan: Injected his third dose of dehydrated alcohol and increase his gabapentin for nighttime pain.  I expressed to him that he can take another gabapentin capsule or even up to 2 more gabapentin capsules to help reduce his nocturnal symptomatology.

## 2021-12-16 ENCOUNTER — Other Ambulatory Visit: Payer: Self-pay | Admitting: *Deleted

## 2021-12-16 DIAGNOSIS — Z87891 Personal history of nicotine dependence: Secondary | ICD-10-CM

## 2021-12-16 DIAGNOSIS — F17210 Nicotine dependence, cigarettes, uncomplicated: Secondary | ICD-10-CM

## 2021-12-28 ENCOUNTER — Ambulatory Visit
Admission: RE | Admit: 2021-12-28 | Discharge: 2021-12-28 | Disposition: A | Discharge status: Home / Self Care | Payer: Medicare PPO | Source: Ambulatory Visit | Attending: Acute Care | Admitting: Acute Care

## 2021-12-28 ENCOUNTER — Other Ambulatory Visit: Payer: Self-pay

## 2021-12-28 DIAGNOSIS — Z87891 Personal history of nicotine dependence: Secondary | ICD-10-CM | POA: Insufficient documentation

## 2021-12-28 DIAGNOSIS — F17210 Nicotine dependence, cigarettes, uncomplicated: Secondary | ICD-10-CM | POA: Diagnosis not present

## 2021-12-28 IMAGING — CT CT CHEST LUNG CANCER SCREENING LOW DOSE W/O CM
2 of 5 series · 15 of 40 positions shown, 18 images · non-contrast
Comparison: 11/20/2020

CLINICAL DATA: Asymptomatic current smoker with 52 pack-year
history.



[Series 3: lung 1.00 · axial · 0.75mm/px · z∈[-1265,-962]mm · 12 of 335 slices shown, 15 images]
[im 16/335  mediastinal]
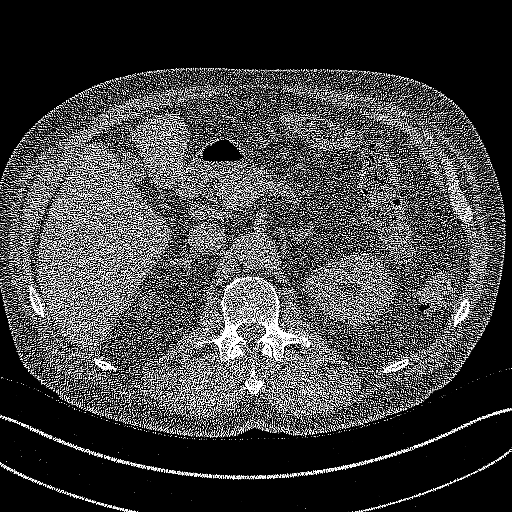
[im 16/335  lung]
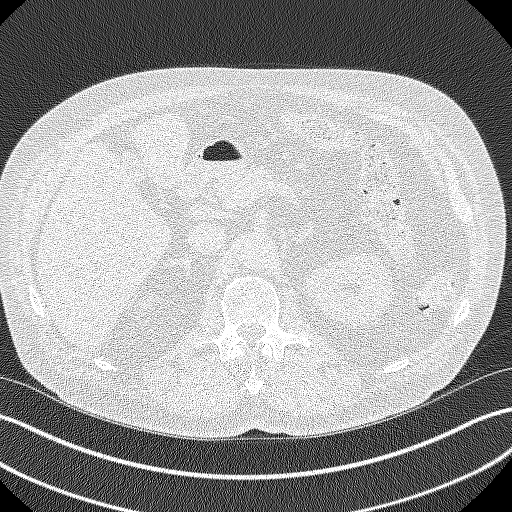
[im 46/335  lung]
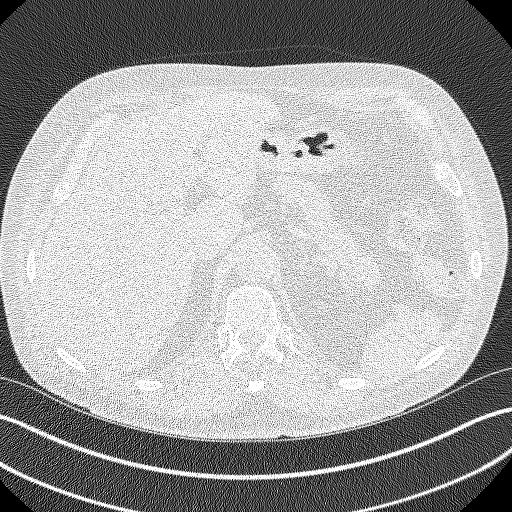
[im 76/335  lung]
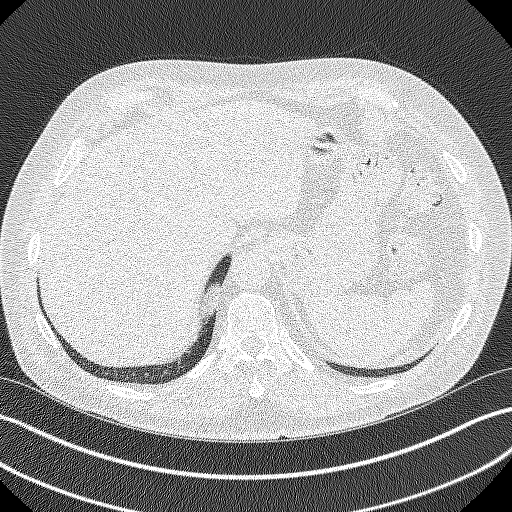
[im 107/335  lung]
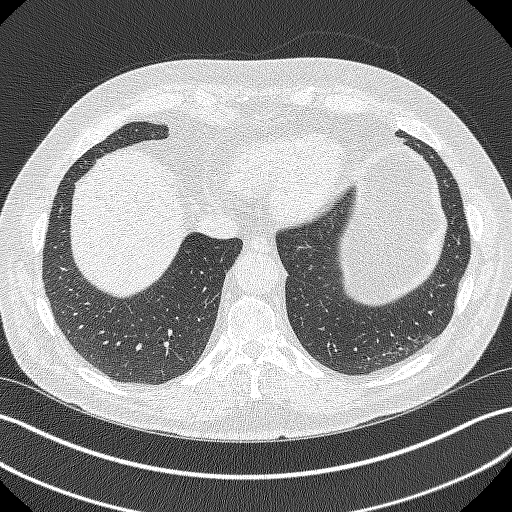
[im 122/335  mediastinal]
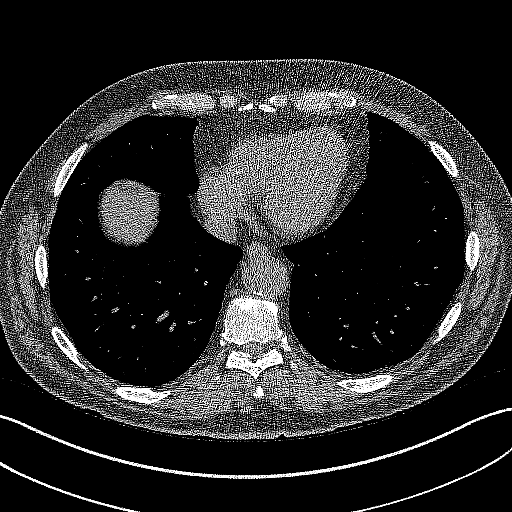
[im 122/335  lung]
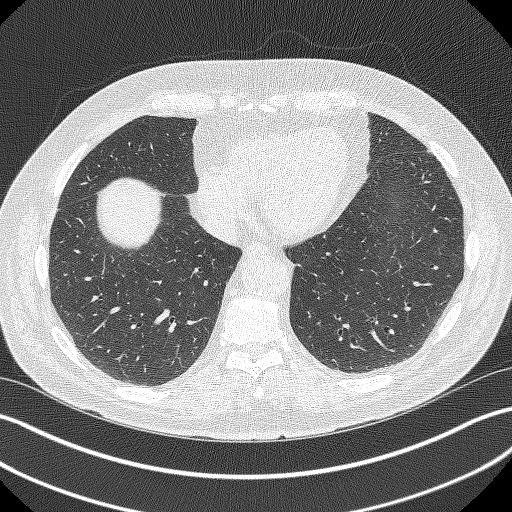
[im 152/335  lung]
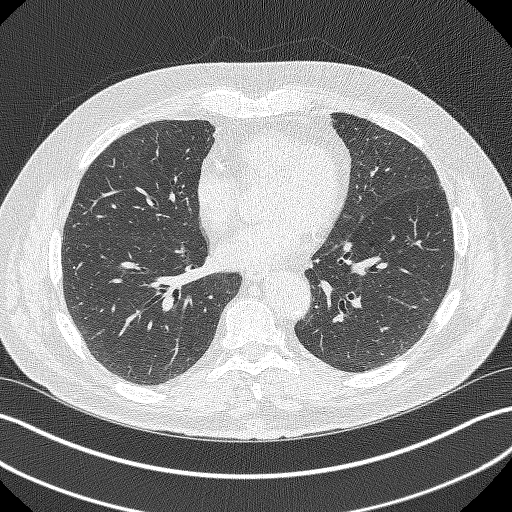
[im 183/335  lung]
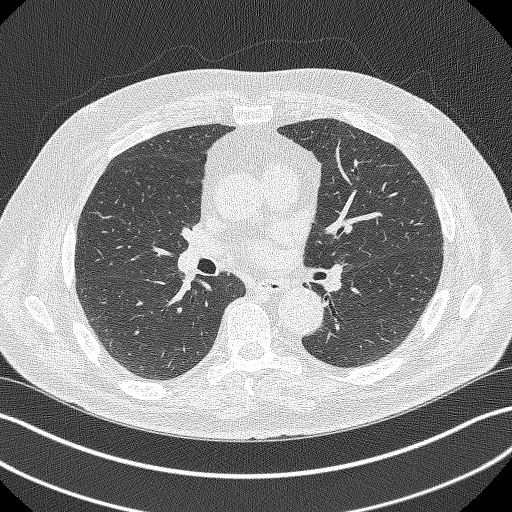
[im 213/335  lung]
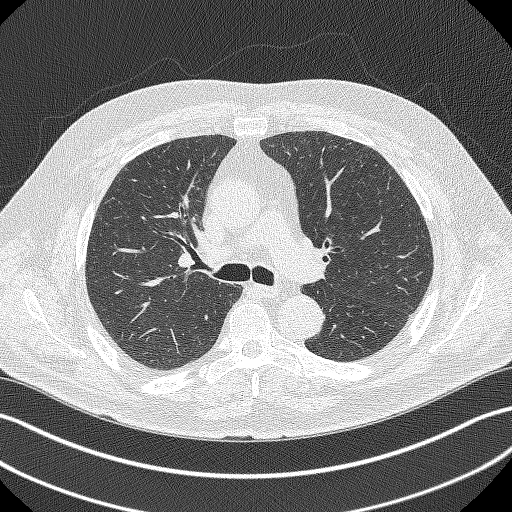
[im 228/335  mediastinal]
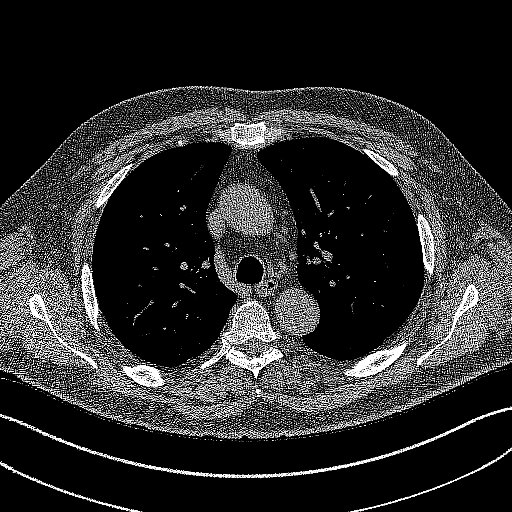
[im 228/335  lung]
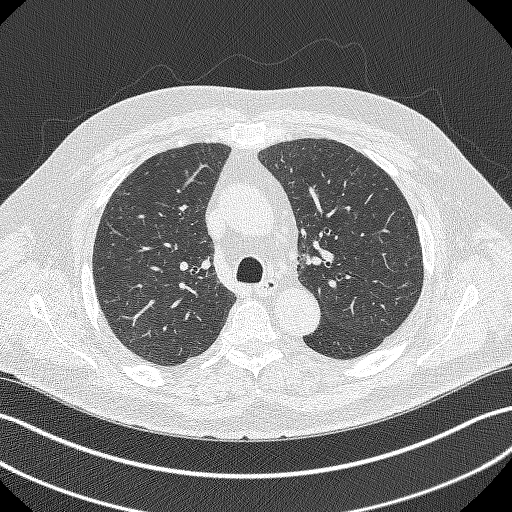
[im 259/335  lung]
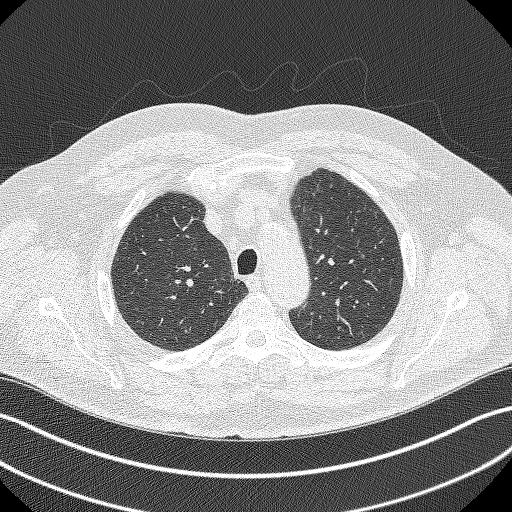
[im 289/335  lung]
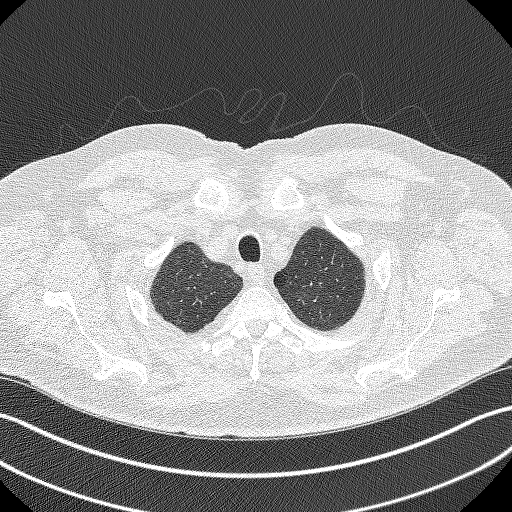
[im 319/335  lung]
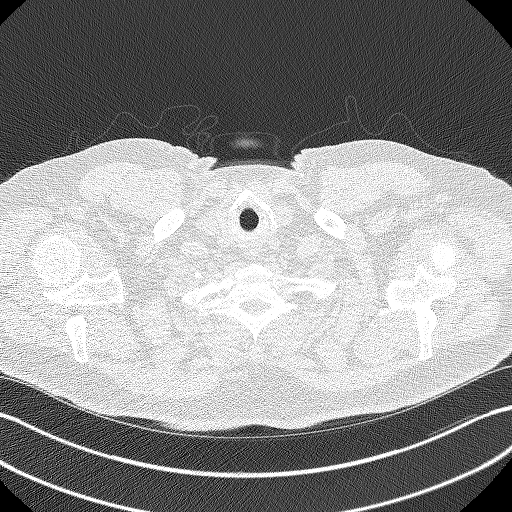

[Series 5: coronals lung 1.00 cor · coronal · 0.66mm/px · 3 of 286 slices shown]
[im 58/286  lung]
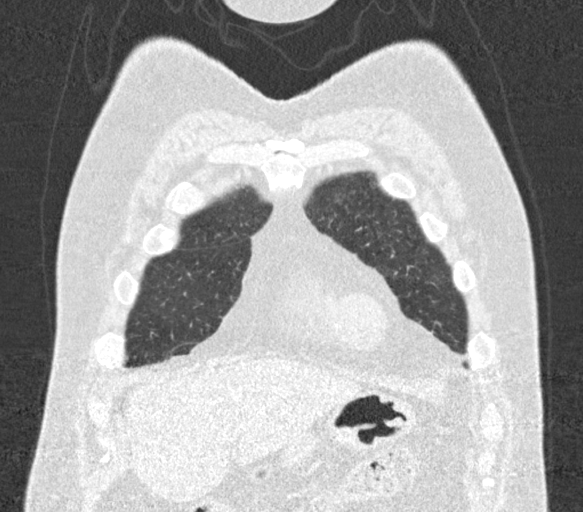
[im 115/286  lung]
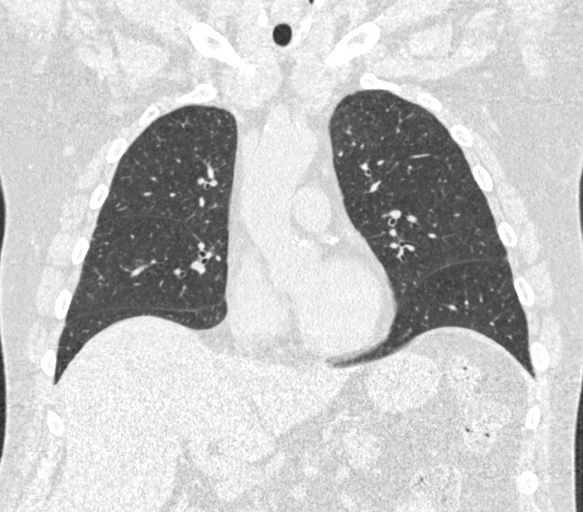
[im 172/286  lung]
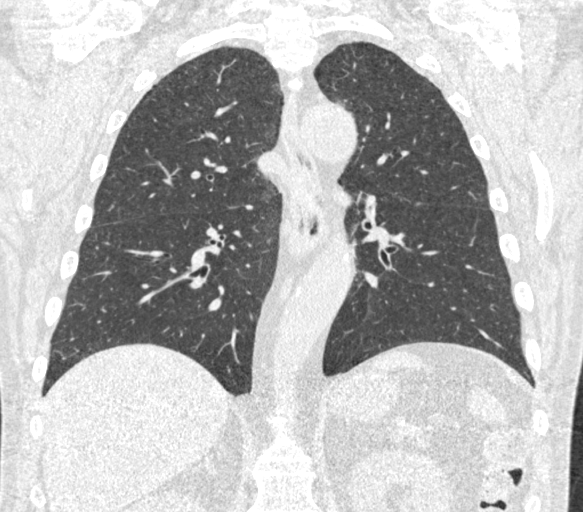

[15 of 40 positions shown; findings below may reference images not displayed]

FINDINGS: Cardiovascular: Aortic atherosclerosis. Tortuous thoracic aorta.
Normal heart size, without pericardial effusion. Left main and 3
vessel coronary artery calcification.

Mediastinum/Nodes: No mediastinal or definite hilar adenopathy,
given limitations of unenhanced CT.

Lungs/Pleura: No pleural fluid. Mild centrilobular and paraseptal
emphysema. Pulmonary nodules of maximally volume derived equivalent
diameter 3.6 mm, similar.

Upper Abdomen: Mild hepatic steatosis. Normal imaged portions of the
spleen, stomach, pancreas, gallbladder, adrenal glands, kidneys.

Musculoskeletal: No acute osseous abnormality. Nonspecific
skin/subcutaneous thickening about the posterior right chest wall on
[DATE] is present on the prior.
IMPRESSION: 1. Lung-RADS 2, benign appearance or behavior. Continue annual
screening with low-dose chest CT without contrast in 12 months.
2. Aortic Atherosclerosis (BC2V4-F9T.T) and Emphysema (BC2V4-TXI.1).
Coronary artery atherosclerosis.
3. Hepatic steatosis.

## 2021-12-30 ENCOUNTER — Other Ambulatory Visit: Payer: Self-pay | Admitting: Acute Care

## 2021-12-30 DIAGNOSIS — Z87891 Personal history of nicotine dependence: Secondary | ICD-10-CM

## 2021-12-30 DIAGNOSIS — F17210 Nicotine dependence, cigarettes, uncomplicated: Secondary | ICD-10-CM

## 2021-12-30 DIAGNOSIS — M5416 Radiculopathy, lumbar region: Secondary | ICD-10-CM | POA: Diagnosis not present

## 2022-01-04 ENCOUNTER — Other Ambulatory Visit: Payer: Self-pay | Admitting: Family Medicine

## 2022-01-04 DIAGNOSIS — I10 Essential (primary) hypertension: Secondary | ICD-10-CM

## 2022-01-05 ENCOUNTER — Ambulatory Visit: Payer: Medicare PPO | Admitting: Podiatry

## 2022-01-05 ENCOUNTER — Other Ambulatory Visit: Payer: Self-pay

## 2022-01-05 ENCOUNTER — Encounter: Payer: Self-pay | Admitting: Podiatry

## 2022-01-05 DIAGNOSIS — G5782 Other specified mononeuropathies of left lower limb: Secondary | ICD-10-CM | POA: Diagnosis not present

## 2022-01-05 NOTE — Progress Notes (Signed)
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  States that is doing much better he states that I would say it is probably 85 to 90% improved at this point. ? ?Objective: Vital signs are stable oriented x3.  Palpable Mulder's click still present with tenderness.  Pulses are palpable neurologic sensorium is otherwise unchanged. ? ?Assessment: Neuroma third interspace left foot.  Resolving at 85 to 90% ? ?Plan: Injected his fourth dose of dehydrated alcohol today.  He tolerated procedure well and this was done after alcohol skin prep.  Follow-up with him in 1 month if necessary ? ? ?

## 2022-01-16 ENCOUNTER — Telehealth: Payer: Medicare PPO | Admitting: Physician Assistant

## 2022-01-16 DIAGNOSIS — J329 Chronic sinusitis, unspecified: Secondary | ICD-10-CM | POA: Diagnosis not present

## 2022-01-17 ENCOUNTER — Other Ambulatory Visit (INDEPENDENT_AMBULATORY_CARE_PROVIDER_SITE_OTHER): Payer: Medicare PPO

## 2022-01-17 ENCOUNTER — Ambulatory Visit (INDEPENDENT_AMBULATORY_CARE_PROVIDER_SITE_OTHER): Payer: Medicare PPO | Admitting: Vascular Surgery

## 2022-01-17 MED ORDER — AMOXICILLIN-POT CLAVULANATE 875-125 MG PO TABS: 1.0000 | ORAL_TABLET | Freq: Two times a day (BID) | ORAL | 0 refills | Status: AC

## 2022-01-17 NOTE — Progress Notes (Signed)

## 2022-01-26 DIAGNOSIS — M48061 Spinal stenosis, lumbar region without neurogenic claudication: Secondary | ICD-10-CM | POA: Diagnosis not present

## 2022-01-26 DIAGNOSIS — M5416 Radiculopathy, lumbar region: Secondary | ICD-10-CM | POA: Diagnosis not present

## 2022-02-02 ENCOUNTER — Ambulatory Visit: Payer: Medicare PPO | Admitting: Podiatry

## 2022-02-07 DIAGNOSIS — K219 Gastro-esophageal reflux disease without esophagitis: Secondary | ICD-10-CM | POA: Diagnosis not present

## 2022-02-07 DIAGNOSIS — E785 Hyperlipidemia, unspecified: Secondary | ICD-10-CM | POA: Diagnosis not present

## 2022-02-07 DIAGNOSIS — Z809 Family history of malignant neoplasm, unspecified: Secondary | ICD-10-CM | POA: Diagnosis not present

## 2022-02-07 DIAGNOSIS — Z885 Allergy status to narcotic agent status: Secondary | ICD-10-CM | POA: Diagnosis not present

## 2022-02-07 DIAGNOSIS — F17210 Nicotine dependence, cigarettes, uncomplicated: Secondary | ICD-10-CM | POA: Diagnosis not present

## 2022-02-07 DIAGNOSIS — M4800 Spinal stenosis, site unspecified: Secondary | ICD-10-CM | POA: Diagnosis not present

## 2022-02-07 DIAGNOSIS — Z8249 Family history of ischemic heart disease and other diseases of the circulatory system: Secondary | ICD-10-CM | POA: Diagnosis not present

## 2022-02-07 DIAGNOSIS — I10 Essential (primary) hypertension: Secondary | ICD-10-CM | POA: Diagnosis not present

## 2022-02-14 ENCOUNTER — Ambulatory Visit: Payer: Medicare PPO | Admitting: Podiatry

## 2022-02-14 ENCOUNTER — Encounter: Payer: Self-pay | Admitting: Podiatry

## 2022-02-14 DIAGNOSIS — G5782 Other specified mononeuropathies of left lower limb: Secondary | ICD-10-CM

## 2022-02-14 NOTE — Progress Notes (Signed)
He presents today states that is not doing bad about 90% improved I think 1 more injection will do the trick. ? ?Objective: Vital signs are stable alert and oriented x3.  He has mild tenderness on palpation to the third interdigital space of the left foot. ? ?Assessment: Resolving neuroma 90% third interdigital space left foot. ? ?Plan: I injected his fifth dose of dehydrated alcohol to the third interdigital space of the left foot.  And I will follow-up with him in 1 month if necessary. ?

## 2022-03-07 DIAGNOSIS — Z6827 Body mass index (BMI) 27.0-27.9, adult: Secondary | ICD-10-CM | POA: Diagnosis not present

## 2022-03-07 DIAGNOSIS — M48061 Spinal stenosis, lumbar region without neurogenic claudication: Secondary | ICD-10-CM | POA: Diagnosis not present

## 2022-03-08 ENCOUNTER — Other Ambulatory Visit: Payer: Self-pay | Admitting: Neurological Surgery

## 2022-03-08 DIAGNOSIS — M48061 Spinal stenosis, lumbar region without neurogenic claudication: Secondary | ICD-10-CM

## 2022-03-16 ENCOUNTER — Ambulatory Visit
Admission: RE | Admit: 2022-03-16 | Discharge: 2022-03-16 | Disposition: A | Discharge status: Home / Self Care | Payer: Medicare PPO | Source: Ambulatory Visit | Attending: Neurological Surgery | Admitting: Neurological Surgery

## 2022-03-16 ENCOUNTER — Ambulatory Visit: Payer: Medicare PPO | Admitting: Podiatry

## 2022-03-16 DIAGNOSIS — M48061 Spinal stenosis, lumbar region without neurogenic claudication: Secondary | ICD-10-CM | POA: Diagnosis not present

## 2022-03-16 DIAGNOSIS — M545 Low back pain, unspecified: Secondary | ICD-10-CM | POA: Diagnosis not present

## 2022-03-23 DIAGNOSIS — M48061 Spinal stenosis, lumbar region without neurogenic claudication: Secondary | ICD-10-CM | POA: Diagnosis not present

## 2022-03-23 DIAGNOSIS — Z6827 Body mass index (BMI) 27.0-27.9, adult: Secondary | ICD-10-CM | POA: Diagnosis not present

## 2022-03-28 ENCOUNTER — Telehealth (INDEPENDENT_AMBULATORY_CARE_PROVIDER_SITE_OTHER): Payer: Self-pay | Admitting: Vascular Surgery

## 2022-03-29 ENCOUNTER — Telehealth (INDEPENDENT_AMBULATORY_CARE_PROVIDER_SITE_OTHER): Payer: Self-pay | Admitting: Vascular Surgery

## 2022-03-29 NOTE — Telephone Encounter (Signed)
error 

## 2022-03-29 NOTE — Telephone Encounter (Signed)
Spoke with pt regarding making appt for referral. Pt states that he does not want to see Dr. Delana Meyer but wold like to see Dr. Lucky Cowboy. I spoke with Dr. Delana Meyer and he ok'ed it. I spoke with Dr. Lucky Cowboy this morning and he is fine with the switch.   LVM for pt TCB and schedule an appt with Dr. Lucky Cowboy ONLY.   ls 3.21.22 GS - aaa + consult. MR [place] spine wo contrast performed - found aaa to be up to 4.1 cm. increased from prior. referred by dawley,[place]. SEE JD ONLY.

## 2022-04-11 DIAGNOSIS — M5416 Radiculopathy, lumbar region: Secondary | ICD-10-CM | POA: Diagnosis not present

## 2022-04-21 ENCOUNTER — Telehealth: Payer: Self-pay | Admitting: Family Medicine

## 2022-04-21 NOTE — Telephone Encounter (Signed)
Copied from [place]. Topic: Medicare AWV >> Apr 21, 2022  9:53 AM Jae Dire wrote: Reason for CRM:  Left message for patient to call back and schedule Medicare Annual Wellness Visit (AWV) in office.   If unable to come into the office for AWV,  please offer to do virtually or by telephone.  Last AWV: 04/28/2021  Please schedule at anytime with [facility] Health Advisor.  30 minute appointment for Virtual or phone 45 minute appointment for in office or Initial virtual/phone  Any questions, please contact me at [phone]

## 2022-04-28 ENCOUNTER — Other Ambulatory Visit: Payer: Self-pay | Admitting: Family Medicine

## 2022-04-28 DIAGNOSIS — E785 Hyperlipidemia, unspecified: Secondary | ICD-10-CM

## 2022-04-29 ENCOUNTER — Encounter: Payer: Self-pay | Admitting: Family Medicine

## 2022-05-02 DIAGNOSIS — I714 Abdominal aortic aneurysm, without rupture, unspecified: Secondary | ICD-10-CM | POA: Insufficient documentation

## 2022-05-03 ENCOUNTER — Ambulatory Visit (INDEPENDENT_AMBULATORY_CARE_PROVIDER_SITE_OTHER): Payer: Medicare PPO

## 2022-05-03 ENCOUNTER — Encounter: Payer: Self-pay | Admitting: Vascular Surgery

## 2022-05-03 ENCOUNTER — Ambulatory Visit: Payer: Medicare PPO | Admitting: Vascular Surgery

## 2022-05-03 VITALS — BP 138/79 | HR 66 | Temp 97.7°F | Resp 16 | Ht 70.0 in | Wt 182.0 lb

## 2022-05-03 VITALS — Wt 182.0 lb

## 2022-05-03 DIAGNOSIS — Z0000 Encounter for general adult medical examination without abnormal findings: Secondary | ICD-10-CM | POA: Diagnosis not present

## 2022-05-03 DIAGNOSIS — I7143 Infrarenal abdominal aortic aneurysm, without rupture: Secondary | ICD-10-CM

## 2022-05-03 NOTE — Progress Notes (Signed)
Virtual Visit via Telephone Note  I connected with  [facility] on 05/03/22 at  1:30 PM EDT by telephone and verified that I am speaking with the correct person using two identifiers.  Location: Patient: home Provider: [facility] Persons participating in the virtual visit: [place]   I discussed the limitations, risks, security and privacy concerns of performing an evaluation and management service by telephone and the availability of in person appointments. The patient expressed understanding and agreed to proceed.  Interactive audio and video telecommunications were attempted between this nurse and patient, however failed, due to patient having technical difficulties OR patient did not have access to video capability.  We continued and completed visit with audio only.  Some vital signs may be absent or patient reported.   Dionisio David, LPN  Subjective:   Thomas Morgan is a 71 y.o. male who presents for Medicare Annual/Subsequent preventive examination.  Review of Systems     Cardiac Risk Factors include: advanced age (>48mn, >>64women);dyslipidemia;hypertension;male gender     Objective:    There were no vitals filed for this visit. There is no height or weight on file to calculate BMI.     05/03/2022    1:29 PM 04/21/2020    8:29 AM 04/02/2019    4:25 PM 04/02/2019    2:19 PM 12/22/2017    1:19 PM 03/16/2017    1:31 PM  Advanced Directives  Does Patient Have a Medical Advance Directive? Yes Yes Yes Yes Yes Yes  Type of AParamedicof ABrickervilleLiving will HO[place]Living will HAmadoLiving will HJordan HillLiving will HQuitmanLiving will HMuldraughLiving will  Does patient want to make changes to medical advance directive? Yes (Inpatient - patient defers changing a medical advance directive and declines information at this time)     No -  Patient declined  Copy of [place]ock[place] Mountainin Chart? Yes - validated most recent copy scanned in chart (See row information) No - copy requested No - copy requested No - copy requested No - copy requested   Would patient like information on creating a medical advance directive?  No - Patient declined        Current Medications (verified) Outpatient Encounter Medications as of 05/03/2022  Medication Sig   aspirin 81 MG EC tablet Take 81 mg by mouth daily.    ezetimibe (ZETIA) 10 MG tablet Take 1 tablet (10 mg total) by mouth daily.   glucose blood (ONETOUCH VERIO) test strip TEST ONCE DAILY  Dx: E11.9   hydrochlorothiazide (HYDRODIURIL) 50 MG tablet TAKE 1 TABLET BY MOUTH EVERY DAY   lovastatin (MEVACOR) 40 MG tablet TAKE 1 TABLET BY MOUTH EVERY DAY   omeprazole (PRILOSEC) 40 MG capsule TAKE 1 CAPSULE(40 MG) BY MOUTH DAILY   OneTouch Delica Lancets 391MMISC Use to check blood sugar once a day.  DX: E11.9   traMADol (ULTRAM) 50 MG tablet Take 1-2 tablets by mouth every 6 (six) hours as needed.   acetaminophen-codeine (TYLENOL #3) 300-30 MG tablet Take 1 tablet by mouth every 6 (six) hours as needed. (Patient not taking: Reported on 05/03/2022)   Cholecalciferol 25 MCG (1000 UT) capsule Take 1,000 Units by mouth daily. (Patient not taking: Reported on 05/03/2022)   gabapentin (NEURONTIN) 100 MG capsule Take 1 capsule (100 mg total) by mouth at bedtime. (Patient not taking: Reported on 05/03/2022)   No facility-administered encounter  medications on file as of 05/03/2022.    Allergies (verified) Hydrocodone, Atorvastatin, Crestor [rosuvastatin], and Lescol  [fluvastatin]   History: Past Medical History:  Diagnosis Date   Basal cell carcinoma    Complication of anesthesia    Personal history of tobacco use, presenting hazards to health 10/22/2015   PONV (postoperative nausea and vomiting)    gets very sick from anesthesia   Spinal stenosis    Past Surgical History:  Procedure  Laterality Date   BASAL CELL CARCINOMA EXCISION Left 2020   left arm   CYST REMOVAL TRUNK  2020   on his back   [place] Left 03/24/2017   Procedure: [place];  Surgeon: Sharlotte Alamo, DPM;  Location: [facility] ORS;  Service: Podiatry;  Laterality: Left;   FOOT SURGERY Right 04/16/2021   SKIN CANCER EXCISION     WRIST SURGERY Right    Trauma resulting in prosthetics in the right wrist and elbow   Family History  Problem Relation Age of Onset   Cancer Mother        Lung   Hypertension Mother    Heart Problems Father    AAA (abdominal aortic aneurysm) Father    Heart attack Brother    Hypercholesterolemia Brother    Social History   Socioeconomic History   Marital status: Married    Spouse name: Not on file   Number of children: 2   Years of education: Not on file   Highest education level: Bachelor's degree (e.g., BA, AB, BS)  Occupational History   Not on file  Tobacco Use   Smoking status: Every Day    Packs/day: 1.00    Years: 48.00    Total pack years: 48.00    Types: Cigarettes   Smokeless tobacco: Never  Vaping Use   Vaping Use: Never used  Substance and Sexual Activity   Alcohol use: No    Alcohol/week: 0.0 standard drinks of alcohol   Drug use: No   Sexual activity: Not on file  Other Topics Concern   Not on file  Social History Narrative   Not on file   Social Determinants of Health   Financial Resource Strain: Low Risk  (05/03/2022)   Overall Financial Resource Strain (CARDIA)    Difficulty of Paying Living Expenses: Not hard at all  Food Insecurity: No Food Insecurity (05/03/2022)   Hunger Vital Sign    Worried About Running Out of Food in the Last Year: Never true    Ran Out of Food in the Last Year: Never true  Transportation Needs: No Transportation Needs (05/03/2022)   PRAPARE - Hydrologist (Medical): No    Lack of Transportation (Non-Medical): No  Physical Activity:  Insufficiently Active (05/03/2022)   Exercise Vital Sign    Days of Exercise per Week: 3 days    Minutes of Exercise per Session: 30 min  Stress: No Stress Concern Present (05/03/2022)   [place]    Feeling of Stress : Not at all  Social Connections: Moderately Integrated (05/03/2022)   Social Connection and Isolation Panel [NHANES]    Frequency of Communication with Friends and Family: Twice a week    Frequency of Social Gatherings with Friends and Family: Three times a week    Attends Religious Services: More than 4 times per year    Active Member of Clubs or Organizations: No  Attends Archivist Meetings: Never    Marital Status: Married    Tobacco Counseling Ready to quit: Not Answered Counseling given: Not Answered   Clinical Intake:  Pre-visit preparation completed: Yes  Pain : No/denies pain     Nutritional Risks: None Diabetes: Yes CBG done?: No Did pt. bring in CBG monitor from home?: No  How often do you need to have someone help you when you read instructions, pamphlets, or other written materials from your doctor or pharmacy?: 1 - Never  Diabetic?yes Nutrition Risk Assessment:  Has the patient had any N/V/D within the last 2 months?  No  Does the patient have any non-healing wounds?  No  Has the patient had any unintentional weight loss or weight gain?  No   Diabetes:  Is the patient diabetic?  Yes  If diabetic, was a CBG obtained today?  No  Did the patient bring in their glucometer from home?  No  How often do you monitor your CBG's? occasionally.   Financial Strains and Diabetes Management:  Are you having any financial strains with the device, your supplies or your medication? No .  Does the patient want to be seen by Chronic Care Management for management of their diabetes?  No  Would the patient like to be referred to a Nutritionist or for Diabetic Management?  No    Diabetic Exams:  Diabetic Eye Exam: Completed 06/16/20. Overdue for diabetic eye exam. .  Diabetic Foot Exam: Completed 12/23/20. Pt has been advised about the importance in completing this exam. Dr.Hyatt    Interpreter Needed?: No  Information entered by :: Kirke Shaggy, LPN   Activities of Daily Living    05/03/2022    1:30 PM 04/29/2022    8:43 AM  In your present state of health, do you have any difficulty performing the following activities:  Hearing? 0 0  Vision? 0 0  Difficulty concentrating or making decisions? 0 0  Walking or climbing stairs? 1 1  Dressing or bathing? 0 0  Doing errands, shopping? 0 0  Preparing Food and eating ? N N  Using the Toilet? N N  In the past six months, have you accidently leaked urine? Y Y  Do you have problems with loss of bowel control? N N  Managing your Medications? N N  Managing your Finances? N N  Housekeeping or managing your Housekeeping? N N    Patient Care Team: Birdie Sons, MD as PCP - General (Family Medicine) Vladimir Crofts, MD as Consulting Physician (Neurology) Erline Levine, MD as Consulting Physician (Neurosurgery)  Indicate any recent Medical Services you may have received from other than [facility] providers in the past year (date may be approximate).     Assessment:   This is a routine wellness examination for Bram.  Hearing/Vision screen Hearing Screening - Comments:: No aids Vision Screening - Comments:: Readers- [facility]  Dietary issues and exercise activities discussed: Current Exercise Habits: Home exercise routine, Type of exercise: walking, Time (Minutes): 30, Frequency (Times/Week): 3, Weekly Exercise (Minutes/Week): 90, Intensity: Mild   Goals Addressed             This Visit's Progress    DIET - EAT MORE FRUITS AND VEGETABLES         Depression Screen    05/03/2022    1:27 PM 04/28/2021   10:07 AM 04/21/2020    8:26 AM 04/10/2020    9:57 AM 04/02/2019    4:26 PM 04/02/2019  2:19 PM  10/30/2018   11:50 AM  PHQ 2/9 Scores  PHQ - 2 Score 0 0 0 0 0 0 0  PHQ- 9 Score 0 2         Fall Risk    05/03/2022    1:29 PM 04/29/2022    8:43 AM 04/28/2021   10:07 AM 04/21/2020    8:29 AM 04/10/2020    9:57 AM  Fall Risk   Falls in the past year? 0 0 0 0 0  Number falls in past yr: 0  0 0 0  Injury with Fall? 0  0 0 0  Risk for fall due to : No Fall Risks  No Fall Risks    Follow up Falls evaluation completed  Falls evaluation completed      FALL RISK PREVENTION PERTAINING TO THE HOME:  Any stairs in or around the home? No  If so, are there any without handrails? No  Home free of loose throw rugs in walkways, pet beds, electrical cords, etc? Yes  Adequate lighting in your home to reduce risk of falls? Yes   ASSISTIVE DEVICES UTILIZED TO PREVENT FALLS:  Life alert? No  Use of a cane, walker or w/c? No  Grab bars in the bathroom? No  Shower chair or bench in shower? Yes  Elevated toilet seat or a handicapped toilet? No   Cognitive Function:        05/03/2022    1:33 PM 04/28/2021   10:05 AM 04/02/2019    4:30 PM 04/02/2019    2:24 PM  6CIT Screen  What Year? 0 points 0 points 0 points 0 points  What month? 0 points 0 points 0 points 0 points  What time? 0 points 0 points 0 points 0 points  Count back from 20 0 points 0 points 0 points 0 points  Months in reverse 0 points 0 points 0 points 0 points  Repeat phrase 0 points 2 points 2 points 2 points  Total Score 0 points 2 points 2 points 2 points    Immunizations Immunization History  Administered Date(s) Administered   Fluad Quad(high Dose 65+) 08/13/2019, 10/05/2020, 10/13/2021, 10/13/2021   Influenza, High Dose Seasonal PF 10/08/2015, 07/05/2016, 07/27/2018   Influenza-Unspecified 12/01/2017   PFIZER(Purple Top)SARS-COV-2 Vaccination 12/26/2019, 01/16/2020   Pneumococcal Conjugate-13 10/08/2015   Pneumococcal Polysaccharide-23 09/10/2008, 12/12/2016   Td 10/24/1997   Tdap 06/06/2007    TDAP status: Due,  Education has been provided regarding the importance of this vaccine. Advised may receive this vaccine at local pharmacy or Health Dept. Aware to provide a copy of the vaccination record if obtained from local pharmacy or Health Dept. Verbalized acceptance and understanding.  Flu Vaccine status: Up to date  Pneumococcal vaccine status: Up to date  Covid-19 vaccine status: Completed vaccines  Qualifies for Shingles Vaccine? Yes   Zostavax completed No   Shingrix Completed?: No.    Education has been provided regarding the importance of this vaccine. Patient has been advised to call insurance company to determine out of pocket expense if they have not yet received this vaccine. Advised may also receive vaccine at local pharmacy or Health Dept. Verbalized acceptance and understanding.  Screening Tests Health Maintenance  Topic Date Due   Zoster Vaccines- Shingrix (1 of 2) Never done   COVID-19 Vaccine (3 - Pfizer risk series) 02/13/2020   OPHTHALMOLOGY EXAM  06/16/2021   HEMOGLOBIN A1C  10/29/2021   COLONOSCOPY (Pts 45-63yr Insurance coverage will need to be confirmed)  12/19/2021   FOOT EXAM  12/23/2021   Diabetic kidney evaluation - GFR measurement  04/28/2022   Diabetic kidney evaluation - Urine ACR  04/28/2022   TETANUS/TDAP  08/12/2024 (Originally 06/05/2017)   INFLUENZA VACCINE  05/24/2022   Pneumonia Vaccine 87+ Years old  Completed   Hepatitis C Screening  Completed   HPV VACCINES  Aged Out    Health Maintenance  Health Maintenance Due  Topic Date Due   Zoster Vaccines- Shingrix (1 of 2) Never done   COVID-19 Vaccine (3 - Pfizer risk series) 02/13/2020   OPHTHALMOLOGY EXAM  06/16/2021   HEMOGLOBIN A1C  10/29/2021   COLONOSCOPY (Pts 45-75yr Insurance coverage will need to be confirmed)  12/19/2021   FOOT EXAM  12/23/2021   Diabetic kidney evaluation - GFR measurement  04/28/2022   Diabetic kidney evaluation - Urine ACR  04/28/2022    Colorectal cancer screening: Type  of screening: Colonoscopy. Completed 12/19/14. Repeat every 7 years- declined referral  Lung Cancer Screening: (Low Dose CT Chest recommended if Age 72-80years, 30 pack-year currently smoking OR have quit w/in 15years.) does qualify.   Lung Cancer Screening Referral: had one a couple months ago  Additional Screening:  Hepatitis C Screening: does qualify; Completed 10/08/15  Vision Screening: Recommended annual ophthalmology exams for early detection of glaucoma and other disorders of the eye. Is the patient up to date with their annual eye exam?  Yes  Who is the provider or what is the name of the office in which the patient attends annual eye exams? [facility] pt is not established with a provider, would they like to be referred to a provider to establish care? No .   Dental Screening: Recommended annual dental exams for proper oral hygiene  Community Resource Referral / Chronic Care Management: CRR required this visit?  No   CCM required this visit?  No      Plan:     I have personally reviewed and noted the following in the patient's chart:   Medical and social history Use of alcohol, tobacco or illicit drugs  Current medications and supplements including opioid prescriptions. Patient is not currently taking opioid prescriptions. Functional ability and status Nutritional status Physical activity Advanced directives List of other physicians Hospitalizations, surgeries, and ER visits in previous 12 months Vitals Screenings to include cognitive, depression, and falls Referrals and appointments  In addition, I have reviewed and discussed with patient certain preventive protocols, quality metrics, and best practice recommendations. A written personalized care plan for preventive services as well as general preventive health recommendations were provided to patient.     LDionisio David LPN   [id number]  Nurse Notes: none

## 2022-05-03 NOTE — Progress Notes (Signed)
Patient name: BASIM BARTNIK MRN: [id number] DOB: 11-03-1950 Sex: male  REASON FOR CONSULT: Evaluate 4.1 cm abdominal aortic aneurysm  HPI: RAMELO OETKEN is a 71 y.o. male, with history of tobacco abuse and spinal stenosis that presents for evaluation of a 4.1 cm abdominal aortic aneurysm.  Patient is followed by Dr. 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  He most recently underwent an MRI on 03/16/2022 showing a 4.1 cm abdominal aortic aneurysm increased by 6 mm since 2021.  He has no new abdominal pain today.  States his father died of a ruptured aneurysm at age 81.  He does smoke about a pack a day.  Wife states very active fishing and working in the yard.  Past Medical History:  Diagnosis Date   Basal cell carcinoma    Complication of anesthesia    Personal history of tobacco use, presenting hazards to health 10/22/2015   PONV (postoperative nausea and vomiting)    gets very sick from anesthesia   Spinal stenosis     Past Surgical History:  Procedure Laterality Date   BASAL CELL CARCINOMA EXCISION Left 2020   left arm   CYST REMOVAL TRUNK  2020   on his back   [place] Left 03/24/2017   Procedure: EXCISION [facility]'S NEUROMA;  Surgeon: Sharlotte Alamo, DPM;  Location: [facility] ORS;  Service: Podiatry;  Laterality: Left;   FOOT SURGERY Right 04/16/2021   SKIN CANCER EXCISION     WRIST SURGERY Right    Trauma resulting in prosthetics in the right wrist and elbow    Family History  Problem Relation Age of Onset   Cancer Mother        Lung   Hypertension Mother    Heart Problems Father    AAA (abdominal aortic aneurysm) Father    Heart attack Brother    Hypercholesterolemia Brother     SOCIAL HISTORY: Social History   Socioeconomic History   Marital status: Married    Spouse name: Not on file   Number of children: 2   Years of education: Not on file   Highest  education level: Bachelor's degree (e.g., BA, AB, BS)  Occupational History   Not on file  Tobacco Use   Smoking status: Every Day    Packs/day: 1.00    Years: 48.00    Total pack years: 48.00    Types: Cigarettes   Smokeless tobacco: Never  Vaping Use   Vaping Use: Never used  Substance and Sexual Activity   Alcohol use: No    Alcohol/week: 0.0 standard drinks of alcohol   Drug use: No   Sexual activity: Not on file  Other Topics Concern   Not on file  Social History Narrative   Not on file   Social Determinants of Health   Financial Resource Strain: Low Risk  (04/21/2020)   Overall Financial Resource Strain (CARDIA)    Difficulty of Paying Living Expenses: Not hard at all  Food Insecurity: No Food Insecurity (04/21/2020)   Hunger Vital Sign    Worried About Running Out of Food in the Last Year: Never true    Ran Out of Food in the Last Year: Never true  Transportation Needs: No Transportation Needs (04/21/2020)   PRAPARE - Hydrologist (Medical): No    Lack of Transportation (Non-Medical): No  Physical Activity: Inactive (04/21/2020)   Exercise  Vital Sign    Days of Exercise per Week: 0 days    Minutes of Exercise per Session: 0 min  Stress: No Stress Concern Present (04/21/2020)   [place]    Feeling of Stress : Not at all  Social Connections: Moderately Integrated (04/21/2020)   Social Connection and Isolation Panel [NHANES]    Frequency of Communication with Friends and Family: More than three times a week    Frequency of Social Gatherings with Friends and Family: More than three times a week    Attends Religious Services: More than 4 times per year    Active Member of [employer] or Organizations: No    Attends Archivist Meetings: Never    Marital Status: Married  Human resources officer Violence: Not At Risk (04/21/2020)   Humiliation, Afraid, Rape, and Kick questionnaire     Fear of Current or Ex-Partner: No    Emotionally Abused: No    Physically Abused: No    Sexually Abused: No    Allergies  Allergen Reactions   Hydrocodone Other (See Comments)    Palpitations, increased heart rate   Atorvastatin     Aching and feeling down   Crestor [Rosuvastatin] Other (See Comments)    Muscle aches   Lescol  [Fluvastatin]     Muscle Aches    Current Outpatient Medications  Medication Sig Dispense Refill   acetaminophen-codeine (TYLENOL #3) 300-30 MG tablet Take 1 tablet by mouth every 6 (six) hours as needed.     aspirin 81 MG EC tablet Take 81 mg by mouth daily.      Cholecalciferol 25 MCG (1000 UT) capsule Take 1,000 Units by mouth daily.     ezetimibe (ZETIA) 10 MG tablet Take 1 tablet (10 mg total) by mouth daily. 90 tablet 3   glucose blood (ONETOUCH VERIO) test strip TEST ONCE DAILY  Dx: E11.9 100 each 3   hydrochlorothiazide (HYDRODIURIL) 50 MG tablet TAKE 1 TABLET BY MOUTH EVERY DAY 90 tablet 1   lovastatin (MEVACOR) 40 MG tablet TAKE 1 TABLET BY MOUTH EVERY DAY 90 tablet 0   omeprazole (PRILOSEC) 40 MG capsule TAKE 1 CAPSULE(40 MG) BY MOUTH DAILY 90 capsule 4   OneTouch Delica Lancets 80H MISC Use to check blood sugar once a day.  DX: E11.9 100 each 3   traMADol (ULTRAM) 50 MG tablet Take 1-2 tablets by mouth every 6 (six) hours as needed.     gabapentin (NEURONTIN) 100 MG capsule Take 1 capsule (100 mg total) by mouth at bedtime. (Patient not taking: Reported on 05/03/2022) 90 capsule 3   No current facility-administered medications for this visit.    REVIEW OF SYSTEMS:  '[X]'$  denotes positive finding, '[ ]'$  denotes negative finding Cardiac  Comments:  Chest pain or chest pressure:    Shortness of breath upon exertion:    Short of breath when lying flat:    Irregular heart rhythm:        Vascular    Pain in calf, thigh, or hip brought on by ambulation:    Pain in feet at night that wakes you up from your sleep:     Blood clot in your veins:     Leg swelling:         Pulmonary    Oxygen at home:    Productive cough:     Wheezing:         Neurologic    Sudden weakness in arms or  legs:     Sudden numbness in arms or legs:     Sudden onset of difficulty speaking or slurred speech:    Temporary loss of vision in one eye:     Problems with dizziness:         Gastrointestinal    Blood in stool:     Vomited blood:         Genitourinary    Burning when urinating:     Blood in urine:        Psychiatric    Major depression:         Hematologic    Bleeding problems:    Problems with blood clotting too easily:        Skin    Rashes or ulcers:        Constitutional    Fever or chills:      PHYSICAL EXAM: Vitals:   05/03/22 0830  BP: 138/79  Pulse: 66  Resp: 16  Temp: 97.7 F (36.5 C)  TempSrc: Temporal  SpO2: 96%  Weight: 182 lb (82.6 kg)  Height: '5\' 10"'$  (1.778 m)    GENERAL: The patient is a well-nourished male, in no acute distress. The vital signs are documented above. CARDIAC: There is a regular rate and rhythm.  VASCULAR:  Palpable femoral pulses bilaterally Palpable DP pulses bilaterally PULMONARY: No respiratory distress. ABDOMEN: Soft and non-tender.  No pain with deep palpation. MUSCULOSKELETAL: There are no major deformities or cyanosis. NEUROLOGIC: No focal weakness or paresthesias are detected. SKIN: There are no ulcers or rashes noted. PSYCHIATRIC: The patient has a normal affect.  DATA:   MRI lumbar spine 03/16/2022 evidence of a 4.1 cm abdominal aneurysm increase 6 mm since 2021  Assessment/Plan:  71 year old male referred by Dr. Reatha Armour with [facility] for evaluation of a 4.1 cm abdominal aortic aneurysm noted on recent MRI from 03/16/2022.  This aneurysm has increased about 6 mm since 2021.  He has no pain on exam with palpation of his aneurysm.  Discussed that current guidelines are to repair AAA at greater than 5.5 cm in men unless there is rapid growth.  Discussed there is  no indication for surgical intervention at this time.  Discussed surveillance would be appropriate.  I will see him again in 1 year with AAA duplex here in the office.  Discussed growing 6 mm and 2 years is not rapid growth.  I discussed the importance of smoking cessation.  I think he can proceed with spinal surgery from my standpoint.   Marty Heck, MD [facility] Office: [phone]

## 2022-05-03 NOTE — Patient Instructions (Signed)
Thomas Morgan , Thank you for taking time to come for your Medicare Wellness Visit. I appreciate your ongoing commitment to your health goals. Please review the following plan we discussed and let me know if I can assist you in the future.   Screening recommendations/referrals: Colonoscopy: 12/19/14, declined referral Recommended yearly ophthalmology/optometry visit for glaucoma screening and checkup Recommended yearly dental visit for hygiene and checkup  Vaccinations: Influenza vaccine: 10/13/21 Pneumococcal vaccine: 12/12/16 Tdap vaccine: 06/06/07,  due if have injury Shingles vaccine: n/d   Covid-19: 12/26/19, 01/16/20  Advanced directives: yes  Conditions/risks identified: none  Next appointment: Follow up in one year for your annual wellness visit. 05/08/23 @ 1 pm by phone  Preventive Care 65 Years and Older, Male Preventive care refers to lifestyle choices and visits with your health care provider that can promote health and wellness. What does preventive care include? A yearly physical exam. This is also called an annual well check. Dental exams once or twice a year. Routine eye exams. Ask your health care provider how often you should have your eyes checked. Personal lifestyle choices, including: Daily care of your teeth and gums. Regular physical activity. Eating a healthy diet. Avoiding tobacco and drug use. Limiting alcohol use. Practicing safe sex. Taking low doses of aspirin every day. Taking vitamin and mineral supplements as recommended by your health care provider. What happens during an annual well check? The services and screenings done by your health care provider during your annual well check will depend on your age, overall health, lifestyle risk factors, and family history of disease. Counseling  Your health care provider may ask you questions about your: Alcohol use. Tobacco use. Drug use. Emotional well-being. Home and relationship well-being. Sexual  activity. Eating habits. History of falls. Memory and ability to understand (cognition). Work and work Statistician. Screening  You may have the following tests or measurements: Height, weight, and BMI. Blood pressure. Lipid and cholesterol levels. These may be checked every 5 years, or more frequently if you are over 57 years old. Skin check. Lung cancer screening. You may have this screening every year starting at age 72 if you have a 30-pack-year history of smoking and currently smoke or have quit within the past 15 years. Fecal occult blood test (FOBT) of the stool. You may have this test every year starting at age 72. Flexible sigmoidoscopy or colonoscopy. You may have a sigmoidoscopy every 5 years or a colonoscopy every 10 years starting at age 725. Prostate cancer screening. Recommendations will vary depending on your family history and other risks. Hepatitis C blood test. Hepatitis B blood test. Sexually transmitted disease (STD) testing. Diabetes screening. This is done by checking your blood sugar (glucose) after you have not eaten for a while (fasting). You may have this done every 1-3 years. Abdominal aortic aneurysm (AAA) screening. You may need this if you are a current or former smoker. Osteoporosis. You may be screened starting at age 72 if you are at high risk. Talk with your health care provider about your test results, treatment options, and if necessary, the need for more tests. Vaccines  Your health care provider may recommend certain vaccines, such as: Influenza vaccine. This is recommended every year. Tetanus, diphtheria, and acellular pertussis (Tdap, Td) vaccine. You may need a Td booster every 10 years. Zoster vaccine. You may need this after age 72. Pneumococcal 13-valent conjugate (PCV13) vaccine. One dose is recommended after age 29. Pneumococcal polysaccharide (PPSV23) vaccine. One dose is recommended after age  65. Talk to your health care provider about which  screenings and vaccines you need and how often you need them. This information is not intended to replace advice given to you by your health care provider. Make sure you discuss any questions you have with your health care provider. Document Released: 11/06/2015 Document Revised: 06/29/2016 Document Reviewed: 08/11/2015 Elsevier Interactive Patient Education  2017 [place] Prevention in the Home Falls can cause injuries. They can happen to people of all ages. There are many things you can do to make your home safe and to help prevent falls. What can I do on the outside of my home? Regularly fix the edges of walkways and driveways and fix any cracks. Remove anything that might make you trip as you walk through a door, such as a raised step or threshold. Trim any bushes or trees on the path to your home. Use bright outdoor lighting. Clear any walking paths of anything that might make someone trip, such as rocks or tools. Regularly check to see if handrails are loose or broken. Make sure that both sides of any steps have handrails. Any raised decks and porches should have guardrails on the edges. Have any leaves, snow, or ice cleared regularly. Use sand or salt on walking paths during winter. Clean up any spills in your garage right away. This includes oil or grease spills. What can I do in the bathroom? Use night lights. Install grab bars by the toilet and in the tub and shower. Do not use towel bars as grab bars. Use non-skid mats or decals in the tub or shower. If you need to sit down in the shower, use a plastic, non-slip stool. Keep the floor dry. Clean up any water that spills on the floor as soon as it happens. Remove soap buildup in the tub or shower regularly. Attach bath mats securely with double-sided non-slip rug tape. Do not have throw rugs and other things on the floor that can make you trip. What can I do in the bedroom? Use night lights. Make sure that you have a  light by your bed that is easy to reach. Do not use any sheets or blankets that are too big for your bed. They should not hang down onto the floor. Have a firm chair that has side arms. You can use this for support while you get dressed. Do not have throw rugs and other things on the floor that can make you trip. What can I do in the kitchen? Clean up any spills right away. Avoid walking on wet floors. Keep items that you use a lot in easy-to-reach places. If you need to reach something above you, use a strong step stool that has a grab bar. Keep electrical cords out of the way. Do not use floor polish or wax that makes floors slippery. If you must use wax, use non-skid floor wax. Do not have throw rugs and other things on the floor that can make you trip. What can I do with my stairs? Do not leave any items on the stairs. Make sure that there are handrails on both sides of the stairs and use them. Fix handrails that are broken or loose. Make sure that handrails are as long as the stairways. Check any carpeting to make sure that it is firmly attached to the stairs. Fix any carpet that is loose or worn. Avoid having throw rugs at the top or bottom of the stairs. If you do have  throw rugs, attach them to the floor with carpet tape. Make sure that you have a light switch at the top of the stairs and the bottom of the stairs. If you do not have them, ask someone to add them for you. What else can I do to help prevent falls? Wear shoes that: Do not have high heels. Have rubber bottoms. Are comfortable and fit you well. Are closed at the toe. Do not wear sandals. If you use a stepladder: Make sure that it is fully opened. Do not climb a closed stepladder. Make sure that both sides of the stepladder are locked into place. Ask someone to hold it for you, if possible. Clearly mark and make sure that you can see: Any grab bars or handrails. First and last steps. Where the edge of each step  is. Use tools that help you move around (mobility aids) if they are needed. These include: Canes. Walkers. Scooters. Crutches. Turn on the lights when you go into a dark area. Replace any light bulbs as soon as they burn out. Set up your furniture so you have a clear path. Avoid moving your furniture around. If any of your floors are uneven, fix them. If there are any pets around you, be aware of where they are. Review your medicines with your doctor. Some medicines can make you feel dizzy. This can increase your chance of falling. Ask your doctor what other things that you can do to help prevent falls. This information is not intended to replace advice given to you by your health care provider. Make sure you discuss any questions you have with your health care provider. Document Released: 08/06/2009 Document Revised: 03/17/2016 Document Reviewed: 11/14/2014 Elsevier Interactive Patient Education  2017 [employer].

## 2022-05-11 DIAGNOSIS — M48062 Spinal stenosis, lumbar region with neurogenic claudication: Secondary | ICD-10-CM | POA: Diagnosis not present

## 2022-06-09 DIAGNOSIS — M48062 Spinal stenosis, lumbar region with neurogenic claudication: Secondary | ICD-10-CM | POA: Diagnosis not present

## 2022-06-09 DIAGNOSIS — M48061 Spinal stenosis, lumbar region without neurogenic claudication: Secondary | ICD-10-CM | POA: Diagnosis not present

## 2022-06-17 ENCOUNTER — Ambulatory Visit (INDEPENDENT_AMBULATORY_CARE_PROVIDER_SITE_OTHER): Payer: Medicare PPO | Admitting: Vascular Surgery

## 2022-06-17 ENCOUNTER — Other Ambulatory Visit (INDEPENDENT_AMBULATORY_CARE_PROVIDER_SITE_OTHER): Payer: Medicare PPO

## 2022-06-18 ENCOUNTER — Emergency Department (HOSPITAL_COMMUNITY): Payer: Medicare PPO

## 2022-06-18 ENCOUNTER — Other Ambulatory Visit: Payer: Self-pay

## 2022-06-18 ENCOUNTER — Inpatient Hospital Stay (HOSPITAL_COMMUNITY)
Admission: EM | Admit: 2022-06-18 | Discharge: 2022-07-04 | DRG: 552 | Disposition: A | Discharge status: Home Health | Payer: Medicare PPO | Attending: Neurological Surgery | Admitting: Neurological Surgery

## 2022-06-18 ENCOUNTER — Encounter (HOSPITAL_COMMUNITY): Payer: Self-pay

## 2022-06-18 DIAGNOSIS — Z885 Allergy status to narcotic agent status: Secondary | ICD-10-CM | POA: Diagnosis not present

## 2022-06-18 DIAGNOSIS — Z8249 Family history of ischemic heart disease and other diseases of the circulatory system: Secondary | ICD-10-CM | POA: Diagnosis not present

## 2022-06-18 DIAGNOSIS — Z801 Family history of malignant neoplasm of trachea, bronchus and lung: Secondary | ICD-10-CM

## 2022-06-18 DIAGNOSIS — M62838 Other muscle spasm: Secondary | ICD-10-CM | POA: Diagnosis present

## 2022-06-18 DIAGNOSIS — M25551 Pain in right hip: Secondary | ICD-10-CM | POA: Diagnosis not present

## 2022-06-18 DIAGNOSIS — M549 Dorsalgia, unspecified: Secondary | ICD-10-CM | POA: Diagnosis not present

## 2022-06-18 DIAGNOSIS — F17210 Nicotine dependence, cigarettes, uncomplicated: Secondary | ICD-10-CM | POA: Diagnosis not present

## 2022-06-18 DIAGNOSIS — Z981 Arthrodesis status: Secondary | ICD-10-CM | POA: Diagnosis not present

## 2022-06-18 DIAGNOSIS — Z79899 Other long term (current) drug therapy: Secondary | ICD-10-CM | POA: Diagnosis not present

## 2022-06-18 DIAGNOSIS — M4807 Spinal stenosis, lumbosacral region: Secondary | ICD-10-CM | POA: Diagnosis not present

## 2022-06-18 DIAGNOSIS — Z888 Allergy status to other drugs, medicaments and biological substances status: Secondary | ICD-10-CM | POA: Diagnosis not present

## 2022-06-18 DIAGNOSIS — E876 Hypokalemia: Secondary | ICD-10-CM | POA: Diagnosis present

## 2022-06-18 DIAGNOSIS — Z85828 Personal history of other malignant neoplasm of skin: Secondary | ICD-10-CM | POA: Diagnosis not present

## 2022-06-18 DIAGNOSIS — M5441 Lumbago with sciatica, right side: Secondary | ICD-10-CM | POA: Diagnosis not present

## 2022-06-18 DIAGNOSIS — M5442 Lumbago with sciatica, left side: Secondary | ICD-10-CM | POA: Diagnosis not present

## 2022-06-18 DIAGNOSIS — M25751 Osteophyte, right hip: Secondary | ICD-10-CM | POA: Diagnosis not present

## 2022-06-18 DIAGNOSIS — S79911A Unspecified injury of right hip, initial encounter: Secondary | ICD-10-CM | POA: Diagnosis not present

## 2022-06-18 DIAGNOSIS — S76211A Strain of adductor muscle, fascia and tendon of right thigh, initial encounter: Secondary | ICD-10-CM | POA: Diagnosis not present

## 2022-06-18 DIAGNOSIS — M5416 Radiculopathy, lumbar region: Principal | ICD-10-CM | POA: Diagnosis present

## 2022-06-18 DIAGNOSIS — M545 Low back pain, unspecified: Secondary | ICD-10-CM | POA: Diagnosis not present

## 2022-06-18 DIAGNOSIS — M1611 Unilateral primary osteoarthritis, right hip: Secondary | ICD-10-CM | POA: Diagnosis not present

## 2022-06-18 DIAGNOSIS — R110 Nausea: Secondary | ICD-10-CM | POA: Diagnosis not present

## 2022-06-18 DIAGNOSIS — Z7982 Long term (current) use of aspirin: Secondary | ICD-10-CM | POA: Diagnosis not present

## 2022-06-18 DIAGNOSIS — M4317 Spondylolisthesis, lumbosacral region: Secondary | ICD-10-CM | POA: Diagnosis not present

## 2022-06-18 DIAGNOSIS — K5730 Diverticulosis of large intestine without perforation or abscess without bleeding: Secondary | ICD-10-CM | POA: Diagnosis not present

## 2022-06-18 DIAGNOSIS — I714 Abdominal aortic aneurysm, without rupture, unspecified: Secondary | ICD-10-CM | POA: Diagnosis not present

## 2022-06-18 DIAGNOSIS — K8020 Calculus of gallbladder without cholecystitis without obstruction: Secondary | ICD-10-CM | POA: Diagnosis not present

## 2022-06-18 LAB — BASIC METABOLIC PANEL
Anion gap: 8 (ref 5–15)
BUN: 11 mg/dL (ref 8–23)
CO2: 27 mmol/L (ref 22–32)
Calcium: 8.5 mg/dL — ABNORMAL LOW (ref 8.9–10.3)
Chloride: 103 mmol/L (ref 98–111)
Creatinine, Ser: 0.84 mg/dL (ref 0.61–1.24)
GFR, Estimated: >60 mL/min (ref >60)
Glucose, Bld: 150 mg/dL — ABNORMAL HIGH (ref 70–99)
Potassium: 2.9 mmol/L — ABNORMAL LOW (ref 3.5–5.1)
Sodium: 138 mmol/L (ref 135–145)

## 2022-06-18 LAB — CBC WITH DIFFERENTIAL/PLATELET
Abs Immature Granulocytes: 0.19 10*3/uL — ABNORMAL HIGH (ref 0.00–0.07)
Basophils Absolute: 0.1 10*3/uL (ref 0.0–0.1)
Basophils Relative: 1 %
Eosinophils Absolute: 0.1 10*3/uL (ref 0.0–0.5)
Eosinophils Relative: 1 %
HCT: 39.5 % (ref 39.0–52.0)
Hemoglobin: 13.4 g/dL (ref 13.0–17.0)
Immature Granulocytes: 2 %
Lymphocytes Relative: 8 %
Lymphs Abs: 1 10*3/uL (ref 0.7–4.0)
MCH: 32.1 pg (ref 26.0–34.0)
MCHC: 33.9 g/dL (ref 30.0–36.0)
MCV: 94.5 fL (ref 80.0–100.0)
Monocytes Absolute: 0.6 10*3/uL (ref 0.1–1.0)
Monocytes Relative: 5 %
Neutro Abs: 10.3 10*3/uL — ABNORMAL HIGH (ref 1.7–7.7)
Neutrophils Relative %: 83 %
Platelets: 303 10*3/uL (ref 150–400)
RBC: 4.18 MIL/uL — ABNORMAL LOW (ref 4.22–5.81)
RDW: 13.2 % (ref 11.5–15.5)
WBC: 12.3 10*3/uL — ABNORMAL HIGH (ref 4.0–10.5)
nRBC: 0 % (ref 0.0–0.2)

## 2022-06-18 MED ORDER — HYDROMORPHONE HCL 1 MG/ML IJ SOLN
0.5000 mg | Freq: Once | INTRAMUSCULAR | Status: AC
  Administered 2022-06-18: 0.5 mg via INTRAVENOUS
  Filled 2022-06-18: qty 1

## 2022-06-18 MED ORDER — KETOROLAC TROMETHAMINE 15 MG/ML IJ SOLN
15.0000 mg | Freq: Three times a day (TID) | INTRAMUSCULAR | Status: DC
  Administered 2022-06-18 – 2022-06-21 (×9): 15 mg via INTRAVENOUS
  Filled 2022-06-18 (×9): qty 1

## 2022-06-18 MED ORDER — ONDANSETRON HCL 4 MG/2ML IJ SOLN: 4.0000 mg | Freq: Once | INTRAMUSCULAR | Status: DC

## 2022-06-18 MED ORDER — METHOCARBAMOL 1000 MG/10ML IJ SOLN: 500.0000 mg | Freq: Four times a day (QID) | INTRAMUSCULAR | Status: DC | PRN

## 2022-06-18 MED ORDER — METHOCARBAMOL 1000 MG/10ML IJ SOLN
500.0000 mg | Freq: Once | INTRAVENOUS | Status: AC
  Administered 2022-06-18: 500 mg via INTRAVENOUS
  Filled 2022-06-18: qty 500

## 2022-06-18 MED ORDER — POTASSIUM CHLORIDE 10 MEQ/100ML IV SOLN
10.0000 meq | INTRAVENOUS | Status: AC
  Administered 2022-06-18 (×2): 10 meq via INTRAVENOUS
  Filled 2022-06-18: qty 100

## 2022-06-18 MED ORDER — BISACODYL 10 MG RE SUPP: 10.0000 mg | Freq: Every day | RECTAL | Status: DC | PRN

## 2022-06-18 MED ORDER — METHYLPREDNISOLONE SODIUM SUCC 125 MG IJ SOLR
60.0000 mg | Freq: Once | INTRAMUSCULAR | Status: AC
  Administered 2022-06-18: 60 mg via INTRAVENOUS
  Filled 2022-06-18: qty 2

## 2022-06-18 MED ORDER — OXYCODONE HCL 5 MG PO TABS
5.0000 mg | ORAL_TABLET | ORAL | Status: DC | PRN
  Administered 2022-06-18 – 2022-06-21 (×5): 5 mg via ORAL
  Filled 2022-06-18 (×5): qty 1

## 2022-06-18 MED ORDER — POTASSIUM CHLORIDE CRYS ER 20 MEQ PO TBCR
40.0000 meq | EXTENDED_RELEASE_TABLET | Freq: Once | ORAL | Status: AC
  Administered 2022-06-18: 40 meq via ORAL
  Filled 2022-06-18: qty 2

## 2022-06-18 MED ORDER — FLEET ENEMA 7-19 GM/118ML RE ENEM
1.0000 | ENEMA | Freq: Once | RECTAL | Status: AC | PRN
  Administered 2022-06-27: 1 via RECTAL
  Filled 2022-06-18 (×2): qty 1

## 2022-06-18 MED ORDER — METHYLPREDNISOLONE 4 MG PO TBPK: ORAL_TABLET | ORAL | 0 refills | Status: DC

## 2022-06-18 MED ORDER — ACETAMINOPHEN 650 MG RE SUPP: 650.0000 mg | Freq: Four times a day (QID) | RECTAL | Status: DC | PRN

## 2022-06-18 MED ORDER — ONDANSETRON HCL 4 MG PO TABS: 4.0000 mg | ORAL_TABLET | Freq: Four times a day (QID) | ORAL | Status: DC | PRN

## 2022-06-18 MED ORDER — ACETAMINOPHEN 325 MG PO TABS: 650.0000 mg | ORAL_TABLET | Freq: Four times a day (QID) | ORAL | Status: DC | PRN

## 2022-06-18 MED ORDER — ACETAMINOPHEN 500 MG PO TABS
1000.0000 mg | ORAL_TABLET | Freq: Once | ORAL | Status: AC
Start: 2022-06-18 — End: 2022-06-18
  Administered 2022-06-18: 1000 mg via ORAL
  Filled 2022-06-18: qty 2

## 2022-06-18 MED ORDER — FENTANYL CITRATE PF 50 MCG/ML IJ SOSY
12.5000 ug | PREFILLED_SYRINGE | INTRAMUSCULAR | Status: DC | PRN
  Administered 2022-06-20 – 2022-06-24 (×5): 50 ug via INTRAVENOUS
  Administered 2022-06-25: 25 ug via INTRAVENOUS
  Administered 2022-06-25 – 2022-06-28 (×4): 50 ug via INTRAVENOUS
  Filled 2022-06-18 (×11): qty 1

## 2022-06-18 MED ORDER — SODIUM CHLORIDE 0.9% FLUSH
3.0000 mL | Freq: Two times a day (BID) | INTRAVENOUS | Status: DC
  Administered 2022-06-18 – 2022-07-03 (×26): 3 mL via INTRAVENOUS

## 2022-06-18 MED ORDER — DIPHENHYDRAMINE HCL 50 MG/ML IJ SOLN
25.0000 mg | Freq: Once | INTRAMUSCULAR | Status: AC
  Administered 2022-06-18: 25 mg via INTRAVENOUS
  Filled 2022-06-18: qty 1

## 2022-06-18 MED ORDER — MORPHINE SULFATE (PF) 4 MG/ML IV SOLN
4.0000 mg | Freq: Once | INTRAVENOUS | Status: AC
  Administered 2022-06-18: 4 mg via INTRAVENOUS
  Filled 2022-06-18: qty 1

## 2022-06-18 MED ORDER — POLYETHYLENE GLYCOL 3350 17 G PO PACK
17.0000 g | PACK | Freq: Every day | ORAL | Status: DC | PRN
  Administered 2022-06-25 – 2022-06-27 (×2): 17 g via ORAL
  Filled 2022-06-18 (×2): qty 1

## 2022-06-18 MED ORDER — OXYCODONE HCL 5 MG PO TABS
5.0000 mg | ORAL_TABLET | Freq: Once | ORAL | Status: AC
  Administered 2022-06-18: 5 mg via ORAL
  Filled 2022-06-18: qty 1

## 2022-06-18 MED ORDER — DOCUSATE SODIUM 100 MG PO CAPS
100.0000 mg | ORAL_CAPSULE | Freq: Two times a day (BID) | ORAL | Status: DC
  Administered 2022-06-18 – 2022-07-04 (×28): 100 mg via ORAL
  Filled 2022-06-18 (×31): qty 1

## 2022-06-18 MED ORDER — METOCLOPRAMIDE HCL 5 MG/ML IJ SOLN
10.0000 mg | Freq: Once | INTRAMUSCULAR | Status: AC
  Administered 2022-06-18: 10 mg via INTRAVENOUS
  Filled 2022-06-18: qty 2

## 2022-06-18 MED ORDER — ONDANSETRON HCL 4 MG/2ML IJ SOLN: 4.0000 mg | Freq: Four times a day (QID) | INTRAMUSCULAR | Status: DC | PRN

## 2022-06-18 MED ORDER — SODIUM CHLORIDE 0.9 % IV SOLN: INTRAVENOUS | Status: DC

## 2022-06-18 NOTE — ED Notes (Signed)
Pt resting, laying supine, NAD noted, observed even RR and unlabored, side rails up x2 for safety, plan of care ongoing, pt expresses no needs or concerns at this time, call light within reach, no further concerns as of present. Pt aware still waiting to be brought to CT scanner at this time, pain 1/10 per pt.

## 2022-06-18 NOTE — H&P (Signed)
Chief Complaint: Back pain with radiculopathy  HPI: Thomas Morgan is a 71 y.o. male who is s/p open lumbar 3/4 laminectomy with Dr. Reatha Armour on 06/09/2022 due to lumbar stenosis with neurogenic claudication. His procedure was performed at the [facility] and was discharged home the same day in stable condition. He was recovering well until last night when he reports that he was sitting in his bed and twisted when he had an onset of severe low back pain that radiated into his right buttock and down his RLE. He was unable to ambulate due to the pain. Pain is reported to be of a different character than his preoperative pain. Since his symptoms persisted, he presented to the [facility] for evaluation. While in the ED, he was given a combination of PO and IV analgesics as well as IV steroids. His symptoms failed to improve while in the ED and was thus admitted for pain control and evaluation. He denies weakness, paresthesia, fevers, malaise, and wound complications. Bowel and bladder function are intact.   Past Medical History:  Diagnosis Date   Basal cell carcinoma    Complication of anesthesia    Personal history of tobacco use, presenting hazards to health 10/22/2015   PONV (postoperative nausea and vomiting)    gets very sick from anesthesia   Spinal stenosis     Past Surgical History:  Procedure Laterality Date   BACK SURGERY     BASAL CELL CARCINOMA EXCISION Left 2020   left arm   CYST REMOVAL TRUNK  2020   on his back   [place] Left 03/24/2017   Procedure: EXCISION MORTON'S NEUROMA;  Surgeon: Sharlotte Alamo, DPM;  Location: [facility] ORS;  Service: Podiatry;  Laterality: Left;   FOOT SURGERY Right 04/16/2021   SKIN CANCER EXCISION     WRIST SURGERY Right    Trauma resulting in prosthetics in the right wrist and elbow    Family History  Problem Relation Age of Onset   Cancer Mother        Lung   Hypertension Mother    Heart Problems  Father    AAA (abdominal aortic aneurysm) Father    Heart attack Brother    Hypercholesterolemia Brother    Social History:  reports that he has been smoking cigarettes. He has a 48.00 pack-year smoking history. He has never used smokeless tobacco. He reports that he does not drink alcohol and does not use drugs.  Allergies:  Allergies  Allergen Reactions   Hydrocodone Other (See Comments)    Palpitations, increased heart rate   Atorvastatin     Aching and feeling down   Crestor [Rosuvastatin] Other (See Comments)    Muscle aches   Lescol  [Fluvastatin]     Muscle Aches    (Not in a hospital admission)   Results for orders placed or performed during the hospital encounter of 06/18/22 (from the past 48 hour(s))  CBC with Differential     Status: Abnormal   Collection Time: 06/18/22  5:30 AM  Result Value Ref Range   WBC 12.3 (H) 4.0 - 10.5 K/uL   RBC 4.18 (L) 4.22 - 5.81 MIL/uL   Hemoglobin 13.4 13.0 - 17.0 g/dL   HCT 39.5 39.0 - 52.0 %   MCV 94.5 80.0 - 100.0 fL   MCH 32.1 26.0 - 34.0 pg   MCHC 33.9 30.0 - 36.0 g/dL   RDW 13.2 11.5 - 15.5 %  Platelets 303 150 - 400 K/uL   nRBC 0.0 0.0 - 0.2 %   Neutrophils Relative % 83 %   Neutro Abs 10.3 (H) 1.7 - 7.7 K/uL   Lymphocytes Relative 8 %   Lymphs Abs 1.0 0.7 - 4.0 K/uL   Monocytes Relative 5 %   Monocytes Absolute 0.6 0.1 - 1.0 K/uL   Eosinophils Relative 1 %   Eosinophils Absolute 0.1 0.0 - 0.5 K/uL   Basophils Relative 1 %   Basophils Absolute 0.1 0.0 - 0.1 K/uL   Immature Granulocytes 2 %   Abs Immature Granulocytes 0.19 (H) 0.00 - 0.07 K/uL    Comment: Performed at [facility] [address], [place], [place] [address]  Basic metabolic panel     Status: Abnormal   Collection Time: 06/18/22  5:30 AM  Result Value Ref Range   Sodium 138 135 - 145 mmol/L   Potassium 2.9 (L) 3.5 - 5.1 mmol/L   Chloride 103 98 - 111 mmol/L   CO2 27 22 - 32 mmol/L   Glucose, Bld 150 (H) 70 - 99 mg/dL    Comment: Glucose  reference range applies only to samples taken after fasting for at least 8 hours.   BUN 11 8 - 23 mg/dL   Creatinine, Ser 0.84 0.61 - 1.24 mg/dL   Calcium 8.5 (L) 8.9 - 10.3 mg/dL   GFR, Estimated >60 >60 mL/min    Comment: (NOTE) Calculated using the CKD-EPI Creatinine Equation (2021)    Anion gap 8 5 - 15    Comment: Performed at [facility] [address], [place], [place] [address]   CT Renal Stone Study  Result Date: 06/18/2022 CLINICAL DATA:  Right-sided flank and back pain for 2 days. EXAM: CT ABDOMEN AND PELVIS WITHOUT CONTRAST TECHNIQUE: Multidetector CT imaging of the abdomen and pelvis was performed following the standard protocol without IV contrast. RADIATION DOSE REDUCTION: This exam was performed according to the departmental dose-optimization program which includes automated exposure control, adjustment of the mA and/or kV according to patient size and/or use of iterative reconstruction technique. COMPARISON:  None Available. FINDINGS: Lower chest: No acute findings. Hepatobiliary: No mass visualized on this unenhanced exam. A few tiny less than 1 cm gallstones are noted. No evidence of cholecystitis or biliary ductal dilatation. Pancreas: No mass or inflammatory process visualized on this unenhanced exam. Spleen:  Within normal limits in size. Adrenals/Urinary tract: No evidence of urolithiasis or hydronephrosis. Unremarkable unopacified urinary bladder. Stomach/Bowel: No evidence of obstruction, inflammatory process, or abnormal fluid collections. Normal appendix visualized. Diverticulosis is seen mainly involving the descending and sigmoid colon, however there is no evidence of diverticulitis. Vascular/Lymphatic: No pathologically enlarged lymph nodes identified. 4.4 cm infrarenal abdominal aortic aneurysm seen. No evidence of aneurysm rupture. Reproductive:  No mass or other significant abnormality. Other:  None. Musculoskeletal: No suspicious bone lesions identified.  Bilateral L5 pars defects noted, with moderate degenerative disc disease and grade 1 anterolisthesis at L5-S1. IMPRESSION: No evidence of urolithiasis, hydronephrosis, or other acute findings. Cholelithiasis. No radiographic evidence of cholecystitis. Colonic diverticulosis, without radiographic evidence of diverticulitis. 4.4 cm infrarenal abdominal aortic aneurysm. Recommend follow-up every 12 months and vascular consultation. This recommendation follows ACR consensus guidelines: White Paper of the ACR Incidental Findings Committee II on Vascular Findings. J Am Coll Radiol 2013; 10:789-794. Bilateral L5 pars defects, with degenerative disc disease and grade 1 anterolisthesis at L5-S1. Electronically Signed   By: Marlaine Hind M.D.   On: 06/18/2022  09:54   CT L-SPINE NO CHARGE  Result Date: 06/18/2022 CLINICAL DATA:  Chronic back pain. Unable to ambulate tonight due to pain EXAM: CT Lumbar Spine without contrast TECHNIQUE: Technique: Multiplanar CT images of the lumbar spine were reconstructed from contemporary CT of the Abdomen and Pelvis. RADIATION DOSE REDUCTION: This exam was performed according to the departmental dose-optimization program which includes automated exposure control, adjustment of the mA and/or kV according to patient size and/or use of iterative reconstruction technique. CONTRAST:  None COMPARISON:  Lumbar MRI 03/16/2022 FINDINGS: Segmentation: 5 lumbar type vertebrae assuming hypoplastic ribs at T12 Alignment: Grade 1 anterolisthesis at L5-S1. Vertebrae: Chronic L5 pars defects. No acute fracture, discitis, or aggressive bone lesion. Paraspinal and other soft tissues: Negative for perispinal mass or inflammation. Retroperitoneal findings including known aneurysm or described on source abdominal CT Disc levels: T12- L1: Mild spondylosis L1-L2: Mild disc space narrowing and spondylitic spurring L2-L3: Mild disc bulging and endplate spurring X6-I6: Interval laminectomy.  No adverse findings.  L4-L5: Disc space narrowing and bulging. Degenerative facet spurring. Asymmetric left foraminal impingement based on prior MRI. L5-S1:Chronic L5 pars defects with anterolisthesis and accentuated disc narrowing and ridging. Biforaminal impingement. IMPRESSION: 1. No acute finding in the lumbar spine. 2. Interval L3-4 laminectomy for spinal stenosis. 3. L5 chronic pars defects with L5-S1 anterolisthesis and biforaminal stenosis. Electronically Signed   By: Jorje Guild M.D.   On: 06/18/2022 09:53    ROS: Per HPI  Blood pressure 127/78, pulse 69, temperature 97.8 F (36.6 C), temperature source Oral, resp. rate 12, height '5\' 10"'$  (1.778 m), weight 82.6 kg, SpO2 97 %. Physical Exam: Patient is awake, A/O X 4, and conversant. Eyes open spontaneously. They are in NAD and VSS. Speech is fluent and appropriate. MAEW. Sensation to light touch is intact. PERLA, EOMI. CNs grossly intact. Incision is well approximated with no drainage, erythema, or fluctuance.  Assessment/Plan 71 y.o. male who is s/p open lumbar 3/4 laminectomy on 06/09/2022. He was initially recovering well until yesterday when he had an acute onset of low back pain and RLE radiculopathy. CT lumbar spine reviewed. No acute findings were appreciated. Pain is likely musculature in nature. Plan to admit for pain control.    -Pain control -PT/OT   Marvis Moeller, DNP, AGNP-C Neurosurgery Nurse Practitioner  [facility] [place]. [address], [address], [place], [place] [address] P: [phone]    F: [phone]  06/18/2022 5:53 PM

## 2022-06-18 NOTE — ED Notes (Signed)
Pt's wife just called, was given an update via phone.

## 2022-06-18 NOTE — ED Notes (Signed)
Pt's potassium infusion dropped to 50 mL/hr as pt could not tolerate burning.

## 2022-06-18 NOTE — ED Provider Notes (Signed)
[facility] EMERGENCY DEPARTMENT Provider Note   CSN: [id number] Arrival date & time: 06/18/22  0424     History  Chief Complaint  Patient presents with   Back Pain    Thomas Morgan is a 71 y.o. male.  71 yo M with a chief complaints of right-sided back pain that radiates down the leg.  The patient just had a laminectomy done about a week ago.  He was sitting on the bed yesterday and felt severe sudden pain that radiated down the leg.  Feels like this is different than the pain he had prior to surgery.  Denies loss of bowel or bladder.  Denies numbness or weakness of the leg.  Pain is excruciating unable to twist turn and roll over.  No fevers.  Feels like his incision is healed well.   Back Pain      Home Medications Prior to Admission medications   Medication Sig Start Date End Date Taking? Authorizing Provider  acetaminophen-codeine (TYLENOL #3) 300-30 MG tablet Take 1 tablet by mouth every 6 (six) hours as needed. Patient not taking: Reported on 05/03/2022 03/23/22   [provider]  aspirin 81 MG EC tablet Take 81 mg by mouth daily.  08/21/06   [provider]  Cholecalciferol 25 MCG (1000 UT) capsule Take 1,000 Units by mouth daily. Patient not taking: Reported on 05/03/2022    [provider]  ezetimibe (ZETIA) 10 MG tablet Take 1 tablet (10 mg total) by mouth daily. 09/20/21   Birdie Sons, MD  gabapentin (NEURONTIN) 100 MG capsule Take 1 capsule (100 mg total) by mouth at bedtime. Patient not taking: Reported on 05/03/2022 12/08/21   Tyson Dense T, DPM  glucose blood [facility] VERIO) test strip TEST ONCE DAILY  Dx: E11.9 06/24/19   Birdie Sons, MD  hydrochlorothiazide (HYDRODIURIL) 50 MG tablet TAKE 1 TABLET BY MOUTH EVERY DAY 01/05/22   Birdie Sons, MD  lovastatin (MEVACOR) 40 MG tablet TAKE 1 TABLET BY MOUTH EVERY DAY 04/29/22   Birdie Sons, MD  omeprazole (PRILOSEC) 40 MG capsule TAKE 1 CAPSULE(40 MG) BY MOUTH  DAILY 11/23/21   Birdie Sons, MD  OneTouch Delica Lancets 15V MISC Use to check blood sugar once a day.  DX: E11.9 06/24/19   Birdie Sons, MD  traMADol (ULTRAM) 50 MG tablet Take 1-2 tablets by mouth every 6 (six) hours as needed. 07/02/21   [provider]      Allergies    Hydrocodone, Atorvastatin, Crestor [rosuvastatin], and Lescol  [fluvastatin]    Review of Systems   Review of Systems  Musculoskeletal:  Positive for back pain.    Physical Exam Updated Vital Signs BP 114/78   Pulse 72   Temp 97.7 F (36.5 C) (Oral)   Resp 11   Ht '5\' 10"'$  (1.778 m)   Wt 82.6 kg   SpO2 98%   BMI 26.11 kg/m  Physical Exam Vitals and nursing note reviewed.  Constitutional:      Appearance: He is well-developed.  HENT:     Head: Normocephalic and atraumatic.  Eyes:     Pupils: Pupils are equal, round, and reactive to light.  Neck:     Vascular: No JVD.  Cardiovascular:     Rate and Rhythm: Normal rate and regular rhythm.     Heart sounds: No murmur heard.    No friction rub. No gallop.  Pulmonary:     Effort: No respiratory distress.  Breath sounds: No wheezing.  Abdominal:     General: There is no distension.     Tenderness: There is no abdominal tenderness. There is no guarding or rebound.  Musculoskeletal:        General: Normal range of motion.     Cervical back: Normal range of motion and neck supple.     Comments: Pulse motor and sensation intact to the right lower extremity.  Reflexes +1 and equal.  No clonus negative Babinski.  Incision appears clean dry and intact.  Skin:    Coloration: Skin is not pale.     Findings: No rash.  Neurological:     Mental Status: He is alert and oriented to person, place, and time.  Psychiatric:        Behavior: Behavior normal.     ED Results / Procedures / Treatments   Labs (all labs ordered are listed, but only abnormal results are displayed) Labs Reviewed  CBC WITH DIFFERENTIAL/PLATELET - Abnormal; Notable for  the following components:      Result Value   WBC 12.3 (*)    RBC 4.18 (*)    Neutro Abs 10.3 (*)    Abs Immature Granulocytes 0.19 (*)    All other components within normal limits  BASIC METABOLIC PANEL - Abnormal; Notable for the following components:   Potassium 2.9 (*)    Glucose, Bld 150 (*)    Calcium 8.5 (*)    All other components within normal limits  MAGNESIUM    EKG None  Radiology No results found.  Procedures Procedures    Medications Ordered in ED Medications  potassium chloride 10 mEq in 100 mL IVPB (has no administration in time range)  morphine (PF) 4 MG/ML injection 4 mg (4 mg Intravenous Given 06/18/22 0532)  acetaminophen (TYLENOL) tablet 1,000 mg (1,000 mg Oral Given 06/18/22 0531)  metoCLOPramide (REGLAN) injection 10 mg (10 mg Intravenous Given 06/18/22 0532)  diphenhydrAMINE (BENADRYL) injection 25 mg (25 mg Intravenous Given 06/18/22 0532)    ED Course/ Medical Decision Making/ A&P                           Medical Decision Making Amount and/or Complexity of Data Reviewed Labs: ordered. Radiology: ordered.  Risk OTC drugs. Prescription drug management.   71 yo M with a chief complaints of right-sided low back pain that radiates down the leg.  This started suddenly when he had sat on his bed yesterday evening.  He just had a laminectomy done about a week ago.  He denies any fevers denies loss of bowel or bladder denies loss of sensation denies numbness or weakness of the leg.  Having severe right-sided pain.  He also has a history of a AAA.  Increasing in size over time.  We will obtain a CT of the abdomen pelvis with reformats of the L-spine.  Likely requiring MRI as well.  Signed out to Dr. Francia Greaves please see their note for further details of care.  The patients results and plan were reviewed and discussed.   Any x-rays performed were independently reviewed by myself.   Differential diagnosis were considered with the presenting  HPI.  Medications  potassium chloride 10 mEq in 100 mL IVPB (has no administration in time range)  morphine (PF) 4 MG/ML injection 4 mg (4 mg Intravenous Given 06/18/22 0532)  acetaminophen (TYLENOL) tablet 1,000 mg (1,000 mg Oral Given 06/18/22 0531)  metoCLOPramide (REGLAN) injection 10 mg (10  mg Intravenous Given 06/18/22 0532)  diphenhydrAMINE (BENADRYL) injection 25 mg (25 mg Intravenous Given 06/18/22 0532)    Vitals:   06/18/22 0435 06/18/22 0500 06/18/22 0612 06/18/22 0630  BP:  135/78 116/72 114/78  Pulse:  88 66 72  Resp:  '15 16 11  '$ Temp:      TempSrc:      SpO2:  100% 99% 98%  Weight: 82.6 kg     Height: '5\' 10"'$  (1.778 m)       Final diagnoses:  Acute right-sided low back pain with bilateral sciatica            Final Clinical Impression(s) / ED Diagnoses Final diagnoses:  Acute right-sided low back pain with bilateral sciatica    Rx / DC Orders ED Discharge Orders     None         Deno Etienne, DO 06/18/22 5732

## 2022-06-18 NOTE — ED Provider Notes (Signed)
5:27 PM Patient tried to get up but could not sit up due to intractable pain.  He is unable to go home.  Neurosurgery reconsulted.  5:49 PM I discussed with Weston Brass.  He will come admit patient.  Advises to try some IV Robaxin.   Sherwood Gambler, MD 06/18/22 [phone]

## 2022-06-18 NOTE — ED Notes (Signed)
Pt unable to sit up on the side of bed by himself.  Pt screamed out in pain.  Dr. Regenia Skeeter notified.

## 2022-06-18 NOTE — ED Triage Notes (Signed)
Pt from home BIB [facility] d/t chronic back pain, unable to ambulate tonight d/t pain, pt had recent back sx last Thursday, pain has been manageable until 10 pm last night. Pt denies any new injuries or straining his back. Pt reports sciatica like pain from right buttock down to his RLE.  VSS per EMS, GCS 15, 200 mcg Fentanyl IV in route, pt placed on 2L [place] d/t decrease sats after pain meds. 8 mg Zofran IV administer for nausea.

## 2022-06-18 NOTE — Discharge Instructions (Signed)
Return for any problem.  ?

## 2022-06-18 NOTE — ED Provider Notes (Signed)
Seen after prior EDP.  CT imaging and case discussed with neurosurgery service.  Glenford Peers recommends pain control here in the ED.  Mcdaniel does not feel that the patient requires MRI.  Patient administered steroids at request of neurosurgery service.  With pain medication and steroids the patient is improving.  Patient would likely be able to tolerate pain and return home as per McDaniel's recommendation.  Patient does have oxycodone at home for pain control.     Valarie Merino, MD 06/18/22 1450

## 2022-06-19 LAB — CBC WITH DIFFERENTIAL/PLATELET
Abs Immature Granulocytes: 0.16 10*3/uL — ABNORMAL HIGH (ref 0.00–0.07)
Basophils Absolute: 0 10*3/uL (ref 0.0–0.1)
Basophils Relative: 0 %
Eosinophils Absolute: 0 10*3/uL (ref 0.0–0.5)
Eosinophils Relative: 0 %
HCT: 39.7 % (ref 39.0–52.0)
Hemoglobin: 13.4 g/dL (ref 13.0–17.0)
Immature Granulocytes: 1 %
Lymphocytes Relative: 7 %
Lymphs Abs: 1 10*3/uL (ref 0.7–4.0)
MCH: 32.1 pg (ref 26.0–34.0)
MCHC: 33.8 g/dL (ref 30.0–36.0)
MCV: 95.2 fL (ref 80.0–100.0)
Monocytes Absolute: 0.3 10*3/uL (ref 0.1–1.0)
Monocytes Relative: 2 %
Neutro Abs: 13.4 10*3/uL — ABNORMAL HIGH (ref 1.7–7.7)
Neutrophils Relative %: 90 %
Platelets: 361 10*3/uL (ref 150–400)
RBC: 4.17 MIL/uL — ABNORMAL LOW (ref 4.22–5.81)
RDW: 13.3 % (ref 11.5–15.5)
WBC: 14.8 10*3/uL — ABNORMAL HIGH (ref 4.0–10.5)
nRBC: 0 % (ref 0.0–0.2)

## 2022-06-19 LAB — COMPREHENSIVE METABOLIC PANEL
ALT: 19 U/L (ref 0–44)
AST: 15 U/L (ref 15–41)
Albumin: 3.2 g/dL — ABNORMAL LOW (ref 3.5–5.0)
Alkaline Phosphatase: 63 U/L (ref 38–126)
Anion gap: 9 (ref 5–15)
BUN: 17 mg/dL (ref 8–23)
CO2: 25 mmol/L (ref 22–32)
Calcium: 8.8 mg/dL — ABNORMAL LOW (ref 8.9–10.3)
Chloride: 106 mmol/L (ref 98–111)
Creatinine, Ser: 0.78 mg/dL (ref 0.61–1.24)
GFR, Estimated: >60 mL/min (ref >60)
Glucose, Bld: 154 mg/dL — ABNORMAL HIGH (ref 70–99)
Potassium: 4 mmol/L (ref 3.5–5.1)
Sodium: 140 mmol/L (ref 135–145)
Total Bilirubin: 0.5 mg/dL (ref 0.3–1.2)
Total Protein: 5.5 g/dL — ABNORMAL LOW (ref 6.5–8.1)

## 2022-06-19 LAB — MAGNESIUM: Magnesium: 2.1 mg/dL (ref 1.7–2.4)

## 2022-06-19 MED ORDER — GABAPENTIN 300 MG PO CAPS
300.0000 mg | ORAL_CAPSULE | Freq: Three times a day (TID) | ORAL | Status: DC
Start: 2022-06-19 — End: 2022-06-21
  Administered 2022-06-19 – 2022-06-21 (×7): 300 mg via ORAL
  Filled 2022-06-19 (×7): qty 1

## 2022-06-19 MED ORDER — PANTOPRAZOLE SODIUM 40 MG PO TBEC
40.0000 mg | DELAYED_RELEASE_TABLET | Freq: Every day | ORAL | Status: DC
  Administered 2022-06-19 – 2022-07-04 (×16): 40 mg via ORAL
  Filled 2022-06-19 (×16): qty 1

## 2022-06-19 MED ORDER — DEXAMETHASONE SODIUM PHOSPHATE 4 MG/ML IJ SOLN
4.0000 mg | Freq: Four times a day (QID) | INTRAMUSCULAR | Status: DC
Start: 2022-06-19 — End: 2022-07-04
  Administered 2022-06-19 – 2022-07-04 (×60): 4 mg via INTRAVENOUS
  Filled 2022-06-19 (×61): qty 1

## 2022-06-19 NOTE — Progress Notes (Signed)
Subjective: Patient reports pain most severe in his right buttock and radiates down his RLE into his foot. Pain minimal with lying in bed. Pain significantly exacerbated with flexion at the waist. Pain moderate with scooting back and forth in bed.   Objective: Vital signs in last 24 hours: Temp:  [97.8 F (36.6 C)-98.6 F (37 C)] 98 F (36.7 C) (08/27 0658) Pulse Rate:  [66-96] 70 (08/27 1000) Resp:  [12-19] 17 (08/27 0604) BP: (117-131)/(68-81) 119/68 (08/27 1000) SpO2:  [94 %-98 %] 94 % (08/27 1000)  Intake/Output from previous day: 08/26 0701 - 08/27 0700 In: 50 [IV Piggyback:50] Out: -  Intake/Output this shift: No intake/output data recorded.  Physical Exam: Patient is awake, A/O X 4, and conversant. Eyes open spontaneously. They are in NAD and VSS. Speech is fluent and appropriate. MAEW with good strength that is symmetrical. Sensation to light touch is intact. PERLA, EOMI. CNs grossly intact. Incision is well approximated with no drainage, erythema, or fluctuance.  Lab Results: Recent Labs    06/18/22 0530 06/19/22 0133  WBC 12.3* 14.8*  HGB 13.4 13.4  HCT 39.5 39.7  PLT 303 361   BMET Recent Labs    06/18/22 0530 06/19/22 0133  NA 138 140  K 2.9* 4.0  CL 103 106  CO2 27 25  GLUCOSE 150* 154*  BUN 11 17  CREATININE 0.84 0.78  CALCIUM 8.5* 8.8*    Studies/Results: CT Renal Stone Study  Result Date: 06/18/2022 CLINICAL DATA:  Right-sided flank and back pain for 2 days. EXAM: CT ABDOMEN AND PELVIS WITHOUT CONTRAST TECHNIQUE: Multidetector CT imaging of the abdomen and pelvis was performed following the standard protocol without IV contrast. RADIATION DOSE REDUCTION: This exam was performed according to the departmental dose-optimization program which includes automated exposure control, adjustment of the mA and/or kV according to patient size and/or use of iterative reconstruction technique. COMPARISON:  None Available. FINDINGS: Lower chest: No acute findings.  Hepatobiliary: No mass visualized on this unenhanced exam. A few tiny less than 1 cm gallstones are noted. No evidence of cholecystitis or biliary ductal dilatation. Pancreas: No mass or inflammatory process visualized on this unenhanced exam. Spleen:  Within normal limits in size. Adrenals/Urinary tract: No evidence of urolithiasis or hydronephrosis. Unremarkable unopacified urinary bladder. Stomach/Bowel: No evidence of obstruction, inflammatory process, or abnormal fluid collections. Normal appendix visualized. Diverticulosis is seen mainly involving the descending and sigmoid colon, however there is no evidence of diverticulitis. Vascular/Lymphatic: No pathologically enlarged lymph nodes identified. 4.4 cm infrarenal abdominal aortic aneurysm seen. No evidence of aneurysm rupture. Reproductive:  No mass or other significant abnormality. Other:  None. Musculoskeletal: No suspicious bone lesions identified. Bilateral L5 pars defects noted, with moderate degenerative disc disease and grade 1 anterolisthesis at L5-S1. IMPRESSION: No evidence of urolithiasis, hydronephrosis, or other acute findings. Cholelithiasis. No radiographic evidence of cholecystitis. Colonic diverticulosis, without radiographic evidence of diverticulitis. 4.4 cm infrarenal abdominal aortic aneurysm. Recommend follow-up every 12 months and vascular consultation. This recommendation follows ACR consensus guidelines: White Paper of the ACR Incidental Findings Committee II on Vascular Findings. J Am Coll Radiol 2013; 10:789-794. Bilateral L5 pars defects, with degenerative disc disease and grade 1 anterolisthesis at L5-S1. Electronically Signed   By: Marlaine Hind M.D.   On: 06/18/2022 09:54   CT L-SPINE NO CHARGE  Result Date: 06/18/2022 CLINICAL DATA:  Chronic back pain. Unable to ambulate tonight due to pain EXAM: CT Lumbar Spine without contrast TECHNIQUE: Technique: Multiplanar CT images of the lumbar spine  were reconstructed from  contemporary CT of the Abdomen and Pelvis. RADIATION DOSE REDUCTION: This exam was performed according to the departmental dose-optimization program which includes automated exposure control, adjustment of the mA and/or kV according to patient size and/or use of iterative reconstruction technique. CONTRAST:  None COMPARISON:  Lumbar MRI 03/16/2022 FINDINGS: Segmentation: 5 lumbar type vertebrae assuming hypoplastic ribs at T12 Alignment: Grade 1 anterolisthesis at L5-S1. Vertebrae: Chronic L5 pars defects. No acute fracture, discitis, or aggressive bone lesion. Paraspinal and other soft tissues: Negative for perispinal mass or inflammation. Retroperitoneal findings including known aneurysm or described on source abdominal CT Disc levels: T12- L1: Mild spondylosis L1-L2: Mild disc space narrowing and spondylitic spurring L2-L3: Mild disc bulging and endplate spurring J4-H7: Interval laminectomy.  No adverse findings. L4-L5: Disc space narrowing and bulging. Degenerative facet spurring. Asymmetric left foraminal impingement based on prior MRI. L5-S1:Chronic L5 pars defects with anterolisthesis and accentuated disc narrowing and ridging. Biforaminal impingement. IMPRESSION: 1. No acute finding in the lumbar spine. 2. Interval L3-4 laminectomy for spinal stenosis. 3. L5 chronic pars defects with L5-S1 anterolisthesis and biforaminal stenosis. Electronically Signed   By: Jorje Guild M.D.   On: 06/18/2022 09:53    Assessment/Plan: 71 y.o. male who is s/p open lumbar 3/4 laminectomy on 06/09/2022. He was initially recovering well until yesterday when he had an acute onset of low back pain and RLE radiculopathy. CT lumbar spine reviewed. No acute findings were appreciated. Pain is likely musculature in nature with postoperative nerve irritation. Admitted for pain control. Will add Decadron and gabapentin today. PT/OT evaluation. If he develops weakness, will plan to proceed with MRI L spine with and without contrast.        -Pain control -PT/OT  LOS: 1 day     Marvis Moeller, DNP, AGNP-C Neurosurgery Nurse Practitioner  [facility] [place]. [address], [address], [place], [place] [address] P: [phone]    F: [phone]  06/19/2022 10:29 AM

## 2022-06-19 NOTE — ED Notes (Signed)
RN assumed care of pt.  Pt indicated that at this time he is "OK".  He does not seem to be in distress and is comfortable.  He did request water which was given.  No other issues or requests at this time.

## 2022-06-19 NOTE — ED Notes (Signed)
Spouse Hassan Rowan called [phone]) and requested an update.  After getting permission from pt, RN did call her back and gave update.  NO other requests at this time.

## 2022-06-19 NOTE — ED Notes (Signed)
Pt lying in bed this AM. Pt ate a small amount of breakfast but states he could not sit up long d/t pain. Pt states pain is controlled when still but shoots to a 10 when he moves. PO medicine given with scheduled stool softener for pain control. Pt asked for toothbrush, given toothbrush and toothpaste, socks, and no-rinse cleanser and lotion. No other needs at this time

## 2022-06-20 ENCOUNTER — Inpatient Hospital Stay (HOSPITAL_COMMUNITY): Payer: Medicare PPO

## 2022-06-20 MED ORDER — GADOBUTROL 1 MMOL/ML IV SOLN
8.0000 mL | Freq: Once | INTRAVENOUS | Status: AC | PRN
  Administered 2022-06-20: 8 mL via INTRAVENOUS

## 2022-06-20 NOTE — Plan of Care (Signed)

## 2022-06-20 NOTE — Evaluation (Signed)
Occupational Therapy Evaluation Patient Details Name: Thomas Morgan MRN: [id number] DOB: 06-11-1951 Today's Date: 06/20/2022   History of Present Illness Pt is a 71 y/o male admitted secondary to increased back and RLE pain. Pt with recent laminectomy on 8/17. PMH includes basal cell carcinoma.   Clinical Impression   PTA, pt was living with his wife and was independent; reports he was doing well s/p lami. Pt currently requiring Max A for toileting, bathing, and dressing. Despite significant pain, pt willing to attempt sitting at EOB. Pt performing rolling to R with supervision. However, when attempting to bring LEs over EOB, pt with significant pain and unable to complete sitting at EOB.  Pt would benefit from further acute OT to facilitate safe dc. Pending pt progress and pain management, recommend dc to home once medically stable per physician.       Recommendations for follow up therapy are one component of a multi-disciplinary discharge planning process, led by the attending physician.  Recommendations may be updated based on patient status, additional functional criteria and insurance authorization.   Follow Up Recommendations  No OT follow up (Pending progress and pain management)    Assistance Recommended at Discharge Frequent or constant Supervision/Assistance  Patient can return home with the following      Functional Status Assessment  Patient has had a recent decline in their functional status and demonstrates the ability to make significant improvements in function in a reasonable and predictable amount of time.  Equipment Recommendations  BSC/3in1    Recommendations for Other Services PT consult     Precautions / Restrictions Precautions Precautions: Back Precaution Booklet Issued: No Restrictions Weight Bearing Restrictions: No      Mobility Bed Mobility Overal bed mobility: Needs Assistance Bed Mobility: Rolling Rolling: Supervision         General bed  mobility comments: Required A to scoot hips prior to rolling, but able to roll with supervision. When attempting to place legs over side of bed, pt with increased pain and unable to tolerate further attempts to sit.    Transfers                   General transfer comment: unable secondary to pain      Balance                                           ADL either performed or assessed with clinical judgement   ADL Overall ADL's : Needs assistance/impaired                                       General ADL Comments: Max A for bathing, dressing, and toileting due to pain.     Vision         Perception     Praxis      Pertinent Vitals/Pain Pain Assessment Pain Assessment: Faces Faces Pain Scale: Hurts worst Pain Location: back Pain Descriptors / Indicators: Grimacing, Guarding Pain Intervention(s): Monitored during session, Limited activity within patient's tolerance, Repositioned     Hand Dominance     Extremity/Trunk Assessment Upper Extremity Assessment Upper Extremity Assessment: Overall WFL for tasks assessed   Lower Extremity Assessment Lower Extremity Assessment: Defer to PT evaluation RLE Deficits / Details: Shooting pain throughout RLE especially when attempting dependent  position in sitting.   Cervical / Trunk Assessment Cervical / Trunk Assessment: Other exceptions Cervical / Trunk Exceptions: recent laminectomy. Increased back pain   Communication Communication Communication: No difficulties   Cognition Arousal/Alertness: Awake/alert Behavior During Therapy: WFL for tasks assessed/performed Overall Cognitive Status: Within Functional Limits for tasks assessed                                       General Comments  Wife present throughout    Exercises     Shoulder Instructions      Home Living Family/patient expects to be discharged to:: Private residence Living Arrangements:  Spouse/significant other Available Help at Discharge: Family Type of Home: House Home Access: Stairs to enter Technical brewer of Steps: 3 Entrance Stairs-Rails: None Home Layout: One level     Bathroom Shower/Tub: Occupational psychologist: Gantt: None          Prior Functioning/Environment Prior Level of Function : Independent/Modified Independent                        OT Problem List: Decreased strength;Decreased range of motion;Decreased activity tolerance;Impaired balance (sitting and/or standing);Decreased knowledge of use of DME or AE;Decreased knowledge of precautions      OT Treatment/Interventions: Self-care/ADL training;Therapeutic exercise;Energy conservation;DME and/or AE instruction;Therapeutic activities;Patient/family education    OT Goals(Current goals can be found in the care plan section) Acute Rehab OT Goals Patient Stated Goal: Stop pain OT Goal Formulation: With patient/family Time For Goal Achievement: 07/04/22 Potential to Achieve Goals: Good  OT Frequency: Min 2X/week    Co-evaluation PT/OT/SLP Co-Evaluation/Treatment: Yes Reason for Co-Treatment: For patient/therapist safety;To address functional/ADL transfers;Other (comment) (pain management) PT goals addressed during session: Balance OT goals addressed during session: ADL's and self-care      AM-PAC OT "6 Clicks" Daily Activity     Outcome Measure Help from another person eating meals?: A Little Help from another person taking care of personal grooming?: A Little Help from another person toileting, which includes using toliet, bedpan, or urinal?: A Lot Help from another person bathing (including washing, rinsing, drying)?: A Lot Help from another person to put on and taking off regular upper body clothing?: A Lot Help from another person to put on and taking off regular lower body clothing?: A Lot 6 Click Score: 14   End of Session Nurse  Communication: Mobility status  Activity Tolerance: Patient limited by pain Patient left: in bed;with call bell/phone within reach;with family/visitor present  OT Visit Diagnosis: Muscle weakness (generalized) (M62.81);Pain;Other symptoms and signs involving cognitive function;Other symptoms and signs involving the nervous system (R29.898) Pain - Right/Left: Right Pain - part of body: Leg (back)                Time: 4665-9935 OT Time Calculation (min): 19 min Charges:  OT General Charges $OT Visit: 1 Visit OT Evaluation $OT Eval Low Complexity: 1 Low  Denielle Bayard MSOT, OTR/L Acute Rehab Office: [place] 06/20/2022, 10:41 AM

## 2022-06-20 NOTE — Progress Notes (Signed)
Subjective: Patient reports   Persistent right leg pain  Objective: Vital signs in last 24 hours: Temp:  [97.9 F (36.6 C)-98.4 F (36.9 C)] 97.9 F (36.6 C) (08/28 1145) Pulse Rate:  [51-80] 67 (08/28 1145) Resp:  [15-20] 15 (08/28 1145) BP: (104-143)/(52-86) 136/73 (08/28 1145) SpO2:  [94 %-100 %] 96 % (08/28 1145)  Intake/Output from previous day: 08/27 0701 - 08/28 0700 In: -  Out: 700 [Urine:700] Intake/Output this shift: No intake/output data recorded.   No acute distress  Appears comfortable in bed 5/5 strength in knee extension, dorsiflexion, plantar flexion bilaterally  Lab Results: Recent Labs    06/18/22 0530 06/19/22 0133  WBC 12.3* 14.8*  HGB 13.4 13.4  HCT 39.5 39.7  PLT 303 361   BMET Recent Labs    06/18/22 0530 06/19/22 0133  NA 138 140  K 2.9* 4.0  CL 103 106  CO2 27 25  GLUCOSE 150* 154*  BUN 11 17  CREATININE 0.84 0.78  CALCIUM 8.5* 8.8*    Studies/Results: No results found.  Assessment/Plan:  This is a 71 year old man status post recent L3-4 posterior decompression who has recurrent radicular symptoms.  As patient states he is having difficulty walking due to the new pain, I will order an MRI of his lumbar spine with and without contrast.    Continue to mobilize with PT, OT with suspected disposition of home.  Vallarie Mare 06/20/2022, 1:06 PM

## 2022-06-20 NOTE — ED Notes (Signed)
Pt indicates that he is unable to sleep.  He has been trying to, RN has observed him still in the bed w/ no signs of distress noted.

## 2022-06-20 NOTE — Evaluation (Signed)
Physical Therapy Evaluation Patient Details Name: Thomas Morgan MRN: [id number] DOB: 04-17-1951 Today's Date: 06/20/2022  History of Present Illness  Pt is a 71 y/o male admitted secondary to increased back and RLE pain. Pt with recent laminectomy on 8/17. PMH includes basal cell carcinoma.  Clinical Impression  Pt admitted secondary to problem above with deficits below. Pt with increased pain and only able to tolerate rolling to side. Attempted to sit X2, however, unable secondary to pain. Anticipate pt will progress well once pain controlled. Will need to progress mobility further to determine most appropriate d/c recommendations. Will continue to follow acutely.        Recommendations for follow up therapy are one component of a multi-disciplinary discharge planning process, led by the attending physician.  Recommendations may be updated based on patient status, additional functional criteria and insurance authorization.  Follow Up Recommendations Other (comment) (TBD pending progression)      Assistance Recommended at Discharge Intermittent Supervision/Assistance  Patient can return home with the following  A little help with walking and/or transfers;A little help with bathing/dressing/bathroom;Assistance with cooking/housework;Help with stairs or ramp for entrance;Assist for transportation    Equipment Recommendations Other (comment) (TBD)  Recommendations for Other Services       Functional Status Assessment Patient has had a recent decline in their functional status and demonstrates the ability to make significant improvements in function in a reasonable and predictable amount of time.     Precautions / Restrictions Precautions Precautions: Back Precaution Booklet Issued: No Restrictions Weight Bearing Restrictions: No      Mobility  Bed Mobility Overal bed mobility: Needs Assistance Bed Mobility: Rolling Rolling: Supervision         General bed mobility comments:  Required A to scoot hips prior to rolling, but able to roll with supervision. When attempting to place legs over side of bed, pt with increased pain and unable to tolerate further attempts to sit.    Transfers                   General transfer comment: unable secondary to pain    Ambulation/Gait                  Stairs            Wheelchair Mobility    Modified Rankin (Stroke Patients Only)       Balance                                             Pertinent Vitals/Pain Pain Assessment Pain Assessment: Faces Faces Pain Scale: Hurts worst Pain Location: back Pain Descriptors / Indicators: Grimacing, Guarding Pain Intervention(s): Limited activity within patient's tolerance, Monitored during session, Repositioned    Home Living Family/patient expects to be discharged to:: Private residence Living Arrangements: Spouse/significant other Available Help at Discharge: Family Type of Home: House Home Access: Stairs to enter Entrance Stairs-Rails: None Technical brewer of Steps: 3   Home Layout: One level Home Equipment: None      Prior Function Prior Level of Function : Independent/Modified Independent                     Hand Dominance        Extremity/Trunk Assessment   Upper Extremity Assessment Upper Extremity Assessment: Defer to OT evaluation    Lower Extremity Assessment Lower  Extremity Assessment: RLE deficits/detail RLE Deficits / Details: Shooting pain throughout RLE especially when attempting dependent position in sitting.    Cervical / Trunk Assessment Cervical / Trunk Assessment: Other exceptions Cervical / Trunk Exceptions: recent laminectomy. Increased back pain  Communication   Communication: No difficulties  Cognition Arousal/Alertness: Awake/alert Behavior During Therapy: WFL for tasks assessed/performed Overall Cognitive Status: Within Functional Limits for tasks assessed                                           General Comments General comments (skin integrity, edema, etc.): Pt's wife present    Exercises     Assessment/Plan    PT Assessment Patient needs continued PT services  PT Problem List Decreased activity tolerance;Decreased balance;Decreased mobility;Pain       PT Treatment Interventions Gait training;DME instruction;Stair training;Functional mobility training;Therapeutic activities;Therapeutic exercise;Balance training;Patient/family education    PT Goals (Current goals can be found in the Care Plan section)  Acute Rehab PT Goals Patient Stated Goal: to decrease pain PT Goal Formulation: With patient Time For Goal Achievement: 07/04/22 Potential to Achieve Goals: Good    Frequency Min 3X/week     Co-evaluation PT/OT/SLP Co-Evaluation/Treatment: Yes Reason for Co-Treatment: To address functional/ADL transfers;For patient/therapist safety PT goals addressed during session: Balance         AM-PAC PT "6 Clicks" Mobility  Outcome Measure Help needed turning from your back to your side while in a flat bed without using bedrails?: A Little Help needed moving from lying on your back to sitting on the side of a flat bed without using bedrails?: A Little Help needed moving to and from a bed to a chair (including a wheelchair)?: A Lot Help needed standing up from a chair using your arms (e.g., wheelchair or bedside chair)?: A Lot Help needed to walk in hospital room?: Total Help needed climbing 3-5 steps with a railing? : Total 6 Click Score: 12    End of Session Equipment Utilized During Treatment: Gait belt Activity Tolerance: Patient limited by pain Patient left: in bed;with call bell/phone within reach (on stretcher in ED) Nurse Communication: Mobility status PT Visit Diagnosis: Difficulty in walking, not elsewhere classified (R26.2);Pain Pain - Right/Left: Right Pain - part of body: Leg    Time: 3382-5053 PT Time  Calculation (min) (ACUTE ONLY): 19 min   Charges:   PT Evaluation $PT Eval Moderate Complexity: 1 Mod          Reuel Derby, PT, DPT  Acute Rehabilitation Services  Office: [phone]   Rudean Hitt 06/20/2022, 10:27 AM

## 2022-06-20 NOTE — ED Notes (Signed)
ED TO INPATIENT HANDOFF REPORT  ED Nurse Name and Phone #: Stanton Kidney, RN  S Name/Age/Gender Thomas Morgan 71 y.o. male Room/Bed: 037C/037C  Code Status   Code Status: Full Code  Home/SNF/Other Home Patient oriented to: self, place, time, and situation Is this baseline? Yes   Triage Complete: Triage complete  Chief Complaint Lumbar radiculopathy [M54.16]  Triage Note Pt from home BIB [facility] d/t chronic back pain, unable to ambulate tonight d/t pain, pt had recent back sx last Thursday, pain has been manageable until 10 pm last night. Pt denies any new injuries or straining his back. Pt reports sciatica like pain from right buttock down to his RLE.  VSS per EMS, GCS 15, 200 mcg Fentanyl IV in route, pt placed on 2L [place] d/t decrease sats after pain meds. 8 mg Zofran IV administer for nausea.    Allergies Allergies  Allergen Reactions   Hydrocodone Other (See Comments)    Palpitations, increased heart rate   Atorvastatin     Aching and feeling down   Crestor [Rosuvastatin] Other (See Comments)    Muscle aches   Lescol  [Fluvastatin]     Muscle Aches    Level of Care/Admitting Diagnosis ED Disposition     ED Disposition  Admit   Condition  --   Comment  Hospital Area: [facility] [[id number]]  Level of Care: Med-Surg [16]  May admit patient to [facility] or [facility] if equivalent level of care is available:: No  Covid Evaluation: Asymptomatic - no recent exposure (last 10 days) testing not required  Diagnosis: Lumbar radiculopathy [160737]  Admitting Physician: Vallarie Mare [[id number]]  Attending Physician: Vallarie Mare [[id number]]  Bed request comments: 4NP or 3W  Certification:: I certify this patient will need inpatient services for at least 2 midnights  Estimated Length of Stay: 2          B Medical/Surgery History Past Medical History:  Diagnosis Date   Basal cell carcinoma    Complication of anesthesia    Personal history of  tobacco use, presenting hazards to health 10/22/2015   PONV (postoperative nausea and vomiting)    gets very sick from anesthesia   Spinal stenosis    Past Surgical History:  Procedure Laterality Date   BACK SURGERY     BASAL CELL CARCINOMA EXCISION Left 2020   left arm   CYST REMOVAL TRUNK  2020   on his back   [place] Left 03/24/2017   Procedure: [place];  Surgeon: Sharlotte Alamo, DPM;  Location: [facility] ORS;  Morgan: Podiatry;  Laterality: Left;   FOOT SURGERY Right 04/16/2021   SKIN CANCER EXCISION     WRIST SURGERY Right    Trauma resulting in prosthetics in the right wrist and elbow     A IV Location/Drains/Wounds Patient Lines/Drains/Airways Status     Active Line/Drains/Airways     Name Placement date Placement time Site Days   Peripheral IV 06/18/22 20 G Posterior;Right Forearm 06/18/22  0427  Forearm  2   Incision (Closed) 03/24/17 Foot Left 03/24/17  0752  -- 1914            Intake/Output Last 24 hours  Intake/Output Summary (Last 24 hours) at 06/20/2022 1531 Last data filed at 06/20/2022 0537 Gross per 24 hour  Intake --  Output 700 ml  Net -700 ml    Labs/Imaging Results for orders placed or performed during the hospital  encounter of 06/18/22 (from the past 48 hour(s))  Magnesium     Status: None   Collection Time: 06/19/22  1:33 AM  Result Value Ref Range   Magnesium 2.1 1.7 - 2.4 mg/dL    Comment: Performed at [facility], [place] [address], [place], [place] [address]  Comprehensive metabolic panel     Status: Abnormal   Collection Time: 06/19/22  1:33 AM  Result Value Ref Range   Sodium 140 135 - 145 mmol/L   Potassium 4.0 3.5 - 5.1 mmol/L    Comment: DELTA CHECK NOTED   Chloride 106 98 - 111 mmol/L   CO2 25 22 - 32 mmol/L   Glucose, Bld 154 (H) 70 - 99 mg/dL    Comment: Glucose reference range applies only to samples taken after fasting for at least 8 hours.   BUN 17 8 - 23 mg/dL    Creatinine, Ser 0.78 0.61 - 1.24 mg/dL   Calcium 8.8 (L) 8.9 - 10.3 mg/dL   Total Protein 5.5 (L) 6.5 - 8.1 g/dL   Albumin 3.2 (L) 3.5 - 5.0 g/dL   AST 15 15 - 41 U/L   ALT 19 0 - 44 U/L   Alkaline Phosphatase 63 38 - 126 U/L   Total Bilirubin 0.5 0.3 - 1.2 mg/dL   GFR, Estimated >60 >60 mL/min    Comment: (NOTE) Calculated using the CKD-EPI Creatinine Equation (2021)    Anion gap 9 5 - 15    Comment: Performed at [facility], [place] [address] [place], [place] [address]  CBC with Differential/Platelet     Status: Abnormal   Collection Time: 06/19/22  1:33 AM  Result Value Ref Range   WBC 14.8 (H) 4.0 - 10.5 K/uL   RBC 4.17 (L) 4.22 - 5.81 MIL/uL   Hemoglobin 13.4 13.0 - 17.0 g/dL   HCT 39.7 39.0 - 52.0 %   MCV 95.2 80.0 - 100.0 fL   MCH 32.1 26.0 - 34.0 pg   MCHC 33.8 30.0 - 36.0 g/dL   RDW 13.3 11.5 - 15.5 %   Platelets 361 150 - 400 K/uL   nRBC 0.0 0.0 - 0.2 %   Neutrophils Relative % 90 %   Neutro Abs 13.4 (H) 1.7 - 7.7 K/uL   Lymphocytes Relative 7 %   Lymphs Abs 1.0 0.7 - 4.0 K/uL   Monocytes Relative 2 %   Monocytes Absolute 0.3 0.1 - 1.0 K/uL   Eosinophils Relative 0 %   Eosinophils Absolute 0.0 0.0 - 0.5 K/uL   Basophils Relative 0 %   Basophils Absolute 0.0 0.0 - 0.1 K/uL   Immature Granulocytes 1 %   Abs Immature Granulocytes 0.16 (H) 0.00 - 0.07 K/uL    Comment: Performed at [facility] [address] [place], [place] [address]   MR Lumbar Spine W Wo Contrast  Result Date: 06/20/2022 CLINICAL DATA:  Lumbar radiculopathy, prior surgery, new symptoms EXAM: MRI LUMBAR SPINE WITHOUT AND WITH CONTRAST TECHNIQUE: Multiplanar and multiecho pulse sequences of the lumbar spine were obtained without and with intravenous contrast. CONTRAST:  17m GADAVIST GADOBUTROL 1 MMOL/ML IV SOLN COMPARISON:  Lumbar spine MRI 03/16/2022 FINDINGS: Segmentation:  Standard. Alignment: Grade 1/2 anterolisthesis at L5-S1, similar to prior exam. Vertebrae: Chronic L5 pars  defects. No evidence of acute fracture, discitis, or aggressive osseous lesion. Postsurgical changes of recent posterior decompression at L3-L4. Conus medullaris and cauda equina: Conus extends to the T12-L1 level. Conus and cauda equina  appear normal. Paraspinal and other soft tissues: Partially visualized abdominal aortic aneurysm, which measures up to 4.2 cm in the sagittal plane, previously 4.1 cm. Disc levels: T11-T12: No significant spinal canal or neural foraminal narrowing. T12-L1: Tiny central protrusion.  No significant stenosis. L1-L2: Mild disc space narrowing and disc bulging. No significant stenosis. L2-L3: Mild disc space narrowing and disc bulging. No significant stenosis. L3-L4: Postsurgical changes of recent posterior decompression. There is mild facet arthropathy and facet effusions. There is mild disc bulging with superimposed central disc extrusion (sagittal T2 image 9). Mild crowding of the cauda equina nerve roots just above the level of decompression. The spinal canal is otherwise patent. There is moderate right and mild left neural foraminal stenosis. There is no enhancing epidural collection. There is mild posterior paraspinal edema and areas of enhancement which is likely postsurgical. New bilateral facet effusions. L4-L5: Disc space narrowing with mild disc bulging, bilateral facet arthropathy and a left sided facet effusion. Mild spinal canal stenosis there is moderate to severe left-sided neural foraminal stenosis which likely impinges the exiting L4 nerve root. Mild right-sided neural foraminal narrowing. This is similar to prior exam. L5-S1: Chronic L5 pars defects with grade 1/2 anterolisthesis. Disc space narrowing with mild bulging and endplate spurring, similar to prior. There is severe bilateral neural foraminal stenosis, likely impinging the exiting nerve roots. IMPRESSION: Postsurgical changes of recent posterior decompression at L3-L4. Mild crowding of the cauda equina nerve  roots just above the level of decompression in part related to disc bulging and small central disc extrusion and bilateral facet arthropathy. Otherwise patent spinal canal at this level with moderate right and mild left neural foraminal stenosis. New bilateral facet effusions, possibly reactive. Paraspinal soft tissue changes related to recent surgery without evidence of soft tissue abscess or epidural collection. Moderate to severe left-sided neural foraminal stenosis at L4-L5, likely impinging the exiting L4 nerve root. Mild spinal canal stenosis, right-sided neural foraminal narrowing, and new left-sided facet effusion at this level Chronic L5 pars defects with grade 1/2 anterolisthesis at L5-S1. Severe bilateral neural foraminal stenosis at this level, likely impinging the exiting nerve roots. Abdominal aortic aneurysm measures 4.2 cm in the sagittal plane, previously 4.1 cm. Recommend follow-up every 12 months and vascular consultation. This recommendation follows ACR consensus guidelines: White Paper of the ACR Incidental Findings Committee II on Vascular Findings. J Am Coll Radiol 2013; 10:789-794. Electronically Signed   By: Maurine Simmering M.D.   On: 06/20/2022 15:01    Pending Labs Unresulted Labs (From admission, onward)    None       Vitals/Pain Today's Vitals   06/20/22 1145 06/20/22 1145 06/20/22 1200 06/20/22 1441  BP: 136/73  (!) 140/79 128/73  Pulse: 67  (!) 57 64  Resp: '15  17 16  '$ Temp:  97.9 F (36.6 C)    TempSrc:  Oral    SpO2: 96%  97% 96%  Weight:      Height:      PainSc:        Isolation Precautions No active isolations  Medications Medications  sodium chloride flush (NS) 0.9 % injection 3 mL (3 mLs Intravenous Not Given 06/20/22 0917)  0.9 %  sodium chloride infusion ( Intravenous New Bag/Given 06/20/22 1142)  acetaminophen (TYLENOL) tablet 650 mg (has no administration in time range)    Or  acetaminophen (TYLENOL) suppository 650 mg (has no administration in  time range)  oxyCODONE (Oxy IR/ROXICODONE) immediate release tablet 5 mg (5 mg Oral Given  06/19/22 0923)  methocarbamol (ROBAXIN) 500 mg in dextrose 5 % 50 mL IVPB (has no administration in time range)  docusate sodium (COLACE) capsule 100 mg (100 mg Oral Given 06/20/22 0916)  polyethylene glycol (MIRALAX / GLYCOLAX) packet 17 g (has no administration in time range)  bisacodyl (DULCOLAX) suppository 10 mg (has no administration in time range)  sodium phosphate (FLEET) 7-19 GM/118ML enema 1 enema (has no administration in time range)  ondansetron (ZOFRAN) tablet 4 mg (has no administration in time range)    Or  ondansetron (ZOFRAN) injection 4 mg (has no administration in time range)  fentaNYL (SUBLIMAZE) injection 12.5-50 mcg (50 mcg Intravenous Given 06/20/22 0918)  ketorolac (TORADOL) 15 MG/ML injection 15 mg (15 mg Intravenous Given 06/20/22 1325)  pantoprazole (PROTONIX) EC tablet 40 mg (40 mg Oral Given 06/20/22 0916)  gabapentin (NEURONTIN) capsule 300 mg (300 mg Oral Given 06/20/22 1325)  dexamethasone (DECADRON) injection 4 mg (4 mg Intravenous Given 06/20/22 1140)  morphine (PF) 4 MG/ML injection 4 mg (4 mg Intravenous Given 06/18/22 0532)  acetaminophen (TYLENOL) tablet 1,000 mg (1,000 mg Oral Given 06/18/22 0531)  metoCLOPramide (REGLAN) injection 10 mg (10 mg Intravenous Given 06/18/22 0532)  diphenhydrAMINE (BENADRYL) injection 25 mg (25 mg Intravenous Given 06/18/22 0532)  potassium chloride 10 mEq in 100 mL IVPB (0 mEq Intravenous Stopped 06/18/22 1157)  HYDROmorphone (DILAUDID) injection 0.5 mg (0.5 mg Intravenous Given 06/18/22 0934)  methylPREDNISolone sodium succinate (SOLU-MEDROL) 125 mg/2 mL injection 60 mg (60 mg Intravenous Given 06/18/22 1204)  oxyCODONE (Oxy IR/ROXICODONE) immediate release tablet 5 mg (5 mg Oral Given 06/18/22 1458)  potassium chloride SA (KLOR-CON M) CR tablet 40 mEq (40 mEq Oral Given 06/18/22 2122)  methocarbamol (ROBAXIN) 500 mg in dextrose 5 % 50 mL IVPB (0 mg  Intravenous Stopped 06/18/22 2207)  gadobutrol (GADAVIST) 1 MMOL/ML injection 8 mL (8 mLs Intravenous Contrast Given 06/20/22 1410)    Mobility non-ambulatory Low fall risk   Focused Assessments Neuro Assessment Handoff:  Swallow screen pass? Yes  Cardiac Rhythm: Normal sinus rhythm       Neuro Assessment: Within Defined Limits Neuro Checks:      Last Documented NIHSS Modified Score:   Has TPA been given? No If patient is a Neuro Trauma and patient is going to OR before floor call report to [place] nurse: [phone] or [phone]   R Recommendations: See Admitting Provider Note  Report given to:   Additional Notes: pt is AAOx4. Pt is on RA.

## 2022-06-21 MED ORDER — GABAPENTIN 300 MG PO CAPS
600.0000 mg | ORAL_CAPSULE | Freq: Three times a day (TID) | ORAL | Status: DC
  Administered 2022-06-21 – 2022-07-04 (×39): 600 mg via ORAL
  Filled 2022-06-21 (×40): qty 2

## 2022-06-21 MED ORDER — OXYCODONE HCL 5 MG PO TABS
10.0000 mg | ORAL_TABLET | ORAL | Status: DC | PRN
  Administered 2022-06-21 – 2022-06-22 (×2): 10 mg via ORAL
  Filled 2022-06-21 (×2): qty 2

## 2022-06-21 MED ORDER — KETOROLAC TROMETHAMINE 15 MG/ML IJ SOLN
30.0000 mg | Freq: Three times a day (TID) | INTRAMUSCULAR | Status: AC
  Administered 2022-06-21 – 2022-06-23 (×6): 30 mg via INTRAVENOUS
  Filled 2022-06-21 (×6): qty 2

## 2022-06-21 NOTE — Progress Notes (Signed)
Physical Therapy Treatment Patient Details Name: Thomas Morgan MRN: [id number] DOB: 05/06/1951 Today's Date: 06/21/2022   History of Present Illness Pt is a 71 y/o male admitted secondary to increased back and RLE pain. Pt with recent laminectomy on 8/17. PMH includes basal cell carcinoma.    PT Comments    Pt received in supine, c/o RLE radiating pain despite premedication with PO meds prior to session. Pt agreeable to supine BLE exercises, instruction on log rolling (pt tolerating roll to L side), pressure relief strategies in supine given prolonged bed rest, pain reduction techniques (increased mobility/repositioning and use of cryotherapy), and back precautions. Pt unable to state back precautions when prompted so handout printed to reinforce and pt needing teachback for compliance with rolling. Pt continues to benefit from PT services to progress toward functional mobility goals.    Recommendations for follow up therapy are one component of a multi-disciplinary discharge planning process, led by the attending physician.  Recommendations may be updated based on patient status, additional functional criteria and insurance authorization.  Follow Up Recommendations  Other (comment) (TBD pending progression)     Assistance Recommended at Discharge Intermittent Supervision/Assistance  Patient can return home with the following A little help with walking and/or transfers;A little help with bathing/dressing/bathroom;Assistance with cooking/housework;Help with stairs or ramp for entrance;Assist for transportation   Equipment Recommendations  Other (comment) (TBD)    Recommendations for Other Services       Precautions / Restrictions Precautions Precautions: Back Precaution Booklet Issued: Yes (comment) Precaution Comments: back precs and HEP handouts given Restrictions Weight Bearing Restrictions: No     Mobility  Bed Mobility Overal bed mobility: Needs Assistance Bed Mobility:  Rolling Rolling: Supervision         General bed mobility comments: max cues for improved technique with log roll with fair carrryover; handout to reinforce no twisting with rolling. pt refusing to attempt long sit or log roll transfer to EOB due to pain    Transfers                   General transfer comment: unable secondary to pain       Balance Overall balance assessment: Mild deficits observed, not formally tested                                          Cognition Arousal/Alertness: Awake/alert Behavior During Therapy: WFL for tasks assessed/performed Overall Cognitive Status: Within Functional Limits for tasks assessed                                 General Comments: Pt frustrated by continued RLE pain post-op, initially not able to state 3/3 back precs or demo back with supine log roll so handout given to reinforce. Pt appreciative but defers EOB/OOB until after discussing plan from MRI done yesterday with MD        Exercises Other Exercises Other Exercises: supine BLE AROM: ankle pumps, heel slides (painful so decreased reps), SLR (in pain-free range on R), hip abd, glute sets, SAQ (with HOB up feet lowered during set) x10 reps ea Other Exercises: [email or web address]  Access Code: [email or web address] also reviewed posterior pelvic tilt but pt defers until next session as he already partially rolled to L side to offload R LB    General Comments General comments (skin  integrity, edema, etc.): spouse present. VSS on RA. Pt medicated ~80 mins prior to session per RN. pt also instructed on pressure relief strategies/frequency q2H in supine      Pertinent Vitals/Pain Pain Assessment Pain Assessment: Faces Faces Pain Scale: Hurts whole lot Pain Location: RLE radiating pain Pain Descriptors / Indicators: Grimacing, Guarding, Shooting, Radiating, Burning Pain Intervention(s): Limited activity within patient's tolerance, Monitored during  session, Premedicated before session, Repositioned, Ice applied, Utilized relaxation techniques           PT Goals (current goals can now be found in the care plan section) Acute Rehab PT Goals Patient Stated Goal: to decrease pain PT Goal Formulation: With patient Time For Goal Achievement: 07/04/22 Progress towards PT goals: Progressing toward goals (slow progress, cooperating in supine level exercises)    Frequency    Min 3X/week      PT Plan Current plan remains appropriate       AM-PAC PT "6 Clicks" Mobility   Outcome Measure  Help needed turning from your back to your side while in a flat bed without using bedrails?: A Little Help needed moving from lying on your back to sitting on the side of a flat bed without using bedrails?: Total (pt currently unable due to pain) Help needed moving to and from a bed to a chair (including a wheelchair)?: Total Help needed standing up from a chair using your arms (e.g., wheelchair or bedside chair)?: Total Help needed to walk in hospital room?: Total Help needed climbing 3-5 steps with a railing? : Total 6 Click Score: 8    End of Session   Activity Tolerance: Patient limited by pain Patient left: in bed;with call bell/phone within reach;with family/visitor present (ice pack to R lower back and pillows to offload R side) Nurse Communication: Mobility status;Other (comment);Precautions (poor pain tolerance) PT Visit Diagnosis: Difficulty in walking, not elsewhere classified (R26.2);Pain Pain - Right/Left: Right Pain - part of body: Leg     Time: 1111-1145 PT Time Calculation (min) (ACUTE ONLY): 34 min  Charges:  $Therapeutic Exercise: 8-22 mins $Therapeutic Activity: 8-22 mins                     Cyrah Mclamb P., PTA Acute Rehabilitation Services Secure Chat Preferred 9a-5:30pm Office: [place] 06/21/2022, 11:56 AM

## 2022-06-21 NOTE — Care Management (Signed)
  Transition of Care [facility]) Screening Note   Patient Details  Name: Thomas Morgan Date of Birth: [birth year]   Transition of Care [facility]) CM/SW Contact:    Carles Collet, RN Phone Number: 06/21/2022, 4:24 PM    Transition of Care Department [facility]) has reviewed patient and we will continue to monitor patient advancement through interdisciplinary progression rounds.  Determination for level of assistance at DC unable to be determined. Current mobility limited by pain. Anticipate HH once pain manageable. No Hx of HH in [place].  TOC will continue to follow

## 2022-06-21 NOTE — Progress Notes (Signed)
Subjective: Patient reports doing well while limiting movement. With movement, patient continues to have severe RLE radiculopathy that hinders his ability to mobilize and ambulate.   Objective: Vital signs in last 24 hours: Temp:  [97.8 F (36.6 C)-98.4 F (36.9 C)] 98.4 F (36.9 C) (08/29 0008) Pulse Rate:  [62-90] 90 (08/29 0008) Resp:  [15-18] 17 (08/29 0008) BP: (127-135)/(68-77) 127/77 (08/29 0008) SpO2:  [96 %-99 %] 97 % (08/29 0008)  Intake/Output from previous day: No intake/output data recorded. Intake/Output this shift: No intake/output data recorded.  Physical Exam: Patient is awake, A/O X 4, and conversant. Eyes open spontaneously. They are in NAD and VSS. Speech is fluent and appropriate. MAEW with good strength that is symmetrical. Sensation to light touch is intact. PERLA, EOMI. CNs grossly intact. Incision is well approximated with no drainage, erythema, or fluctuance.  Lab Results: Recent Labs    06/19/22 0133  WBC 14.8*  HGB 13.4  HCT 39.7  PLT 361   BMET Recent Labs    06/19/22 0133  NA 140  K 4.0  CL 106  CO2 25  GLUCOSE 154*  BUN 17  CREATININE 0.78  CALCIUM 8.8*    Studies/Results: MR Lumbar Spine W Wo Contrast  Result Date: 06/20/2022 CLINICAL DATA:  Lumbar radiculopathy, prior surgery, new symptoms EXAM: MRI LUMBAR SPINE WITHOUT AND WITH CONTRAST TECHNIQUE: Multiplanar and multiecho pulse sequences of the lumbar spine were obtained without and with intravenous contrast. CONTRAST:  67m GADAVIST GADOBUTROL 1 MMOL/ML IV SOLN COMPARISON:  Lumbar spine MRI 03/16/2022 FINDINGS: Segmentation:  Standard. Alignment: Grade 1/2 anterolisthesis at L5-S1, similar to prior exam. Vertebrae: Chronic L5 pars defects. No evidence of acute fracture, discitis, or aggressive osseous lesion. Postsurgical changes of recent posterior decompression at L3-L4. Conus medullaris and cauda equina: Conus extends to the T12-L1 level. Conus and cauda equina appear normal.  Paraspinal and other soft tissues: Partially visualized abdominal aortic aneurysm, which measures up to 4.2 cm in the sagittal plane, previously 4.1 cm. Disc levels: T11-T12: No significant spinal canal or neural foraminal narrowing. T12-L1: Tiny central protrusion.  No significant stenosis. L1-L2: Mild disc space narrowing and disc bulging. No significant stenosis. L2-L3: Mild disc space narrowing and disc bulging. No significant stenosis. L3-L4: Postsurgical changes of recent posterior decompression. There is mild facet arthropathy and facet effusions. There is mild disc bulging with superimposed central disc extrusion (sagittal T2 image 9). Mild crowding of the cauda equina nerve roots just above the level of decompression. The spinal canal is otherwise patent. There is moderate right and mild left neural foraminal stenosis. There is no enhancing epidural collection. There is mild posterior paraspinal edema and areas of enhancement which is likely postsurgical. New bilateral facet effusions. L4-L5: Disc space narrowing with mild disc bulging, bilateral facet arthropathy and a left sided facet effusion. Mild spinal canal stenosis there is moderate to severe left-sided neural foraminal stenosis which likely impinges the exiting L4 nerve root. Mild right-sided neural foraminal narrowing. This is similar to prior exam. L5-S1: Chronic L5 pars defects with grade 1/2 anterolisthesis. Disc space narrowing with mild bulging and endplate spurring, similar to prior. There is severe bilateral neural foraminal stenosis, likely impinging the exiting nerve roots. IMPRESSION: Postsurgical changes of recent posterior decompression at L3-L4. Mild crowding of the cauda equina nerve roots just above the level of decompression in part related to disc bulging and small central disc extrusion and bilateral facet arthropathy. Otherwise patent spinal canal at this level with moderate right and mild left  neural foraminal stenosis. New  bilateral facet effusions, possibly reactive. Paraspinal soft tissue changes related to recent surgery without evidence of soft tissue abscess or epidural collection. Moderate to severe left-sided neural foraminal stenosis at L4-L5, likely impinging the exiting L4 nerve root. Mild spinal canal stenosis, right-sided neural foraminal narrowing, and new left-sided facet effusion at this level Chronic L5 pars defects with grade 1/2 anterolisthesis at L5-S1. Severe bilateral neural foraminal stenosis at this level, likely impinging the exiting nerve roots. Abdominal aortic aneurysm measures 4.2 cm in the sagittal plane, previously 4.1 cm. Recommend follow-up every 12 months and vascular consultation. This recommendation follows ACR consensus guidelines: White Paper of the ACR Incidental Findings Committee II on Vascular Findings. J Am Coll Radiol 2013; 10:789-794. Electronically Signed   By: Maurine Simmering M.D.   On: 06/20/2022 15:01    Assessment/Plan: 71 y.o. male who is s/p open lumbar 3/4 laminectomy on 06/09/2022 with Dr. Reatha Armour. He was initially recovering well until he had an acute onset of low back pain and RLE radiculopathy. CT lumbar spine reviewed. No acute findings were appreciated. Admitted for pain control due to severity of pain and inability to independently ambulate secondary to the pain. Despite aggressive management which has consisted of PO and IV analgesics, gabapentin, muscle relaxers, and steroids, the patient does not feel that his pain has improved. MRI was performed and revealed no acute process or surgical complications. 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 Since he has had no improvement and his surgical site appears as expected, I have recommended we try an epidural steroid injection to target the foraminal stenosis and  impinged nerve root on the right at L5/S1.    -Pain control -PT/OT -Right-ward directed L5/S1 interlaminar ESI        LOS: 3 days     Marvis Moeller, DNP, AGNP-C Neurosurgery Nurse Practitioner  [facility] [place]. [address], [place], [place] [address] P: [phone]    F: [phone]  06/21/2022 11:37 AM

## 2022-06-21 NOTE — Care Management Important Message (Signed)
Important Message  Patient Details  Name: Thomas Morgan MRN: [id number] Date of Birth: 1951/05/25   Medicare Important Message Given:  Yes     Orbie Pyo 06/21/2022, 3:40 PM

## 2022-06-22 MED ORDER — METHOCARBAMOL 1000 MG/10ML IJ SOLN
500.0000 mg | Freq: Three times a day (TID) | INTRAVENOUS | Status: DC
  Administered 2022-06-22 – 2022-06-30 (×19): 500 mg via INTRAVENOUS
  Filled 2022-06-22: qty 500
  Filled 2022-06-22 (×5): qty 5
  Filled 2022-06-22 (×2): qty 500
  Filled 2022-06-22: qty 5
  Filled 2022-06-22 (×2): qty 500
  Filled 2022-06-22: qty 5
  Filled 2022-06-22: qty 500
  Filled 2022-06-22: qty 5
  Filled 2022-06-22: qty 500
  Filled 2022-06-22 (×4): qty 5
  Filled 2022-06-22 (×8): qty 500
  Filled 2022-06-22 (×2): qty 5
  Filled 2022-06-22: qty 500

## 2022-06-22 MED ORDER — OXYCODONE HCL ER 10 MG PO T12A
10.0000 mg | EXTENDED_RELEASE_TABLET | Freq: Two times a day (BID) | ORAL | Status: DC
  Administered 2022-06-22 – 2022-07-04 (×25): 10 mg via ORAL
  Filled 2022-06-22 (×25): qty 1

## 2022-06-22 MED ORDER — OXYCODONE HCL 5 MG PO TABS
10.0000 mg | ORAL_TABLET | ORAL | Status: DC
  Administered 2022-06-22 – 2022-07-02 (×60): 10 mg via ORAL
  Filled 2022-06-22 (×60): qty 2

## 2022-06-22 NOTE — Plan of Care (Signed)
  Problem: Activity: Goal: Risk for activity intolerance will decrease Outcome: Progressing   Problem: Nutrition: Goal: Adequate nutrition will be maintained Outcome: Progressing   Problem: Coping: Goal: Level of anxiety will decrease Outcome: Progressing   

## 2022-06-22 NOTE — Progress Notes (Signed)
Subjective: Patient reports right lower extremity radiculopathy with movement persists unchanged. Pain impacts patients ability to ambulate. NAE ON.   Objective: Vital signs in last 24 hours: Temp:  [97.9 F (36.6 C)-98.7 F (37.1 C)] 98.7 F (37.1 C) (08/30 0539) Pulse Rate:  [80] 80 (08/29 2357) Resp:  [17-18] 18 (08/29 2357) BP: (130-163)/(76-86) 163/86 (08/30 0539) SpO2:  [97 %] 97 % (08/29 1919)  Intake/Output from previous day: No intake/output data recorded. Intake/Output this shift: No intake/output data recorded.  Physical Exam: Patient is awake, A/O X 4, and conversant. Eyes open spontaneously. They are in NAD and VSS. Speech is fluent and appropriate. MAEW with good strength that is symmetrical. Sensation to light touch is intact. PERLA, EOMI. CNs grossly intact. Incision is well approximated with no drainage, erythema, or fluctuance.  Lab Results: No results for input(s): "WBC", "HGB", "HCT", "PLT" in the last 72 hours. BMET No results for input(s): "NA", "K", "CL", "CO2", "GLUCOSE", "BUN", "CREATININE", "CALCIUM" in the last 72 hours.  Studies/Results: MR Lumbar Spine W Wo Contrast  Result Date: 06/20/2022 CLINICAL DATA:  Lumbar radiculopathy, prior surgery, new symptoms EXAM: MRI LUMBAR SPINE WITHOUT AND WITH CONTRAST TECHNIQUE: Multiplanar and multiecho pulse sequences of the lumbar spine were obtained without and with intravenous contrast. CONTRAST:  72m GADAVIST GADOBUTROL 1 MMOL/ML IV SOLN COMPARISON:  Lumbar spine MRI 03/16/2022 FINDINGS: Segmentation:  Standard. Alignment: Grade 1/2 anterolisthesis at L5-S1, similar to prior exam. Vertebrae: Chronic L5 pars defects. No evidence of acute fracture, discitis, or aggressive osseous lesion. Postsurgical changes of recent posterior decompression at L3-L4. Conus medullaris and cauda equina: Conus extends to the T12-L1 level. Conus and cauda equina appear normal. Paraspinal and other soft tissues: Partially visualized  abdominal aortic aneurysm, which measures up to 4.2 cm in the sagittal plane, previously 4.1 cm. Disc levels: T11-T12: No significant spinal canal or neural foraminal narrowing. T12-L1: Tiny central protrusion.  No significant stenosis. L1-L2: Mild disc space narrowing and disc bulging. No significant stenosis. L2-L3: Mild disc space narrowing and disc bulging. No significant stenosis. L3-L4: Postsurgical changes of recent posterior decompression. There is mild facet arthropathy and facet effusions. There is mild disc bulging with superimposed central disc extrusion (sagittal T2 image 9). Mild crowding of the cauda equina nerve roots just above the level of decompression. The spinal canal is otherwise patent. There is moderate right and mild left neural foraminal stenosis. There is no enhancing epidural collection. There is mild posterior paraspinal edema and areas of enhancement which is likely postsurgical. New bilateral facet effusions. L4-L5: Disc space narrowing with mild disc bulging, bilateral facet arthropathy and a left sided facet effusion. Mild spinal canal stenosis there is moderate to severe left-sided neural foraminal stenosis which likely impinges the exiting L4 nerve root. Mild right-sided neural foraminal narrowing. This is similar to prior exam. L5-S1: Chronic L5 pars defects with grade 1/2 anterolisthesis. Disc space narrowing with mild bulging and endplate spurring, similar to prior. There is severe bilateral neural foraminal stenosis, likely impinging the exiting nerve roots. IMPRESSION: Postsurgical changes of recent posterior decompression at L3-L4. Mild crowding of the cauda equina nerve roots just above the level of decompression in part related to disc bulging and small central disc extrusion and bilateral facet arthropathy. Otherwise patent spinal canal at this level with moderate right and mild left neural foraminal stenosis. New bilateral facet effusions, possibly reactive. Paraspinal  soft tissue changes related to recent surgery without evidence of soft tissue abscess or epidural collection. Moderate to severe left-sided  neural foraminal stenosis at L4-L5, likely impinging the exiting L4 nerve root. Mild spinal canal stenosis, right-sided neural foraminal narrowing, and new left-sided facet effusion at this level Chronic L5 pars defects with grade 1/2 anterolisthesis at L5-S1. Severe bilateral neural foraminal stenosis at this level, likely impinging the exiting nerve roots. Abdominal aortic aneurysm measures 4.2 cm in the sagittal plane, previously 4.1 cm. Recommend follow-up every 12 months and vascular consultation. This recommendation follows ACR consensus guidelines: White Paper of the ACR Incidental Findings Committee II on Vascular Findings. J Am Coll Radiol 2013; 10:789-794. Electronically Signed   By: Maurine Simmering M.D.   On: 06/20/2022 15:01    Assessment/Plan: 72 y.o. male who is s/p open lumbar 3/4 laminectomy on 06/09/2022 with Dr. Reatha Armour. He was initially recovering well until he had an acute onset of low back pain and RLE radiculopathy. CT lumbar spine reviewed. No acute findings were appreciated. Admitted for pain control due to severity of pain and inability to independently ambulate secondary to the pain. Despite aggressive management which has consisted of PO and IV analgesics, gabapentin, muscle relaxers, and steroids, the patient does not feel that his pain has improved. MRI was performed and revealed no acute process or surgical complications. 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 I have reccommended he try a right-ward directed L5/S1 interlaminar ESI as an outpatient. To better address pain control while an inpatient, we will add oxycodone extended release and put it as well as the oxycodone IR and Robaxin  on a schedule. We discussed SNF or CIR placement for continue convalescence, however, the patient has adamantly declined this and stated he wants to stay in the hospital due to his inability to independently ambulate secondary the the RLE pain.    -PT/OT -Decadron '4mg'$  IV Q6H -Oxycodone IR 10 mg PO Q4H -Oxycodone ER 10 mg PO Q12H -Fentanyl 12.5 - 50 mcg IV Q2H PRN for breakthrough pain -Toradol 30 mg IV Q8H -Robaxin 500 mg IVPB Q8H -Gabapentin 600 mg PO TID    LOS: 4 days     Marvis Moeller 06/22/2022, 8:37 AM

## 2022-06-22 NOTE — Plan of Care (Signed)

## 2022-06-22 NOTE — Plan of Care (Signed)

## 2022-06-23 NOTE — Progress Notes (Signed)
Subjective: Patient reports his RLE pain is unchanged despite changes to his medications. He was unable to work with PT today d/t the severity of his pain.   Objective: Vital signs in last 24 hours: Temp:  [97.7 F (36.5 C)-98.9 F (37.2 C)] 98 F (36.7 C) (08/31 1305) Pulse Rate:  [74-78] 78 (08/31 0410) Resp:  [16-18] 17 (08/31 1305) BP: (132-164)/(69-99) 132/73 (08/31 1305) SpO2:  [97 %] 97 % (08/31 0410)  Intake/Output from previous day: 08/30 0701 - 08/31 0700 In: 5733.5 [I.V.:5677.6; IV Piggyback:55.9] Out: 2995 [Urine:2995] Intake/Output this shift: No intake/output data recorded.  Physical Exam: Patient is awake, A/O X 4, and conversant. Eyes open spontaneously. They are in NAD and VSS. Speech is fluent and appropriate. MAEW with good strength that is symmetrical. Sensation to light touch is intact. PERLA, EOMI. CNs grossly intact. Incision is well approximated with no drainage, erythema, or fluctuance.  Lab Results: No results for input(s): "WBC", "HGB", "HCT", "PLT" in the last 72 hours. BMET No results for input(s): "NA", "K", "CL", "CO2", "GLUCOSE", "BUN", "CREATININE", "CALCIUM" in the last 72 hours.  Studies/Results: No results found.  Assessment/Plan: 71 y.o. male who is s/p open lumbar 3/4 laminectomy on 06/09/2022 with Dr. Reatha Armour. He was initially recovering well until he had an acute onset of low back pain and RLE radiculopathy. CT lumbar spine reviewed. No acute findings were appreciated. Admitted for pain control due to severity of pain and inability to independently ambulate secondary to the pain. Despite aggressive management which has consisted of PO and IV analgesics, gabapentin, muscle relaxers, and steroids, the patient does not feel that his pain has improved. MRI was performed and revealed no acute process or surgical complications. 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 Chronic L5  pars defect with L5 on S1 anterolisthesis and severe bilateral foraminal stenosis. I have reccommended he try a right-ward directed L5/S1 interlaminar ESI as an outpatient. Medications were scheduled and added yesterday. Unfortunately, the patient has had no change in his symptoms. I discussed with the patient and his wife that we are limited with our options as there is no acute surgery to offer this patient/. I encouraged him to work with PT as his immobility is likely exacerbating part of his symptoms. Patient and wife adamant about patient staying in the hospital and declined SNF or CIR placement for continue convalescence. Patient states he can't go home without being able to walk. His wife stated she is unable to care fore him in his current condition.     -PT/OT -Decadron '4mg'$  IV Q6H -Oxycodone IR 10 mg PO Q4H -Oxycodone ER 10 mg PO Q12H -Fentanyl 12.5 - 50 mcg IV Q2H PRN for breakthrough pain -Toradol 30 mg IV Q8H -Robaxin 500 mg IVPB Q8H -Gabapentin 600 mg PO TID  LOS: 5 days     Marvis Moeller, DNP, AGNP-C Neurosurgery Nurse Practitioner  [facility] [address], [place], [place] [address] P: [phone]    F: [phone]  06/23/2022 4:22 PM

## 2022-06-23 NOTE — Plan of Care (Signed)

## 2022-06-23 NOTE — Progress Notes (Signed)
PT Cancellation Note  Patient Details Name: Thomas Morgan MRN: [id number] DOB: [birth year]   Cancelled Treatment:    Reason Eval/Treat Not Completed: Pain limiting ability to participate - PT checked on pt x2, pt states "we aren't doing any physical therapy, I have seen the imaging and I need surgery. The pain medicine they are giving me isn't touching my pain, please tell my surgeon I need to schedule my surgery". Information conveyed to RN, PT to check back.   Stacie Glaze, PT DPT Acute Rehabilitation Services Pager [phone]  Office [phone]    Louis Matte 06/23/2022, 2:21 PM

## 2022-06-23 NOTE — Progress Notes (Signed)
OT Cancellation Note  Patient Details Name: Thomas Morgan MRN: [id number] DOB: 09-06-1951   Cancelled Treatment:    Reason Eval/Treat Not Completed: Pain limiting ability to participate (Pt declining therapy due to significant pain.)  [place], OTR/L Acute Rehab Office: [phone] 06/23/2022, 2:21 PM

## 2022-06-24 LAB — CBC
HCT: 38.9 % — ABNORMAL LOW (ref 39.0–52.0)
Hemoglobin: 13.6 g/dL (ref 13.0–17.0)
MCH: 32.2 pg (ref 26.0–34.0)
MCHC: 35 g/dL (ref 30.0–36.0)
MCV: 92.2 fL (ref 80.0–100.0)
Platelets: 323 10*3/uL (ref 150–400)
RBC: 4.22 MIL/uL (ref 4.22–5.81)
RDW: 13.1 % (ref 11.5–15.5)
WBC: 16.7 10*3/uL — ABNORMAL HIGH (ref 4.0–10.5)
nRBC: 0 % (ref 0.0–0.2)

## 2022-06-24 LAB — CREATININE, SERUM
Creatinine, Ser: 0.8 mg/dL (ref 0.61–1.24)
GFR, Estimated: >60 mL/min (ref >60)

## 2022-06-24 MED ORDER — ENOXAPARIN SODIUM 40 MG/0.4ML IJ SOSY
40.0000 mg | PREFILLED_SYRINGE | INTRAMUSCULAR | Status: DC
  Administered 2022-06-24 – 2022-07-04 (×11): 40 mg via SUBCUTANEOUS
  Filled 2022-06-24 (×11): qty 0.4

## 2022-06-24 MED ORDER — HYDROMORPHONE HCL 1 MG/ML IJ SOLN
1.0000 mg | Freq: Once | INTRAMUSCULAR | Status: AC
  Administered 2022-06-24: 1 mg via INTRAVENOUS
  Filled 2022-06-24: qty 1

## 2022-06-24 NOTE — Progress Notes (Signed)
Subjective: Patient reports no improvement in his symptoms. RLE pain persists with certain movements. NAE ON.  Objective: Vital signs in last 24 hours: Temp:  [97.9 F (36.6 C)-98.6 F (37 C)] 98.6 F (37 C) (09/01 0410) Pulse Rate:  [60-80] 80 (09/01 0410) Resp:  [16-18] 16 (09/01 0410) BP: (99-147)/(73-111) 147/111 (09/01 0410) SpO2:  [100 %] 100 % (09/01 0410)  Intake/Output from previous day: 08/31 0701 - 09/01 0700 In: -  Out: 2700 [Urine:2700] Intake/Output this shift: No intake/output data recorded.  Physical Exam: Patient is awake, A/O X 4, and conversant. Eyes open spontaneously. They are in NAD and VSS. Speech is fluent and appropriate. MAEW with good strength that is symmetrical. Sensation to light touch is intact. PERLA, EOMI. CNs grossly intact. Incision is well approximated with no drainage, erythema, or fluctuance. SLR negative. Negative Patrick's test. No tenderness to palpation over right greater trochanter.   Lab Results: No results for input(s): "WBC", "HGB", "HCT", "PLT" in the last 72 hours. BMET No results for input(s): "NA", "K", "CL", "CO2", "GLUCOSE", "BUN", "CREATININE", "CALCIUM" in the last 72 hours.  Studies/Results: No results found.  Assessment/Plan: 71 y.o. male who is s/p open lumbar 3/4 laminectomy on 06/09/2022 with Dr. Reatha Armour. He was initially recovering well until he had an acute onset of low back pain and RLE radiculopathy. CT lumbar spine reviewed. No acute findings were appreciated. Admitted for pain control due to severity of pain and inability to independently ambulate secondary to the pain. Despite aggressive management which has consisted of PO and IV analgesics, gabapentin, muscle relaxers, and steroids, the patient does not feel that his pain has improved. MRI was performed and revealed no acute process or surgical complications. 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 I have reccommended he try a right-ward directed L5/S1 interlaminar ESI as an outpatient. Despite multiple changes in his medications, he has yet to report any change in his symptoms. There is no acute surgery to offer this patient. I have continued to encourage him to work with PT as his immobility is likely exacerbating part of his symptoms. Patient and wife adamant about patient staying in the hospital and declined SNF or CIR placement for continue convalescence. Patient states he can't go home without being able to walk. His wife stated she is unable to care fore him in his current condition.      -PT/OT -Decadron '4mg'$  IV Q6H -Oxycodone IR 10 mg PO Q4H -Oxycodone ER 10 mg PO Q12H -Fentanyl 12.5 - 50 mcg IV Q2H PRN for breakthrough pain -Toradol 30 mg IV Q8H -Robaxin 500 mg IVPB Q8H -Gabapentin 600 mg PO TID    LOS: 6 days     Marvis Moeller 06/24/2022, 8:07 AM

## 2022-06-24 NOTE — Progress Notes (Signed)
Physical Therapy Treatment Patient Details Name: Thomas Morgan MRN: [id number] DOB: 1951/10/20 Today's Date: 06/24/2022   History of Present Illness Pt is a 71 y/o male admitted secondary to increased back and RLE pain. Pt with recent laminectomy on 8/17. PMH includes basal cell carcinoma.    PT Comments    Pt complaining of severe back with radiating RLE pain "to the bottom of my foot" with any bed mobility. Pt agreeable to bed-level exercise, and repositioning using bed functions for preventing secondary complications (pneumonia, pressure injury, debility). Pt tolerated limited but several hip and LE exercises, pt instructed to perform on his own to maintain strength and ROM. Pt left in chair position in bed.    Recommendations for follow up therapy are one component of a multi-disciplinary discharge planning process, led by the attending physician.  Recommendations may be updated based on patient status, additional functional criteria and insurance authorization.  Follow Up Recommendations  Skilled nursing-short term rehab (<3 hours/day) Can patient physically be transported by private vehicle: No   Assistance Recommended at Discharge Frequent or constant Supervision/Assistance  Patient can return home with the following A lot of help with walking and/or transfers;A lot of help with bathing/dressing/bathroom   Equipment Recommendations  Other (comment) (tbd)    Recommendations for Other Services       Precautions / Restrictions Precautions Precautions: Back Restrictions Weight Bearing Restrictions: No     Mobility  Bed Mobility Overal bed mobility: Needs Assistance             General bed mobility comments: pt pulled to sit to adjust positioning only, supervision for safety and cues for technique    Transfers                   General transfer comment: unable secondary to pain    Ambulation/Gait                   Stairs              Wheelchair Mobility    Modified Rankin (Stroke Patients Only)       Balance                                            Cognition Arousal/Alertness: Awake/alert Behavior During Therapy: WFL for tasks assessed/performed Overall Cognitive Status: Within Functional Limits for tasks assessed                                 General Comments: Pt is aware some behaviors are self-limiting ("I just stopped moving because everything hurt, I know that's not what I should do"), is eager to return to PLOF in discussions with PT today. Severity of pt's back pain is anxiety-producing        Exercises General Exercises - Lower Extremity Short Arc Quad: AROM, Both, 10 reps, Supine Long Arc Quad: AROM, Both, 5 reps, Seated (chair position in bed) Hip ABduction/ADduction: AROM, Both, 10 reps, Supine Straight Leg Raises: AROM, Left, 5 reps, Supine Other Exercises Other Exercises: minimal ROM posterior and anterior pelvic tilt to determine exacerbating motion - posterior pelvic tilt more problematic    General Comments        Pertinent Vitals/Pain Pain Assessment Pain Assessment: Faces Faces Pain Scale: Hurts whole lot Pain Location: RLE radiating  pain Pain Descriptors / Indicators: Shooting, Radiating, Burning, Stabbing Pain Intervention(s): Limited activity within patient's tolerance, Monitored during session, Repositioned    Home Living                          Prior Function            PT Goals (current goals can now be found in the care plan section) Acute Rehab PT Goals Patient Stated Goal: to decrease pain PT Goal Formulation: With patient Time For Goal Achievement: 07/04/22 Potential to Achieve Goals: Good Progress towards PT goals: Progressing toward goals    Frequency    Min 3X/week      PT Plan Current plan remains appropriate    Co-evaluation              AM-PAC PT "6 Clicks" Mobility   Outcome  Measure  Help needed turning from your back to your side while in a flat bed without using bedrails?: A Little Help needed moving from lying on your back to sitting on the side of a flat bed without using bedrails?: Total Help needed moving to and from a bed to a chair (including a wheelchair)?: Total Help needed standing up from a chair using your arms (e.g., wheelchair or bedside chair)?: Total Help needed to walk in hospital room?: Total Help needed climbing 3-5 steps with a railing? : Total 6 Click Score: 8    End of Session   Activity Tolerance: Patient limited by pain Patient left: in bed;with call bell/phone within reach;with family/visitor present (in chair position) Nurse Communication: Mobility status PT Visit Diagnosis: Difficulty in walking, not elsewhere classified (R26.2);Pain Pain - Right/Left: Right Pain - part of body: Leg     Time: 1136-1202 PT Time Calculation (min) (ACUTE ONLY): 26 min  Charges:  $Therapeutic Exercise: 8-22 mins $Therapeutic Activity: 8-22 mins                     Stacie Glaze, PT DPT Acute Rehabilitation Services Pager [phone]  Office [phone]    Thomas Morgan 06/24/2022, 1:13 PM

## 2022-06-25 NOTE — Progress Notes (Signed)
Continues to have severe right lower extremity radicular pain mostly consistent with a right-sided L5 radicular pattern.  Motor and sensory intact to direct testing.  Dressing dry.  Patient very uncomfortable with any movement however.  Patient is status post lumbar decompressive surgery from which she initially did well.  Patient now with severe right lower extremity radicular pain likely secondary to his L5-S1 spondylolisthesis and severe foraminal stenosis.  Continue efforts at pain control for now.  If patient fails to improve may have to consider decompression and fusion surgery at L5-S1.

## 2022-06-26 ENCOUNTER — Inpatient Hospital Stay (HOSPITAL_COMMUNITY): Payer: Medicare PPO

## 2022-06-26 LAB — SEDIMENTATION RATE: Sed Rate: 3 mm/hr (ref 0–16)

## 2022-06-26 NOTE — Progress Notes (Signed)
Pt was unable to get xrays done d/t pain. Neurosurgery was paged.

## 2022-06-26 NOTE — Progress Notes (Signed)
Subjective: Patient reports unchanged condition of back right hip and leg pain posterior thigh down to his right foot.  Objective: Vital signs in last 24 hours: Temp:  [97.8 F (36.6 C)-98.3 F (36.8 C)] 98.3 F (36.8 C) (09/03 0853) Pulse Rate:  [68-83] 83 (09/03 0853) Resp:  [17-20] 20 (09/03 0853) BP: (134-173)/(80-118) 148/80 (09/03 0853) SpO2:  [92 %-96 %] 95 % (09/03 0853)  Intake/Output from previous day: 09/02 0701 - 09/03 0700 In: 4851 [I.V.:4851] Out: 3750 [Urine:3750] Intake/Output this shift: No intake/output data recorded.  Strength 5 out of 5 but severe pain on hip flexor  Lab Results: Recent Labs    06/24/22 1056  WBC 16.7*  HGB 13.6  HCT 38.9*  PLT 323   BMET Recent Labs    06/24/22 1056  CREATININE 0.80    Studies/Results: No results found.  Assessment/Plan: Status post decompressive laminotomy looks like L4-5 L5-S1 fluid in his facet joints at 4 5 spondylolisthesis at L5-S1 patient probably needs an L4-S1 plate of no overt signs of infection but will check a sed rate anyway because his pain is so severe.  We will try to obtain flexion-extension films he will not be able to stand and flex for this but maybe he can do it from a recumbent position.  LOS: 8 days     Elaina Hoops 06/26/2022, 9:03 AM

## 2022-06-27 NOTE — NC FL2 (Signed)
[place] LEVEL OF CARE SCREENING TOOL     IDENTIFICATION  Patient Name: Thomas Morgan Birthdate: 1951/07/06 Sex: male Admission Date (Current Location): 06/18/2022  [facility] and [place] Number:  Herbalist and Address:  [facility], [place] [address] [place], [place] [address]      Provider Number: [id number]  Attending Physician Name and Address:  Vallarie Mare, MD  Relative Name and Phone Number:  Hassan Rowan [phone]    Current Level of Care: Hospital Recommended Level of Care: [place] Prior Approval Number:    Date Approved/Denied:   PASRR Number: [id number]  Discharge Plan: SNF    Current Diagnoses: Patient Active Problem List   Diagnosis Date Noted   Lumbar radiculopathy 06/18/2022   Abdominal aortic aneurysm without rupture ([place]) 05/02/2022   Leg pain 01/12/2021   PAD (peripheral artery disease) ([place]) 01/12/2021   Emphysema lung ([place]) 11/23/2020   Vitamin D deficiency 10/05/2020   Lumbar canal stenosis 11/27/2019   Aortic aneurysm ([place]) 03/17/2017   Coronary arteriosclerosis 12/12/2016   Atherosclerosis of aorta ([place]) 11/14/2016   Numbness and tingling of foot 10/08/2015   Allergic rhinitis 04/24/2015   Back pain, chronic 04/24/2015   Diabetes ([place]) 04/24/2015   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   GERD (gastroesophageal reflux disease) 04/24/2015   Hypertension 04/24/2015   Obesity 04/24/2015   History of adenomatous polyp of colon 04/24/2015   Vasomotor rhinitis 04/24/2015   Ventral hernia 05/25/2010   Hyperlipidemia 10/12/2009   Arthralgia of multiple joints 10/12/2009   Hypersomnia 08/22/2008   Psoriasis 08/22/2008   Hepatic steatosis 06/06/2007   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   Tobacco abuse 10/24/1968    Orientation RESPIRATION BLADDER Height & Weight     Self, Time, Situation, Place  Normal Continent Weight: 182 lb (82.6 kg) Height:  '5\' 10"'$   (177.8 cm)  BEHAVIORAL SYMPTOMS/MOOD NEUROLOGICAL BOWEL NUTRITION STATUS      Continent Diet (Please see discharge summary)  AMBULATORY STATUS COMMUNICATION OF NEEDS Skin   Extensive Assist Verbally Other (Comment) (Appropriate for ethnicity,dry,lumbar incision,non-tenting,wound incision LDAs,Incision)                       Personal Care Assistance Level of Assistance  Bathing, Feeding, Dressing Bathing Assistance: Maximum assistance Feeding assistance: Independent (able to feed self) Dressing Assistance: Maximum assistance     Functional Limitations Info  Sight, Hearing, Speech Sight Info: Adequate (WDL) Hearing Info: Adequate (WDL) Speech Info: Adequate (WDL)    [place]  PT (By licensed PT), OT (By licensed OT)     PT Frequency: 5x min weekly OT Frequency: 5x min weekly            Contractures Contractures Info: Not present    Additional Factors Info  Code Status, Allergies Code Status Info: FULL Allergies Info: Hydrocodone,Atorvastatin,Crestor (rosuvastatin),Lescol  (fluvastatin)           Current Medications (06/27/2022):  This is the current hospital active medication list Current Facility-Administered Medications  Medication Dose Route Frequency Provider Last Rate Last Admin   0.9 %  sodium chloride infusion   Intravenous Continuous Marvis Moeller, NP 75 mL/hr at 06/27/22 1040 Infusion Verify at 06/27/22 1040   acetaminophen (TYLENOL) tablet 650 mg  650 mg Oral Q6H PRN Marvis Moeller, NP       Or   acetaminophen (TYLENOL) suppository 650 mg  650 mg Rectal Q6H PRN Marvis Moeller,  NP       bisacodyl (DULCOLAX) suppository 10 mg  10 mg Rectal Daily PRN Marvis Moeller, NP       dexamethasone (DECADRON) injection 4 mg  4 mg Intravenous Q6H Fenton Malling L, NP   4 mg at 06/27/22 1132   docusate sodium (COLACE) capsule 100 mg  100 mg Oral BID Fenton Malling L, NP   100 mg at 06/27/22 0911   enoxaparin (LOVENOX)  injection 40 mg  40 mg Subcutaneous Q24H Fenton Malling L, NP   40 mg at 06/27/22 0911   fentaNYL (SUBLIMAZE) injection 12.5-50 mcg  12.5-50 mcg Intravenous Q2H PRN Marvis Moeller, NP   50 mcg at 06/25/22 2350   gabapentin (NEURONTIN) capsule 600 mg  600 mg Oral TID Vallarie Mare, MD   600 mg at 06/27/22 1338   methocarbamol (ROBAXIN) 500 mg in dextrose 5 % 50 mL IVPB  500 mg Intravenous Q8H Fenton Malling L, NP 110 mL/hr at 06/27/22 1340 500 mg at 06/27/22 1340   ondansetron (ZOFRAN) tablet 4 mg  4 mg Oral Q6H PRN Marvis Moeller, NP       Or   ondansetron (ZOFRAN) injection 4 mg  4 mg Intravenous Q6H PRN Marvis Moeller, NP       oxyCODONE (Oxy IR/ROXICODONE) immediate release tablet 10 mg  10 mg Oral Q4H Fenton Malling L, NP   10 mg at 06/27/22 1338   oxyCODONE (OXYCONTIN) 12 hr tablet 10 mg  10 mg Oral Q12H Fenton Malling L, NP   10 mg at 06/27/22 0911   pantoprazole (PROTONIX) EC tablet 40 mg  40 mg Oral Daily Vallarie Mare, MD   40 mg at 06/27/22 0911   polyethylene glycol (MIRALAX / GLYCOLAX) packet 17 g  17 g Oral Daily PRN Marvis Moeller, NP   17 g at 06/27/22 0925   sodium chloride flush (NS) 0.9 % injection 3 mL  3 mL Intravenous Q12H Fenton Malling L, NP   3 mL at 06/27/22 3762     Discharge Medications: Please see discharge summary for a list of discharge medications.  Relevant Imaging Results:  Relevant Lab Results:   Additional Information [id number],XTGG Covid Vaccines  Milas Gain, LCSWA

## 2022-06-27 NOTE — TOC Initial Note (Signed)
Transition of Care [facility]) - Initial/Assessment Note    Patient Details  Name: Thomas Morgan MRN: [id number] Date of Birth: 1951-03-26  Transition of Care [facility]) CM/SW Contact:    Milas Gain, [place] Phone Number: 06/27/2022, 2:32 PM  Clinical Narrative:                  CSW received consult for possible SNF placement at time of discharge. CSW spoke with patient and patients spouse at bedside regarding PT recommendation of SNF placement at time of discharge. Patient reports he comes from home with spouse. Patient expressed understanding of PT recommendation and is agreeable to SNF placement at time of discharge. Patient gave CSW permission to fax out initial referral near the [place] area.CSW discussed insurance authorization process and will provide medicare compare ratings list with accepted SNF bed offers when available.Patient reports he has received the COVID vaccines. No further questions reported at this time. CSW to continue to follow and assist with discharge planning needs.   Expected Discharge Plan: Skilled Nursing Facility Barriers to Discharge: Continued Medical Work up   Patient Goals and CMS Choice Patient states their goals for this hospitalization and ongoing recovery are:: SNF CMS Medicare.gov Compare Post Acute Care list provided to:: Patient Choice offered to / list presented to : Patient  Expected Discharge Plan and Services Expected Discharge Plan: [place]       Living arrangements for the past 2 months: [place]                                      Prior Living Arrangements/Services Living arrangements for the past 2 months: [place] Lives with:: Spouse Patient language and need for interpreter reviewed:: Yes Do you feel safe going back to the place where you live?: No   SNF  Need for Family Participation in Patient Care: Yes (Comment) Care giver support system in place?: Yes (comment)   Criminal  Activity/Legal Involvement Pertinent to Current Situation/Hospitalization: No - Comment as needed  Activities of Daily Living      Permission Sought/Granted Permission sought to share information with : Case Manager, Family Supports, Customer service manager Permission granted to share information with : Yes, Verbal Permission Granted  Share Information with NAME: Hassan Rowan  Permission granted to share info w AGENCY: SNF  Permission granted to share info w Relationship: spouse  Permission granted to share info w Contact Information: Hassan Rowan [phone]  Emotional Assessment Appearance:: Appears stated age Attitude/Demeanor/Rapport: Gracious Affect (typically observed): Calm Orientation: : Oriented to Self, Oriented to Place, Oriented to  Time, Oriented to Situation Alcohol / Substance Use: Not Applicable Psych Involvement: No (comment)  Admission diagnosis:  Lumbar radiculopathy [M54.16] Acute right-sided low back pain with bilateral sciatica [M54.42, M54.41] Acute low back pain, unspecified back pain laterality, unspecified whether sciatica present [M54.50] Patient Active Problem List   Diagnosis Date Noted   Lumbar radiculopathy 06/18/2022   Abdominal aortic aneurysm without rupture ([place]) 05/02/2022   Leg pain 01/12/2021   PAD (peripheral artery disease) ([place]) 01/12/2021   Emphysema lung ([place]) 11/23/2020   Vitamin D deficiency 10/05/2020   Lumbar canal stenosis 11/27/2019   Aortic aneurysm ([place]) 03/17/2017   Coronary arteriosclerosis 12/12/2016   Atherosclerosis of aorta ([place]) 11/14/2016   Numbness and tingling of foot 10/08/2015   Allergic rhinitis 04/24/2015   Back pain, chronic 04/24/2015   Diabetes ([place]) 04/24/2015  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 [place] [address] Phone: [phone] Fax: [phone]     Social Determinants of Health (SDOH) Interventions    Readmission Risk Interventions     No data to display

## 2022-06-27 NOTE — Progress Notes (Signed)
NEUROSURGERY PROGRESS NOTE  Doing ok, still has a lot of pain with any movement. Dr. Saintclair Halsted spoke with Dr. Reatha Armour and he is going to review the films and maybe plan for surgical intervention this week. Continue pain management for now. He was unable to do the upright xrays because of pain.   Temp:  [97.9 F (36.6 C)-98.5 F (36.9 C)] 97.9 F (36.6 C) (09/04 0820) Pulse Rate:  [66-88] 76 (09/04 0820) Resp:  [18] 18 (09/04 0820) BP: (137-161)/(75-95) 160/87 (09/04 0820) SpO2:  [94 %-96 %] 95 % (09/04 0358)   Thomas Chiquito, NP 06/27/2022 9:02 AM

## 2022-06-28 MED ORDER — DIAZEPAM 5 MG/ML IJ SOLN
5.0000 mg | Freq: Once | INTRAMUSCULAR | Status: AC
  Administered 2022-06-29: 5 mg via INTRAVENOUS
  Filled 2022-06-28: qty 2

## 2022-06-28 MED ORDER — HYDROMORPHONE HCL 1 MG/ML IJ SOLN
0.5000 mg | Freq: Once | INTRAMUSCULAR | Status: AC
  Administered 2022-06-29: 0.5 mg via INTRAVENOUS
  Filled 2022-06-28: qty 0.5

## 2022-06-28 NOTE — Progress Notes (Signed)
PT Cancellation Note  Patient Details Name: SOTIRIOS NAVARRO MRN: [id number] DOB: [birth year]   Cancelled Treatment:    Reason Eval/Treat Not Completed: (P) Pain limiting ability to participate (pt refusing any mobility or lower extremity exercise, he wants to wait until after MRI. Reviewed pressure relief strategies and importance of continued mobility, pt reports he has been mobilizing as able in bed but politely adamant against participating in PT.) Will continue to assess next date as schedule permits, if pt continues to refuse may consider signing off, pt has refused attempts x2 in past week and has not attempted OOB mobility with therapies.  Makell Cyr M Trestin Vences 06/28/2022, 2:57 PM

## 2022-06-28 NOTE — Progress Notes (Signed)
Occupational Therapy Treatment Patient Details Name: Thomas Morgan MRN: [id number] DOB: Jun 24, 1951 Today's Date: 06/28/2022   History of present illness Pt is a 71 y/o male admitted secondary to increased back and RLE pain. Pt with recent laminectomy on 8/17. PMH includes basal cell carcinoma.   OT comments  Pt continues to decline any attempt at OOB or in bed mobility 2/2 severe pain. He was agreeable to bed level therex via level 1 yellow theraband this session. 2 sets of 10 lat pull down, tricep extension, bicep curl with facilitation for proper muscle activation and form from OT. No increase in pain from these exercises which pt was pleased about. He was left with the theraband to continue HEP. OT will continue to follow and determine appropriate d/c plan pending pain management.    Recommendations for follow up therapy are one component of a multi-disciplinary discharge planning process, led by the attending physician.  Recommendations may be updated based on patient status, additional functional criteria and insurance authorization.    Follow Up Recommendations  No OT follow up    Assistance Recommended at Discharge Frequent or constant Supervision/Assistance     Equipment Recommendations  BSC/3in1    Recommendations for Other Services PT consult    Precautions / Restrictions Precautions Precautions: Back Restrictions Weight Bearing Restrictions: No        Extremity/Trunk Assessment Upper Extremity Assessment Upper Extremity Assessment: Generalized weakness            Cognition Arousal/Alertness: Awake/alert Behavior During Therapy: WFL for tasks assessed/performed Overall Cognitive Status: Within Functional Limits for tasks assessed                                          Exercises Exercises: General Upper Extremity, Other exercises General Exercises - Upper Extremity Shoulder Flexion: Strengthening, 10 reps, Supine, Theraband Theraband Level  (Shoulder Flexion): Level 1 (Yellow) Shoulder Extension: Strengthening, 10 reps, Supine, Theraband Theraband Level (Shoulder Extension): Level 1 (Yellow) Elbow Flexion: Strengthening, 10 reps, Both, Supine, Theraband Theraband Level (Elbow Flexion): Level 1 (Yellow) Elbow Extension: Strengthening, Both, 10 reps, Supine, Theraband Theraband Level (Elbow Extension): Level 1 (Yellow) Other Exercises Other Exercises: 2x10 lat pull downs bilaterally with level 1 theraband       General Comments Pt declining any attempt at OOB or in bed mobility. He was agreeable to bed level therex    Pertinent Vitals/ Pain       Pain Assessment Pain Assessment: 0-10 Pain Score: 3  Pain Location: RLE radiating pain Pain Descriptors / Indicators: Shooting, Radiating, Burning, Stabbing Pain Intervention(s): Limited activity within patient's tolerance         Frequency  Min 2X/week        Progress Toward Goals  OT Goals(current goals can now be found in the care plan section)  Progress towards OT goals: Progressing toward goals  Acute Rehab OT Goals Patient Stated Goal: "Figure out what's wrong" OT Goal Formulation: With patient/family Time For Goal Achievement: 07/04/22 Potential to Achieve Goals: Good  Plan Discharge plan remains appropriate       AM-PAC OT "6 Clicks" Daily Activity     Outcome Measure   Help from another person eating meals?: A Little Help from another person taking care of personal grooming?: A Little Help from another person toileting, which includes using toliet, bedpan, or urinal?: A Lot Help from another person bathing (including washing,  rinsing, drying)?: A Lot Help from another person to put on and taking off regular upper body clothing?: A Lot Help from another person to put on and taking off regular lower body clothing?: A Lot 6 Click Score: 14    End of Session    OT Visit Diagnosis: Muscle weakness (generalized) (M62.81);Pain;Other symptoms and signs  involving cognitive function;Other symptoms and signs involving the nervous system (R29.898) Pain - Right/Left: Right Pain - part of body: Leg   Activity Tolerance Patient limited by pain   Patient Left in bed;with call bell/phone within reach   Nurse Communication Mobility status        Time: 1140-1159 OT Time Calculation (min): 19 min  Charges: OT General Charges $OT Visit: 1 Visit OT Treatments $Therapeutic Exercise: 8-22 mins  Laverle Hobby, OTR/L, CBIS Acute Rehab Office: [place] 06/28/2022, 12:11 PM

## 2022-06-28 NOTE — Progress Notes (Signed)
   Providing Compassionate, Quality Care - Together  NEUROSURGERY PROGRESS NOTE   S: No issues overnight.  Continues to complain of significant right hip pain, radiating along his right posterior and lateral thigh, intermittently into his foot  O: EXAM:  BP (!) 151/80 (BP Location: Right Arm)   Pulse 74   Temp 98.5 F (36.9 C) (Oral)   Resp 18   Ht '5\' 10"'$  (1.778 m)   Wt 82.6 kg   SpO2 94%   BMI 26.11 kg/m   Awake, alert, oriented x3, mild distress due to pain PERRL Speech fluent, appropriate  CNs grossly intact  5/5 BUE/BLE  Screams in pain with internal rotation of the right hip No pain with internal/external rotation of left hip  ASSESSMENT:  71 y.o. 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  I do not believe he can tolerate x-rays due to his severe pain.  His MRI appears similar to prior in terms of his chronic spondylosis and stenosis at L4-5 and L5-S1 and I do not believe his pain is radicular in nature.  I discussed extensively with the patient and his wife at bedside, pending MRI findings we may have orthopedic consultation placed. -Continue pain control    Thank you for allowing me to participate in this patient's care.  Please do not hesitate to call with questions or concerns.   Elwin Sleight, [facility] Cell: [phone]

## 2022-06-29 ENCOUNTER — Inpatient Hospital Stay (HOSPITAL_COMMUNITY): Payer: Medicare PPO

## 2022-06-29 NOTE — TOC Progression Note (Signed)
Transition of Care [facility]) - Progression Note    Patient Details  Name: Thomas Morgan MRN: [id number] Date of Birth: July 15, 1951  Transition of Care [facility]) CM/SW [place], LCSW Phone Number: 06/29/2022, 4:57 PM  Clinical Narrative:    Patient sleeping soundly so CSW left SNF ratings list in his room. CSW contacted his spouse and made her aware of list as well and she stated they would review and hopefully figure out pain control so that patient will be able to participate in therapies.    Expected Discharge Plan: [place] Barriers to Discharge: Continued Medical Work up  Expected Discharge Plan and Services Expected Discharge Plan: [place] arrangements for the past 2 months:                                        Social Determinants of Health (SDOH) Interventions    Readmission Risk Interventions     No data to display

## 2022-06-29 NOTE — Progress Notes (Signed)
   Providing Compassionate, Quality Care - Together  NEUROSURGERY PROGRESS NOTE   S: No issues overnight.   O: EXAM:  BP 133/79 (BP Location: Right Arm)   Pulse 87   Temp 98.2 F (36.8 C) (Oral)   Resp 16   Ht '5\' 10"'$  (1.778 m)   Wt 82.6 kg   SpO2 94%   BMI 26.11 kg/m   Awake, alert, oriented x3, mild distress due to pain PERRL Speech fluent, appropriate  CNs grossly intact  5/5 BUE/BLE, limited proximal right lower extremity muscle examination due to pain, distal 5/5 DF/PF/EHL Severe pain with manipulation of the right hip No pain with internal/external rotation of left hip   ASSESSMENT:  71 y.o. male with    Lumbar spondylosis, L3-S1 Severe right hip and leg pain   PLAN: MRI hip reviewed, multiple small muscular tears and strains, will appreciate orthopedics evaluation.  I explained to the family that likely will still need many weeks of therapy and rest for improvement.  Continue pain control, PT evaluation.   Thank you for allowing me to participate in this patient's care.  Please do not hesitate to call with questions or concerns.   Elwin Sleight, [facility] Cell: [phone]

## 2022-06-29 NOTE — Progress Notes (Signed)
PT Cancellation Note  Patient Details Name: Thomas Morgan MRN: [id number] DOB: 01/09/1951   Cancelled Treatment:    Reason Eval/Treat Not Completed: (P) Fatigue/lethargy limiting ability to participate (pt recently worked with OT and c/o fatigue, agreeable to try PT session tomorrow. Plan to bring mechanical lift to room tomorrow for OOB to chair then try standing from chair posture.) Will continue efforts per PT plan of care as schedule permits, tomorrow before lunch time.   [name] M Riyansh Gerstner 06/29/2022, 3:28 PM

## 2022-06-29 NOTE — Progress Notes (Signed)
Occupational Therapy Treatment Patient Details Name: Thomas Morgan MRN: [id number] DOB: 08/29/1951 Today's Date: 06/29/2022   History of present illness Pt is a 71 y/o male admitted secondary to increased back and RLE pain. Pt with recent laminectomy on 8/17. PMH includes basal cell carcinoma.   OT comments  Pt in bed upon therapy arrival. Agreeable to participate in OT treatment session with focus on establishing a new lower intensity HEP to help decrease pain level and allow for greater bed mobility in order to progress to transferring out of bed. Education provided to both patient and wife on HEP, back precautions, log roll technique, and helpful positioning techniques to utilizes when completing bed mobility and when sleeping to decrease pressure felt in lumber region. Pt did experience intermittent pain during session, and was able to carry over education utilizing abdominal contractions and relaxation techniques to manage. Pt able to demonstrate more BLE movement this date than he has at previous session. Would recommend continuing with completing HEP prior to attempting bed mobility in order to progress to sitting on EOB with therapy.   Recommendations for follow up therapy are one component of a multi-disciplinary discharge planning process, led by the attending physician.  Recommendations may be updated based on patient status, additional functional criteria and insurance authorization.    Follow Up Recommendations  No OT follow up    Assistance Recommended at Discharge Frequent or constant Supervision/Assistance  Patient can return home with the following  Two people to help with walking and/or transfers;Two people to help with bathing/dressing/bathroom;Assistance with cooking/housework;Help with stairs or ramp for entrance;Assist for transportation   Equipment Recommendations  BSC/3in1       Precautions / Restrictions Precautions Precautions: Back Precaution Comments: reviewed back  precautions and log roll technique in bed Restrictions Weight Bearing Restrictions: No       Mobility Bed Mobility Overal bed mobility: Needs Assistance Bed Mobility: Rolling Rolling: Min guard         General bed mobility comments: educated on form and technique to log roll right and left. Pillow placed between knees with knees slightly flexed. Tactile cues provided to pt to move hips and shoulders as one unit versus reaching UEs and twisting. Pt assisted with boosting towards HOB using headboard and bed positioned in [place] to assist; Min Assist needed to boost. Patient Response: Cooperative         ADL either performed or assessed with clinical judgement      Cognition Arousal/Alertness: Awake/alert Behavior During Therapy: WFL for tasks assessed/performed Overall Cognitive Status: Within Functional Limits for tasks assessed          Exercises General Exercises - Lower Extremity Ankle Circles/Pumps: AROM, Both, 10 reps, Supine Heel Slides: AROM, Both, 10 reps, Supine Straight Leg Raises: AROM, Both, 5 reps, Supine, Other (comment) (educated to raise 6-12 inches total. Hold no more than 5 seconds each) Other Exercises Other Exercises: Supine, abdominal contractions, 1 set, 10X, 5" hold each            Pertinent Vitals/ Pain       Pain Assessment Pain Assessment: Faces Faces Pain Scale: Hurts whole lot Pain Location: Intermittently experienced pain in RLE (radiating pain) Pain Descriptors / Indicators: Radiating, Shooting, Burning Pain Intervention(s): Monitored during session, Repositioned, Limited activity within patient's tolerance, Other (comment) (educated on utilizing abdominal contractions)         Frequency  Min 2X/week        Progress Toward Goals  OT Goals(current goals can  now be found in the care plan section)  Progress towards OT goals: Progressing toward goals     Plan Discharge plan remains appropriate;Frequency remains  appropriate       AM-PAC OT "6 Clicks" Daily Activity     Outcome Measure   Help from another person eating meals?: A Little Help from another person taking care of personal grooming?: A Little Help from another person toileting, which includes using toliet, bedpan, or urinal?: A Lot Help from another person bathing (including washing, rinsing, drying)?: A Lot Help from another person to put on and taking off regular upper body clothing?: A Lot Help from another person to put on and taking off regular lower body clothing?: A Lot 6 Click Score: 14    End of Session    OT Visit Diagnosis: Muscle weakness (generalized) (M62.81);Pain;Other symptoms and signs involving cognitive function;Other symptoms and signs involving the nervous system (R29.898) Pain - Right/Left: Right Pain - part of body: Leg   Activity Tolerance Patient tolerated treatment well   Patient Left in bed;with call bell/phone within reach;with bed alarm set;with family/visitor present           Time: 3612-2449 OT Time Calculation (min): 64 min  Charges: OT General Charges $OT Visit: 1 Visit OT Treatments $Therapeutic Exercise: 53-67 mins Ailene Ravel, OTR/L,CBIS  Supplemental OT - [facility] and [facility]   Pegi Milazzo, Clarene Duke 06/29/2022, 3:03 PM

## 2022-06-29 NOTE — Consult Note (Signed)
Reason for Consult:Right hip pain Referring Physician: Pieter Partridge Dawley Time called: 1303 Time at bedside: [name] is an 70 y.o. male.  HPI: Thomas Morgan was recovering from lumbar laminectomy on 8/17 and was doing well. About 10d was getting into bed when he developed searing pain in right hip and leg. He has been unable to bear weight or even stand since. He describes his pain as primarily in the posterior right hip. It's constant in nature at a mild level but increases to severe with almost any movement. MRI showed some minimal muscle tears and moderate hip arthritis (though essentially equal bilaterally) and orthopedic surgery was asked to consult. NS does not feel this represents radicular pain.   Past Medical History:  Diagnosis Date   Basal cell carcinoma    Complication of anesthesia    Personal history of tobacco use, presenting hazards to health 10/22/2015   PONV (postoperative nausea and vomiting)    gets very sick from anesthesia   Spinal stenosis     Past Surgical History:  Procedure Laterality Date   BACK SURGERY     BASAL CELL CARCINOMA EXCISION Left 2020   left arm   CYST REMOVAL TRUNK  2020   on his back   [place] Left 03/24/2017   Procedure: EXCISION MORTON'S NEUROMA;  Surgeon: Sharlotte Alamo, DPM;  Location: [facility] ORS;  Service: Podiatry;  Laterality: Left;   FOOT SURGERY Right 04/16/2021   SKIN CANCER EXCISION     WRIST SURGERY Right    Trauma resulting in prosthetics in the right wrist and elbow    Family History  Problem Relation Age of Onset   Cancer Mother        Lung   Hypertension Mother    Heart Problems Father    AAA (abdominal aortic aneurysm) Father    Heart attack Brother    Hypercholesterolemia Brother     Social History:  reports that he has been smoking cigarettes. He has a 48.00 pack-year smoking history. He has never used smokeless tobacco. He reports that he does not drink alcohol and does not use  drugs.  Allergies:  Allergies  Allergen Reactions   Hydrocodone Other (See Comments)    Palpitations, increased heart rate   Atorvastatin     Aching and feeling down   Crestor [Rosuvastatin] Other (See Comments)    Muscle aches   Lescol  [Fluvastatin]     Muscle Aches    Medications: I have reviewed the patient's current medications.  No results found for this or any previous visit (from the past 48 hour(s)).  MR HIP RIGHT WO CONTRAST  Result Date: 06/29/2022 CLINICAL DATA:  Hip trauma. Fracture suspected. Status post L3-4 laminectomy [id number]. Yesterday twisted on bed with onset of severe lower back pain radiating into the right buttock and down right leg. Unable to ambulate. EXAM: MR OF THE RIGHT HIP WITHOUT CONTRAST TECHNIQUE: Multiplanar, multisequence MR imaging was performed. No intravenous contrast was administered. COMPARISON:  CT abdomen pelvis without contrast 06/18/2022 FINDINGS: Bones: No acute fracture or avascular necrosis is seen within the visualized the pelvis or either proximal femur. Articular cartilage and labrum Right hip: Articular cartilage: There is moderate anterior superior and posterosuperior right femoral head and acetabular cartilage thinning. Moderate to high-grade posterosuperior quadrant of the right acetabulum subchondral degenerative cystic change. Mild right femoral head-neck junction circumferential degenerative osteophytes. 9 mm anterior superior right femoral neck subchondral decreased T1 and predominantly  increased T2 signal cystic lesion with thin internal septations and additional low signal superior likely calcification. This may represent degenerative subchondral cyst versus enchondroma. It has benign features. Labrum: There is moderate attenuation and degenerative irregularity of the anterior superior right acetabular labrum. Left hip: Articular cartilage: On large field-of-view images, there is moderate thinning of the left femoroacetabular cartilage.  Mild-to-moderate superior left acetabular subchondral degenerative cystic change. Moderate subchondral degenerative cystic changes within the posterosuperior quadrant of the left femoral head-neck junction. Labrum: Mild degenerative irregularity of the superior left acetabular labrum on large field-of-view images. Joint or bursal effusion Joint effusion:  No joint effusion within either hip. Bursae: No trochanteric bursitis. Muscles and tendons Muscles and tendons: The origins of the bilateral sartorius and rectus femoris tendons are intact. Mild right common hamstring origin intermediate T2 signal tendinosis with mild marrow edema within the adjacent right ischial tuberosity. The left common hamstring tendon origin is intact. The insertions of the bilateral iliopsoas tendon insertions are intact. Mild fluid signal at the deep aspect of the right gluteus minimus and anterior right gluteus medius tendon insertions, tiny partial-thickness tears. The left gluteus minimus and medius tendon insertions are intact. There is mild fatty infiltration of the mid to anterior right gluteus minimus and medius muscles. Moderate right and mild left anterior adductor muscle edema at the pubic body attachment sites, including of the right pectineus and bilateral adductor longus muscles. Other findings Miscellaneous: Moderate sigmoid and descending colon diverticulosis without inflammatory changes to indicate acute diverticulitis. There is edema within the posterior erector spinae muscles consistent with recent more superior lumbar spine surgery. Mild fluid within the presacral space also is likely due to recent lumbar spine surgery. IMPRESSION: 1. No acute fracture. 2. 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 3. Mild right common hamstring origin tendinosis. 4. Tiny partial-thickness tears of the right gluteus minimus and anterior right gluteus medius tendon insertions. Moderate right and mild left groin  muscle strains at the adductor muscle origins off of the pubic bodies. Electronically Signed   By: Thomas Morgan M.D.   On: 06/29/2022 08:39    Review of Systems  HENT:  Negative for ear discharge, ear pain, hearing loss and tinnitus.   Eyes:  Negative for photophobia and pain.  Respiratory:  Negative for cough and shortness of breath.   Cardiovascular:  Negative for chest pain.  Gastrointestinal:  Negative for abdominal pain, nausea and vomiting.  Genitourinary:  Negative for dysuria, flank pain, frequency and urgency.  Musculoskeletal:  Positive for arthralgias (Right hip). Negative for back pain, myalgias and neck pain.  Neurological:  Negative for dizziness and headaches.  Hematological:  Does not bruise/bleed easily.  Psychiatric/Behavioral:  The patient is not nervous/anxious.    Blood pressure 133/79, pulse 87, temperature 98.2 F (36.8 C), temperature source Oral, resp. rate 16, height '5\' 10"'$  (1.778 m), weight 82.6 kg, SpO2 94 %. Physical Exam Constitutional:      General: He is not in acute distress.    Appearance: He is well-developed. He is not diaphoretic.  HENT:     Head: Normocephalic and atraumatic.  Eyes:     General: No scleral icterus.       Right eye: No discharge.        Left eye: No discharge.     Conjunctiva/sclera: Conjunctivae normal.  Cardiovascular:     Rate and Rhythm: Normal rate and regular rhythm.  Pulmonary:     Effort: Pulmonary effort is normal. No respiratory distress.  Musculoskeletal:  Cervical back: Normal range of motion.     Comments: RLE No traumatic wounds, ecchymosis, or rash  Nontender, mild increase pain with passive ext/int rotation, ext. Mod increase pain with active ext/int roation, ext. Certain movements cause searing pain that will extend into leg down to knee but not well reproducible.  No knee or ankle effusion  Knee stable to varus/ valgus and anterior/posterior stress  Sens DPN, SPN, TN intact  Motor EHL, ext, flex, evers  5/5  DP 2+, PT 2+, No significant edema  Skin:    General: Skin is warm and dry.  Neurological:     Mental Status: He is alert.  Psychiatric:        Mood and Affect: Mood normal.        Behavior: Behavior normal.     Assessment/Plan: Right hip pain -- I have a hard time believing his muscle tears are causing this much pain but even so treatment is just supportive. Could consider using Lidocaine patches over area in question to see if it helps. His large spikes in pain are certainly indicative of muscle spasms. As he's already on scheduled muscle relaxers my only other suggestion there would be to try a different class if that has not been done already. Finally, his presentation is not classic for hip joint arthritis and he hasn't responded to systemic steroids but probably worth having the joint injected by IR to at least take that off the differential. I suspect he will need EMG at some point if this doesn't resolved with conservative measures.    Lisette Abu, PA-C Orthopedic Surgery [phone] 06/29/2022, 1:37 PM

## 2022-06-29 NOTE — Patient Instructions (Signed)
Early Exercise Program Complete 2-3 times a day. Spend 20-30 minutes total each time. Ankle Pumps Lie on your back. Move ankles up and down. Repeat 10 times.  Ankle pumps Heel Slides Lie on your back. Slowly bend and straighten knee. Repeat 10 times.  Heel slides Abdominal Contraction Lie on your back with knees bent and hands resting below ribs. Tighten abdominal muscles to squeeze ribs down toward back. Be sure not to hold your breath. Hold 5 seconds, then relax. Repeat 10 times.  Abdominal contraction Lie on your back with one leg straight and one knee bent. Tighten abdominal muscles to stabilize your low back. Slowly lift leg straight up about 6 to 12 inches and hold for 1 to 5 seconds. Lower leg slowly. Repeat 10 times.  Straight leg raises Intermediate Exercise Program Single Knee to Chest Stretch Lie on your back with both knees bent. Hold thigh behind knee and bring one knee up to chest. Hold 20 seconds, then relax. Repeat 5 times on each side.  Single knee to chest stretch

## 2022-06-30 ENCOUNTER — Inpatient Hospital Stay (HOSPITAL_COMMUNITY): Payer: Medicare PPO

## 2022-06-30 MED ORDER — BUPIVACAINE HCL (PF) 0.25 % IJ SOLN
INTRAMUSCULAR | Status: AC
  Filled 2022-06-30: qty 30

## 2022-06-30 MED ORDER — LIDOCAINE 5 % EX PTCH
1.0000 | MEDICATED_PATCH | CUTANEOUS | Status: DC
  Administered 2022-06-30 – 2022-07-04 (×2): 1 via TRANSDERMAL
  Filled 2022-06-30 (×4): qty 1

## 2022-06-30 MED ORDER — LIDOCAINE HCL (PF) 1 % IJ SOLN
5.0000 mL | Freq: Once | INTRAMUSCULAR | Status: AC
  Administered 2022-06-30: 5 mL
  Filled 2022-06-30: qty 5

## 2022-06-30 MED ORDER — METHYLPREDNISOLONE ACETATE 40 MG/ML IJ SUSP
INTRAMUSCULAR | Status: AC
  Filled 2022-06-30: qty 1

## 2022-06-30 MED ORDER — CYCLOBENZAPRINE HCL 5 MG PO TABS
10.0000 mg | ORAL_TABLET | Freq: Three times a day (TID) | ORAL | Status: DC
  Administered 2022-06-30 – 2022-07-04 (×13): 10 mg via ORAL
  Filled 2022-06-30 (×13): qty 2

## 2022-06-30 MED ORDER — METHYLPREDNISOLONE ACETATE 40 MG/ML INJ SUSP (RADIOLOG
40.0000 mg | Freq: Once | INTRAMUSCULAR | Status: AC
  Administered 2022-06-30: 40 mg via INTRA_ARTICULAR

## 2022-06-30 MED ORDER — IOHEXOL 180 MG/ML  SOLN
10.0000 mL | Freq: Once | INTRAMUSCULAR | Status: AC | PRN
  Administered 2022-06-30: 5 mL via INTRA_ARTICULAR

## 2022-06-30 MED ORDER — BUPIVACAINE HCL (PF) 0.25 % IJ SOLN
5.0000 mL | Freq: Once | INTRAMUSCULAR | Status: AC
  Administered 2022-06-30: 5 mL
  Filled 2022-06-30: qty 10

## 2022-06-30 NOTE — Progress Notes (Signed)
Subjective: Patient reports waking up overnight with mild confusion and was able to sit up at the side of his bed with minimal pain. NAE ON.  Objective: Vital signs in last 24 hours: Temp:  [98 F (36.7 C)-98.6 F (37 C)] 98.2 F (36.8 C) (09/07 0500) Pulse Rate:  [73-98] 89 (09/07 0500) Resp:  [14-18] 17 (09/07 0500) BP: (124-152)/(67-89) 148/80 (09/07 0821) SpO2:  [94 %-96 %] 95 % (09/07 0500)  Intake/Output from previous day: 09/06 0701 - 09/07 0700 In: -  Out: 900 [Urine:900] Intake/Output this shift: Total I/O In: 3 [I.V.:3] Out: -   Physical Exam: Patient is awake, A/O X 4, and conversant. Eyes open spontaneously. They are in NAD and VSS. Speech is fluent and appropriate. MAEW with good strength that is symmetrical. 5/5 BUE, 5/5 LLE, RLE proximally with significant pain with testing, distal 5/5. Sensation to light touch is intact. PERLA, EOMI. CNs grossly intact. Incision is well approximated with no drainage, erythema, or fluctuance.   Lab Results: No results for input(s): "WBC", "HGB", "HCT", "PLT" in the last 72 hours. BMET No results for input(s): "NA", "K", "CL", "CO2", "GLUCOSE", "BUN", "CREATININE", "CALCIUM" in the last 72 hours.  Studies/Results: MR HIP RIGHT WO CONTRAST  Result Date: 06/29/2022 CLINICAL DATA:  Hip trauma. Fracture suspected. Status post L3-4 laminectomy [id number]. Yesterday twisted on bed with onset of severe lower back pain radiating into the right buttock and down right leg. Unable to ambulate. EXAM: MR OF THE RIGHT HIP WITHOUT CONTRAST TECHNIQUE: Multiplanar, multisequence MR imaging was performed. No intravenous contrast was administered. COMPARISON:  CT abdomen pelvis without contrast 06/18/2022 FINDINGS: Bones: No acute fracture or avascular necrosis is seen within the visualized the pelvis or either proximal femur. Articular cartilage and labrum Right hip: Articular cartilage: There is moderate anterior superior and posterosuperior right femoral  head and acetabular cartilage thinning. Moderate to high-grade posterosuperior quadrant of the right acetabulum subchondral degenerative cystic change. Mild right femoral head-neck junction circumferential degenerative osteophytes. 9 mm anterior superior right femoral neck subchondral decreased T1 and predominantly increased T2 signal cystic lesion with thin internal septations and additional low signal superior likely calcification. This may represent degenerative subchondral cyst versus enchondroma. It has benign features. Labrum: There is moderate attenuation and degenerative irregularity of the anterior superior right acetabular labrum. Left hip: Articular cartilage: On large field-of-view images, there is moderate thinning of the left femoroacetabular cartilage. Mild-to-moderate superior left acetabular subchondral degenerative cystic change. Moderate subchondral degenerative cystic changes within the posterosuperior quadrant of the left femoral head-neck junction. Labrum: Mild degenerative irregularity of the superior left acetabular labrum on large field-of-view images. Joint or bursal effusion Joint effusion:  No joint effusion within either hip. Bursae: No trochanteric bursitis. Muscles and tendons Muscles and tendons: The origins of the bilateral sartorius and rectus femoris tendons are intact. Mild right common hamstring origin intermediate T2 signal tendinosis with mild marrow edema within the adjacent right ischial tuberosity. The left common hamstring tendon origin is intact. The insertions of the bilateral iliopsoas tendon insertions are intact. Mild fluid signal at the deep aspect of the right gluteus minimus and anterior right gluteus medius tendon insertions, tiny partial-thickness tears. The left gluteus minimus and medius tendon insertions are intact. There is mild fatty infiltration of the mid to anterior right gluteus minimus and medius muscles. Moderate right and mild left anterior adductor  muscle edema at the pubic body attachment sites, including of the right pectineus and bilateral adductor longus muscles. Other findings Miscellaneous: Moderate  sigmoid and descending colon diverticulosis without inflammatory changes to indicate acute diverticulitis. There is edema within the posterior erector spinae muscles consistent with recent more superior lumbar spine surgery. Mild fluid within the presacral space also is likely due to recent lumbar spine surgery. IMPRESSION: 1. No acute fracture. 2. 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 3. Mild right common hamstring origin tendinosis. 4. Tiny partial-thickness tears of the right gluteus minimus and anterior right gluteus medius tendon insertions. Moderate right and mild left groin muscle strains at the adductor muscle origins off of the pubic bodies. Electronically Signed   By: Yvonne Kendall M.D.   On: 06/29/2022 08:39    Assessment/Plan: 71 y.o. male who is s/p open lumbar 3/4 laminectomy on 06/09/2022 with Dr. Reatha Armour. He was initially recovering well until he had an acute onset of low back pain and RLE radiculopathy. CT lumbar spine reviewed. No acute findings were appreciated. Admitted for pain control due to severity of pain and inability to independently ambulate secondary to the pain. Despite aggressive management which has consisted of PO and IV analgesics, gabapentin, muscle relaxers, and steroids, the patient does not feel that his pain has improved. MRI was performed and revealed no acute process or surgical complications. 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 I have reccommended he try a right-ward directed L5/S1 interlaminar ESI as an outpatient. Ortho consult placed yesterday. Ortho does not feel that his pain is related to muscle tears seen on MRI.  Recommending right hip injection with IR. Overnight, patient reports being able to sit up at on the side of his bed with minimal pain. He feels that he is improving and feels that PT yesterday was exactly what he needed. He does not think he will need any type of rehab and stated he mioght be ready to go home in the next day or two.      -PT/OT -Pain control -Lidocaine patches -D/C Robaxin, begin Flexeril -Right hip injection with IR    LOS: 12 days     Marvis Moeller, DNP, AGNP-C Neurosurgery Nurse Practitioner  [facility] [place]. [address], [address], [place], [place] [address] P: [phone]    F: [phone]  06/30/2022 8:38 AM

## 2022-06-30 NOTE — Procedures (Signed)
PROCEDURE SUMMARY:  Successful fluoroscopic guided right hip steroid injection. No immediate complications.  Pt tolerated well.   EBL = <1 ml  Please see full dictation in imaging section of Epic for procedure details. \    Narda Rutherford, AGNP-BC 06/30/2022, 9:58 AM

## 2022-06-30 NOTE — Progress Notes (Signed)
Physical Therapy Treatment Patient Details Name: Thomas Morgan MRN: [id number] DOB: January 06, 1951 Today's Date: 06/30/2022   History of Present Illness Pt is a 71 y/o male admitted secondary to increased back and RLE pain. Pt with recent laminectomy on 8/17. R hip steroid injection 06/30/22. PMH includes basal cell carcinoma.    PT Comments    Pt received in supine, agreeable to therapy session after premedication with encouragement. Pt needing increased time and cues to sequencing simple mobility tasks and self-limiting due to pain and anticipation of pain. Pt with poor pain tolerance but able to progress to transfer training with +2 minA and max cues, pt maintaining RLE TDWB due to severe pain with WB on RLE (mostly around hamstring muscle region). Pt unable to initiate transfer to chair or gait due to severe pain and unable to take any steps with +2 assist and RW. Pt continues to benefit from PT services to progress toward functional mobility goals, disposition below remains appropriate.   Recommendations for follow up therapy are one component of a multi-disciplinary discharge planning process, led by the attending physician.  Recommendations may be updated based on patient status, additional functional criteria and insurance authorization.  Follow Up Recommendations  Skilled nursing-short term rehab (<3 hours/day) Can patient physically be transported by private vehicle: No   Assistance Recommended at Discharge Frequent or constant Supervision/Assistance  Patient can return home with the following A lot of help with walking and/or transfers;A lot of help with bathing/dressing/bathroom   Equipment Recommendations  Other (comment) (TBD)    Recommendations for Other Services       Precautions / Restrictions Precautions Precautions: Back;Fall Precaution Booklet Issued: Yes (comment) Precaution Comments: reviewed back precautions and log roll technique in bed Restrictions Weight Bearing  Restrictions: No     Mobility  Bed Mobility Overal bed mobility: Needs Assistance Bed Mobility: Rolling, Sidelying to Sit, Sit to Supine Rolling: Min guard Sidelying to sit: Min assist, +2 for safety/equipment   Sit to supine: +2 for physical assistance, Mod assist   General bed mobility comments: verbal cues for log roll technique and use of rail, min assist to raise trunk, increased time, pivoted to supine with assist for LEs and to guide trunk via "helicopter" technique for reduced pain.    Transfers Overall transfer level: Needs assistance Equipment used: Rolling walker (2 wheels) Transfers: Sit to/from Stand Sit to Stand: +2 physical assistance, Min assist    General transfer comment: assist to maintain R LE extended, to rise, steady and control descent. Pt reports too much pain with weight bearing standing at bedside so maintains RLE TTWB for comfort. Unable to sidestep/hop toward chair due to pain.    Ambulation/Gait               General Gait Details: pt unable     Balance Overall balance assessment: Needs assistance   Sitting balance-Leahy Scale: Fair     Standing balance support: Bilateral upper extremity supported Standing balance-Leahy Scale: Poor Standing balance comment: Reliant on RW support due to pain                            Cognition Arousal/Alertness: Awake/alert Behavior During Therapy: Anxious, Flat affect Overall Cognitive Status: Within Functional Limits for tasks assessed                                 General  Comments: pt with difficulty following commands and sequencing with increased pain, possibly some cognitive component to pain as pt body mechanics do not seem to always accurately line up with symptoms and pt calling out in anticipation of pain and screaming when not moving at times. Per RN, overnight pt sat up edge of bed unassisted and reported minimal pain but was more confused at that time.         Exercises General Exercises - Lower Extremity Ankle Circles/Pumps: AROM, Both, 10 reps, Supine    General Comments General comments (skin integrity, edema, etc.): VSS per chart review; loudly calling out in pain, RN aware.      Pertinent Vitals/Pain Pain Assessment Pain Assessment: Faces Faces Pain Scale: Hurts whole lot Pain Location: back of R thigh (hamstring?) from gluteal attachment area to lateral R knee Pain Descriptors / Indicators: Radiating, Shooting, Burning, Cramping, Moaning, Grimacing, Guarding Pain Intervention(s): Limited activity within patient's tolerance, Monitored during session, Premedicated before session, Repositioned, RN gave pain meds during session, Other (comment) (RN applied lidocaine patch while pt standing at EOB)           PT Goals (current goals can now be found in the care plan section) Acute Rehab PT Goals Patient Stated Goal: to decrease pain PT Goal Formulation: With patient Time For Goal Achievement: 07/04/22 Progress towards PT goals: Progressing toward goals    Frequency    Min 3X/week      PT Plan Current plan remains appropriate    Co-evaluation PT/OT/SLP Co-Evaluation/Treatment: Yes Reason for Co-Treatment: For patient/therapist safety;To address functional/ADL transfers PT goals addressed during session: Mobility/safety with mobility;Balance;Proper use of DME OT goals addressed during session: Strengthening/ROM      AM-PAC PT "6 Clicks" Mobility   Outcome Measure  Help needed turning from your back to your side while in a flat bed without using bedrails?: A Little Help needed moving from lying on your back to sitting on the side of a flat bed without using bedrails?: A Lot Help needed moving to and from a bed to a chair (including a wheelchair)?: Total Help needed standing up from a chair using your arms (e.g., wheelchair or bedside chair)?: A Lot Help needed to walk in hospital room?: Total Help needed climbing 3-5 steps  with a railing? : Total 6 Click Score: 10    End of Session Equipment Utilized During Treatment: Gait belt (pulled higher up under armpits to avoid sore area in lower back) Activity Tolerance: Patient limited by pain (pt anticipation/anxiety regarding pain limiting effort) Patient left: in bed;with call bell/phone within reach;with bed alarm set;with family/visitor present Nurse Communication: Mobility status PT Visit Diagnosis: Difficulty in walking, not elsewhere classified (R26.2);Pain Pain - Right/Left: Right Pain - part of body: Leg     Time: 1500-1530 PT Time Calculation (min) (ACUTE ONLY): 30 min  Charges:  $Therapeutic Activity: 8-22 mins                     Miriam Kestler P., PTA Acute Rehabilitation Services Secure Chat Preferred 9a-5:30pm Office: [place] 06/30/2022, 5:54 PM

## 2022-06-30 NOTE — Progress Notes (Signed)
Occupational Therapy Treatment Patient Details Name: Thomas Morgan MRN: [id number] DOB: 1951/07/10 Today's Date: 06/30/2022   History of present illness Pt is a 71 y/o male admitted secondary to increased back and RLE pain. Pt with recent laminectomy on 8/17. R hip steroid injection 06/30/22. PMH includes basal cell carcinoma.   OT comments  Pt able to progress to standing with RW with PT maintaining R LE extension, avoiding weight on R LE and OT providing lift and steadying assist. Pt limited by severe R posterior thigh pain. Unable to side step. Reinforced log roll technique to get to EOB, but pivoted with assist back to supine. Updated discharge recommendation to SNF as pt is requiring +2 assist for OOB. Progressing slowly.    Recommendations for follow up therapy are one component of a multi-disciplinary discharge planning process, led by the attending physician.  Recommendations may be updated based on patient status, additional functional criteria and insurance authorization.    Follow Up Recommendations  Skilled nursing-short term rehab (<3 hours/day)    Assistance Recommended at Discharge Frequent or constant Supervision/Assistance  Patient can return home with the following  Two people to help with walking and/or transfers;Two people to help with bathing/dressing/bathroom;Assistance with cooking/housework;Help with stairs or ramp for entrance;Assist for transportation   Equipment Recommendations  BSC/3in1    Recommendations for Other Services      Precautions / Restrictions Precautions Precautions: Back;Fall Restrictions Weight Bearing Restrictions: No       Mobility Bed Mobility Overal bed mobility: Needs Assistance Bed Mobility: Rolling, Sidelying to Sit, Sit to Supine Rolling: Min guard Sidelying to sit: Min assist   Sit to supine: +2 for physical assistance, Mod assist   General bed mobility comments: verbal cues for log roll technique and use of rail, min assist to  raise trunk, increased time, pivoted to supine with assist for LEs and to guide trunk    Transfers Overall transfer level: Needs assistance Equipment used: Rolling walker (2 wheels) Transfers: Sit to/from Stand Sit to Stand: +2 physical assistance, Min assist           General transfer comment: assist to maintain R LE extended, to rise, steady and control descent     Balance Overall balance assessment: Needs assistance   Sitting balance-Leahy Scale: Fair     Standing balance support: Bilateral upper extremity supported Standing balance-Leahy Scale: Poor                             ADL either performed or assessed with clinical judgement   ADL Overall ADL's : Needs assistance/impaired                 Upper Body Dressing : Moderate assistance;Bed level Upper Body Dressing Details (indicate cue type and reason): to don and doff gown                        Extremity/Trunk Assessment              Vision       Perception     Praxis      Cognition Arousal/Alertness: Awake/alert Behavior During Therapy: Anxious, Flat affect Overall Cognitive Status: Within Functional Limits for tasks assessed                                 General Comments: pt with difficulty following  commands and sequencing with increased pain        Exercises      Shoulder Instructions       General Comments      Pertinent Vitals/ Pain       Pain Assessment Pain Assessment: Faces Faces Pain Scale: Hurts whole lot Pain Location: back of R thigh Pain Descriptors / Indicators: Radiating, Shooting, Burning Pain Intervention(s): Monitored during session, Repositioned, Other (comment) (RN placed lidocaine patch)  Home Living                                          Prior Functioning/Environment              Frequency  Min 2X/week        Progress Toward Goals  OT Goals(current goals can now be found in the  care plan section)  Progress towards OT goals: Progressing toward goals  Acute Rehab OT Goals OT Goal Formulation: With patient/family Time For Goal Achievement: 07/04/22 Potential to Achieve Goals: Good  Plan Frequency remains appropriate;Discharge plan needs to be updated    Co-evaluation    PT/OT/SLP Co-Evaluation/Treatment: Yes Reason for Co-Treatment: For patient/therapist safety   OT goals addressed during session: Strengthening/ROM      AM-PAC OT "6 Clicks" Daily Activity     Outcome Measure   Help from another person eating meals?: A Little Help from another person taking care of personal grooming?: A Little Help from another person toileting, which includes using toliet, bedpan, or urinal?: Total Help from another person bathing (including washing, rinsing, drying)?: A Lot Help from another person to put on and taking off regular upper body clothing?: A Lot Help from another person to put on and taking off regular lower body clothing?: Total 6 Click Score: 12    End of Session Equipment Utilized During Treatment: Rolling walker (2 wheels);Gait belt  OT Visit Diagnosis: Muscle weakness (generalized) (M62.81);Pain;Other symptoms and signs involving cognitive function;Other symptoms and signs involving the nervous system (R29.898) Pain - Right/Left: Right Pain - part of body: Leg   Activity Tolerance Patient limited by pain   Patient Left in bed;with call bell/phone within reach;with bed alarm set;with family/visitor present   Nurse Communication Mobility status;Other (comment) (RN applied lidocaine patch to R LE)        Time: 4920-1007 OT Time Calculation (min): 27 min  Charges: OT General Charges $OT Visit: 1 Visit OT Treatments $Therapeutic Activity: 8-22 mins  Cleta Alberts, OTR/L Acute Rehabilitation Services Office: [phone]   Malka So 06/30/2022, 3:47 PM

## 2022-07-01 LAB — CREATININE, SERUM
Creatinine, Ser: 0.58 mg/dL — ABNORMAL LOW (ref 0.61–1.24)
GFR, Estimated: >60 mL/min (ref >60)

## 2022-07-01 NOTE — TOC Progression Note (Addendum)
Transition of Care [facility]) - Progression Note    Patient Details  Name: Thomas Morgan MRN: [id number] Date of Birth: Jun 16, 1951  Transition of Care [facility]) CM/SW [place], [place] Work Phone Number: 07/01/2022, 3:24 PM  Clinical Narrative:    MSW intern spoke with patient, patient's wife and son at bedside regarding the discharge plan. Patient and patient's wife advised they have chosen [facility] [employer] MSW intern advised she would be back in touch once a discharge date was determined.    Expected Discharge Plan: [place] Barriers to Discharge: Continued Medical Work up  Expected Discharge Plan and Services Expected Discharge Plan: [place] arrangements for the past 2 months: [place]                                       Social Determinants of Health (SDOH) Interventions    Readmission Risk Interventions     No data to display

## 2022-07-01 NOTE — Progress Notes (Signed)
Occupational Therapy Treatment Patient Details Name: Thomas Morgan MRN: [id number] DOB: 15-Feb-1951 Today's Date: 07/01/2022   History of present illness Pt is a 71 y/o male admitted secondary to increased back and RLE pain. Pt with recent laminectomy on 8/17. R hip steroid injection 06/30/22. PMH includes basal cell carcinoma.   OT comments  Pt able to perform rolling and sidelying to sit with verbal cues only and no report of pain. Stood x 1 with RW and + 2 mod assist from elevated bed with severe pain in R LE an inability to weight bear. Pt returned to sitting and supine. Pt frustrated by slow progress. Pt did not recall standing with therapies yesterday. He needs maximum verbal cues and increased time for all mobility.    Recommendations for follow up therapy are one component of a multi-disciplinary discharge planning process, led by the attending physician.  Recommendations may be updated based on patient status, additional functional criteria and insurance authorization.    Follow Up Recommendations  Skilled nursing-short term rehab (<3 hours/day)    Assistance Recommended at Discharge Frequent or constant Supervision/Assistance  Patient can return home with the following  Two people to help with walking and/or transfers;Two people to help with bathing/dressing/bathroom;Assistance with cooking/housework;Help with stairs or ramp for entrance;Assist for transportation   Equipment Recommendations  Other (comment) (defer to next venue)    Recommendations for Other Services      Precautions / Restrictions Precautions Precautions: Back;Fall Restrictions Weight Bearing Restrictions: No       Mobility Bed Mobility Overal bed mobility: Needs Assistance Bed Mobility: Rolling, Sidelying to Sit, Sit to Sidelying Rolling: Min guard Sidelying to sit: Min guard     Sit to sidelying: Min assist General bed mobility comments: cues for log roll technique, +use of rail, no physical assist to  achieve sitting EOB, min assist to support R LE back to bed    Transfers Overall transfer level: Needs assistance Equipment used: Rolling walker (2 wheels) Transfers: Sit to/from Stand Sit to Stand: +2 physical assistance, Mod assist           General transfer comment: cues to rise and steady, cues for hand placement, pt with increased R LE upon standing, unable to tolerate weight through R LE to attempt steps or transfer     Balance Overall balance assessment: Needs assistance   Sitting balance-Leahy Scale: Fair     Standing balance support: Bilateral upper extremity supported Standing balance-Leahy Scale: Poor Standing balance comment: reliant on B UE support of RW and +2 assist                           ADL either performed or assessed with clinical judgement   ADL Overall ADL's : Needs assistance/impaired     Grooming: Bed level;Set up           Upper Body Dressing : Moderate assistance;Bed level Upper Body Dressing Details (indicate cue type and reason): to don and doff gown                        Extremity/Trunk Assessment              Vision       Perception     Praxis      Cognition Arousal/Alertness: Awake/alert Behavior During Therapy: Flat affect, Anxious Overall Cognitive Status: Impaired/Different from baseline Area of Impairment: Problem solving, Safety/judgement, Memory  Memory: Decreased short-term memory, Decreased recall of precautions   Safety/Judgement: Decreased awareness of safety   Problem Solving: Slow processing, Decreased initiation, Difficulty sequencing, Requires verbal cues General Comments: pt did not recall standing in therapy yesterday        Exercises      Shoulder Instructions       General Comments      Pertinent Vitals/ Pain       Pain Assessment Pain Assessment: Faces Faces Pain Scale: Hurts whole lot Pain Location: R LE Pain Descriptors /  Indicators: Radiating, Shooting, Burning, Cramping, Moaning, Grimacing, Guarding Pain Intervention(s): Premedicated before session, Monitored during session, Repositioned  Home Living                                          Prior Functioning/Environment              Frequency  Min 2X/week        Progress Toward Goals  OT Goals(current goals can now be found in the care plan section)  Progress towards OT goals: Progressing toward goals  Acute Rehab OT Goals OT Goal Formulation: With patient/family Time For Goal Achievement: 07/04/22 Potential to Achieve Goals: Good  Plan Frequency remains appropriate;Discharge plan needs to be updated    Co-evaluation    PT/OT/SLP Co-Evaluation/Treatment: Yes Reason for Co-Treatment: For patient/therapist safety   OT goals addressed during session: Strengthening/ROM      AM-PAC OT "6 Clicks" Daily Activity     Outcome Measure   Help from another person eating meals?: None Help from another person taking care of personal grooming?: A Little Help from another person toileting, which includes using toliet, bedpan, or urinal?: Total Help from another person bathing (including washing, rinsing, drying)?: A Lot Help from another person to put on and taking off regular upper body clothing?: A Lot Help from another person to put on and taking off regular lower body clothing?: Total 6 Click Score: 13    End of Session Equipment Utilized During Treatment: Gait belt;Rolling walker (2 wheels)  OT Visit Diagnosis: Muscle weakness (generalized) (M62.81);Pain;Other symptoms and signs involving cognitive function;Other symptoms and signs involving the nervous system (R29.898) Pain - Right/Left: Right Pain - part of body: Leg   Activity Tolerance Patient limited by pain   Patient Left in bed;with call bell/phone within reach;with bed alarm set;with family/visitor present   Nurse Communication Other (comment) (coordinated  pain meds with session)        Time: 8891-6945 OT Time Calculation (min): 32 min  Charges: OT General Charges $OT Visit: 1 Visit OT Treatments $Therapeutic Activity: 8-22 mins  Thomas Morgan, OTR/L Acute Rehabilitation Services Office: [phone]   Malka So 07/01/2022, 12:19 PM

## 2022-07-01 NOTE — Progress Notes (Signed)
Physical Therapy Treatment Patient Details Name: Thomas Morgan MRN: [id number] DOB: Mar 16, 1951 Today's Date: 07/01/2022   History of Present Illness Pt is a 71 y/o male admitted secondary to increased back and RLE pain. Pt with recent laminectomy on 8/17. R hip steroid injection 06/30/22. PMH includes basal cell carcinoma.    PT Comments    Pt complaining of continued severe, electrical-type pain down posterior thigh all the way to foot during mobility, appears to come in waves and is not constant. Pt overall requiring mod +2 for standing at EOB, unable to WB on RLE and tolerated standing EOB x1 minute only before needing to return to sit and ultimately to supine. Pt is frustrated and irritable about lack of progress, although this PT feels pt is progressing slowly as did not require physical assist for log roll to EOB today. Will continue to follow.    Recommendations for follow up therapy are one component of a multi-disciplinary discharge planning process, led by the attending physician.  Recommendations may be updated based on patient status, additional functional criteria and insurance authorization.  Follow Up Recommendations  Skilled nursing-short term rehab (<3 hours/day) Can patient physically be transported by private vehicle: No   Assistance Recommended at Discharge Frequent or constant Supervision/Assistance  Patient can return home with the following A lot of help with walking and/or transfers;A lot of help with bathing/dressing/bathroom   Equipment Recommendations  Other (comment) (tbd)    Recommendations for Other Services       Precautions / Restrictions Precautions Precautions: Back;Fall Restrictions Weight Bearing Restrictions: No     Mobility  Bed Mobility Overal bed mobility: Needs Assistance Bed Mobility: Rolling, Sidelying to Sit, Sit to Sidelying Rolling: Min guard Sidelying to sit: Min guard     Sit to sidelying: Min assist General bed mobility comments:  cues for log roll technique, +use of rail, no physical assist to achieve sitting EOB, min assist to support R LE back to bed    Transfers Overall transfer level: Needs assistance Equipment used: Rolling walker (2 wheels) Transfers: Sit to/from Stand Sit to Stand: +2 physical assistance, Mod assist           General transfer comment: cues to rise and steady, cues for hand placement, pt with increased R LE upon standing, unable to tolerate weight through R LE to attempt steps or transfer    Ambulation/Gait               General Gait Details: pt unable   Stairs             Wheelchair Mobility    Modified Rankin (Stroke Patients Only)       Balance Overall balance assessment: Needs assistance   Sitting balance-Leahy Scale: Fair     Standing balance support: Bilateral upper extremity supported Standing balance-Leahy Scale: Poor Standing balance comment: reliant on B UE support of RW and +2 assist                            Cognition Arousal/Alertness: Awake/alert Behavior During Therapy: Flat affect, Anxious Overall Cognitive Status: Impaired/Different from baseline Area of Impairment: Problem solving, Safety/judgement, Memory                     Memory: Decreased short-term memory, Decreased recall of precautions   Safety/Judgement: Decreased awareness of safety   Problem Solving: Slow processing, Decreased initiation, Difficulty sequencing, Requires verbal cues General Comments: pt  did not recall standing in therapy yesterday, irritable secondary to pain        Exercises      General Comments        Pertinent Vitals/Pain Pain Assessment Pain Assessment: Faces Faces Pain Scale: Hurts whole lot Pain Location: R LE Pain Descriptors / Indicators: Radiating, Shooting, Burning, Cramping, Moaning, Grimacing, Guarding Pain Intervention(s): Limited activity within patient's tolerance, Monitored during session, Repositioned     Home Living                          Prior Function            PT Goals (current goals can now be found in the care plan section) Acute Rehab PT Goals Patient Stated Goal: to decrease pain PT Goal Formulation: With patient Time For Goal Achievement: 07/04/22 Potential to Achieve Goals: Good Progress towards PT goals: Progressing toward goals    Frequency    Min 3X/week      PT Plan Current plan remains appropriate    Co-evaluation PT/OT/SLP Co-Evaluation/Treatment: Yes Reason for Co-Treatment: For patient/therapist safety;To address functional/ADL transfers PT goals addressed during session: Mobility/safety with mobility;Balance OT goals addressed during session: Strengthening/ROM      AM-PAC PT "6 Clicks" Mobility   Outcome Measure  Help needed turning from your back to your side while in a flat bed without using bedrails?: A Little Help needed moving from lying on your back to sitting on the side of a flat bed without using bedrails?: A Lot Help needed moving to and from a bed to a chair (including a wheelchair)?: A Lot Help needed standing up from a chair using your arms (e.g., wheelchair or bedside chair)?: A Lot Help needed to walk in hospital room?: Total Help needed climbing 3-5 steps with a railing? : Total 6 Click Score: 11    End of Session Equipment Utilized During Treatment: Gait belt Activity Tolerance: Patient limited by pain Patient left: in bed;with call bell/phone within reach;with bed alarm set;with family/visitor present Nurse Communication: Mobility status PT Visit Diagnosis: Difficulty in walking, not elsewhere classified (R26.2);Pain Pain - Right/Left: Right Pain - part of body: Leg     Time: 3614-4315 PT Time Calculation (min) (ACUTE ONLY): 32 min  Charges:  $Therapeutic Activity: 8-22 mins                     Stacie Glaze, PT DPT Acute Rehabilitation Services Pager [phone]  Office [phone]    [place] E  Ruffin Pyo 07/01/2022, 1:15 PM

## 2022-07-01 NOTE — Progress Notes (Signed)
Subjective: Patient reports right hip injection helped mildly. Pain in RLE continues to be intermittently severe. NAE ON.  Objective: Vital signs in last 24 hours: Temp:  [98 F (36.7 C)-98.3 F (36.8 C)] 98 F (36.7 C) (09/08 0300) Pulse Rate:  [70-81] 70 (09/08 0300) Resp:  [16-18] 16 (09/08 0300) BP: (126-156)/(75-82) 134/75 (09/08 0300) SpO2:  [93 %-97 %] 96 % (09/08 0300)  Intake/Output from previous day: 09/07 0701 - 09/08 0700 In: 3 [I.V.:3] Out: 350 [Urine:350] Intake/Output this shift: No intake/output data recorded.  Physical Exam: Patient is awake, A/O X 4, and conversant. Eyes open spontaneously. They are in NAD and VSS. Speech is fluent and appropriate. MAEW with good strength that is symmetrical. 5/5 BUE, 5/5 LLE, RLE proximally with significant pain with testing, distal 5/5. Sensation to light touch is intact. PERLA, EOMI. CNs grossly intact. Incision is well approximated with no drainage, erythema, or fluctuance.  Lab Results: No results for input(s): "WBC", "HGB", "HCT", "PLT" in the last 72 hours. BMET Recent Labs    07/01/22 0638  CREATININE 0.58*    Studies/Results: DG FLUORO GUIDED NEEDLE PLC ASPIRATION/INJECTION LOC  Result Date: 06/30/2022 CLINICAL DATA:  Patient with complaint of unable to stand or bear weight due to severe right hip/leg pain x 10 days. Pt referred to IR for right hip steroid injection EXAM: RIGHT HIP INJECTION UNDER FLUOROSCOPY COMPARISON:  None Available. FLUOROSCOPY: Radiation Exposure Index (as provided by the fluoroscopic device): 4.2 mGy Kerma PROCEDURE: Overlying skin prepped with Betadine, draped in the usual sterile fashion, and infiltrated locally with buffered Lidocaine. 20 gauge spinal needle advanced the right superior femoral head/neck junction. 1 ml of Lidocaine injected easily. Diagnostic injection of iodinated contrast demonstrates intra-articular spread without intravascular component. '40mg'$  Depo-Medrol and 5 ml Sensorcaine  0.25% were then administered. No immediate complication. IMPRESSION: Technically successful right hip injection under fluoroscopy. Read by: Narda Rutherford, AGNP-BC.  Supervised by Abigail Miyamoto, M.D. Electronically Signed   By: Abigail Miyamoto M.D.   On: 06/30/2022 11:00    Assessment/Plan: 71 y.o. male who is s/p open lumbar 3/4 laminectomy on 06/09/2022 with Dr. Reatha Armour. He was initially recovering well until he had an acute onset of low back pain and RLE radiculopathy. CT lumbar spine reviewed. No acute findings were appreciated. Admitted for pain control due to severity of pain and inability to independently ambulate secondary to the pain. Despite aggressive management which has consisted of PO and IV analgesics, gabapentin, muscle relaxers, and steroids, the patient does not feel that his pain has improved. MRI was performed and revealed no acute process or surgical complications. 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 I have reccommended he try a right-ward directed L5/S1 interlaminar ESI as an outpatient. Ortho consult placed. Ortho does not feel that his pain is related to muscle tears seen on MRI. Right hip injection performed by IR per ortho recommendations, only helped mildly. His pain continues to be severe and is requiring 2+ assist. PT/OT continue to recommend SNF. Patient and wife are now amenable to SNF. Per patient, they are looking at a SNF facility that they would prefer him go to.      LOS: 13 days     Marvis Moeller 07/01/2022, 8:05 AM

## 2022-07-02 LAB — GLUCOSE, CAPILLARY: Glucose-Capillary: 156 mg/dL — ABNORMAL HIGH (ref 70–99)

## 2022-07-02 MED ORDER — OXYCODONE HCL 5 MG PO TABS
10.0000 mg | ORAL_TABLET | Freq: Four times a day (QID) | ORAL | Status: DC | PRN
  Administered 2022-07-02: 10 mg via ORAL
  Filled 2022-07-02: qty 2

## 2022-07-02 NOTE — Progress Notes (Signed)
   Providing Compassionate, Quality Care - Together  NEUROSURGERY PROGRESS NOTE   S: No issues overnight. Pain more controlled  O: EXAM:  BP 132/84 (BP Location: Right Arm)   Pulse 100   Temp 98.2 F (36.8 C) (Oral)   Resp 20   Ht '5\' 10"'$  (1.778 m)   Wt 82.6 kg   SpO2 94%   BMI 26.11 kg/m   Awake, alert, oriented x3 PERRL Speech fluent, appropriate  CNs grossly intact  5/5 BUE LLE 5/5 4 to 4+/5 RLE  Tolerates hip rotation this am  ASSESSMENT:  71 y.o. male with   R hip and RLE pain   PLAN: -pain more controlled, tolerated more movement this am. Able to scoot in bed himself -stood at bedside with my assistance with limited pain, could take steps left and right with walker -cont pt, maybe will progress to [facility] -unsure of etiology, ortho recs do not believe the muscular tears are attributable, could be a flare of L spine pathology but he is making significant improvements an therefore I do not believe he warrants surgical intervention -R hip inj helped as well    Thank you for allowing me to participate in this patient's care.  Please do not hesitate to call with questions or concerns.   Elwin Sleight, [facility] Cell: [phone]

## 2022-07-03 NOTE — Progress Notes (Signed)
  NEUROSURGERY PROGRESS NOTE   No issues overnight. Pt cont to report significant improvements in right hip/leg pain over last 2 days. Able to ambulate to BR with rolling walker.   EXAM:  BP 133/81 (BP Location: Left Arm)   Pulse (!) 101   Temp 98 F (36.7 C) (Temporal)   Resp 15   Ht '5\' 10"'$  (1.778 m)   Wt 82.6 kg   SpO2 96%   BMI 26.11 kg/m   Awake, alert, oriented  Speech fluent, appropriate  CN grossly intact  5/5 BUE/LLE Diffusely 4+/5 RLE   IMPRESSION:  71 y.o. male s/p L3-4 decompression with recurrent severe right leg pain of unclear etiology. Appears to be significantly improving over last 2 days. With continued improvement, suspect he could go home with HHPT/OT in the next day or two.  PLAN: - Cont efforts at mobilization with PT/OT and nursing staff - Cont current pain regimen   Consuella Lose, MD [facility]

## 2022-07-03 NOTE — Progress Notes (Signed)
  Pt ambulated multiple times during the way to the bathroom, with stby assist and the 2 wheel walker. Very steady on his feet. No c/o pain with ambulation

## 2022-07-03 NOTE — Plan of Care (Signed)

## 2022-07-04 MED ORDER — OXYCODONE HCL 10 MG PO TABS: 10.0000 mg | ORAL_TABLET | Freq: Four times a day (QID) | ORAL | 0 refills | Status: DC | PRN

## 2022-07-04 MED ORDER — GABAPENTIN 300 MG PO CAPS: 600.0000 mg | ORAL_CAPSULE | Freq: Three times a day (TID) | ORAL | 3 refills | Status: DC

## 2022-07-04 MED ORDER — OXYCODONE HCL ER 10 MG PO T12A: 10.0000 mg | EXTENDED_RELEASE_TABLET | Freq: Two times a day (BID) | ORAL | 0 refills | Status: DC

## 2022-07-04 MED ORDER — CYCLOBENZAPRINE HCL 10 MG PO TABS: 10.0000 mg | ORAL_TABLET | Freq: Three times a day (TID) | ORAL | 0 refills | Status: DC | PRN

## 2022-07-04 NOTE — Progress Notes (Signed)
Subjective: Patient reports significant improvement in his RLE pain and has been able to ambulate ands sit up in his bedside chair with little pain and difficulty. He would like to be discharged home today or tomorrow with home health therapy.   Objective: Vital signs in last 24 hours: Temp:  [98 F (36.7 C)-98.4 F (36.9 C)] 98.4 F (36.9 C) (09/11 0312) Pulse Rate:  [68-101] 68 (09/11 0312) Resp:  [15-17] 17 (09/11 0312) BP: (133-158)/(80-85) 158/85 (09/11 0312) SpO2:  [94 %-96 %] 94 % (09/11 0312)  Intake/Output from previous day: 09/10 0701 - 09/11 0700 In: 2077.2 [P.O.:240; I.V.:1837.2] Out: 2575 [Urine:2575] Intake/Output this shift: No intake/output data recorded.  Physical Exam: Patient is awake, A/O X 4, and conversant. Sitting comfortably in his bedside chair. Eyes open spontaneously. They are in NAD and VSS. Speech is fluent and appropriate. MAEW with good strength that is symmetrical. 5/5 BUE, 5/5 LLE, RLE 4+/5. Sensation to light touch is intact. PERLA, EOMI. CNs grossly intact. Incision is well approximated with no drainage, erythema, or fluctuance.  Lab Results: No results for input(s): "WBC", "HGB", "HCT", "PLT" in the last 72 hours. BMET No results for input(s): "NA", "K", "CL", "CO2", "GLUCOSE", "BUN", "CREATININE", "CALCIUM" in the last 72 hours.  Studies/Results: No results found.  Assessment/Plan: 71 y.o. male with severe and intractable right hip pain and RLE pain of unknown etiology. No neurosurgical cause was identified. Ortho consult placed. Ortho does not feel the muscular tears on MRI right hip are attributable to his symptoms. He was given a right hip injection by IR. His pain is beginning to improve. He has been able to ambulate with less assistance and has had a significant reduction of his pain. Will plan on him working with therapy today in hopes that he will be able to progress to needing home health therapy only and possibly be ready to discharge  today or tomorrow.   -Continue PT/OT -Continue pain control -Continue to encourage mobility and ambulation      LOS: 16 days     Thomas Morgan 07/04/2022, 8:08 AM

## 2022-07-04 NOTE — Progress Notes (Signed)
Discharge instructions (including medications) discussed with and copy provided to patient/caregiver 

## 2022-07-04 NOTE — TOC Transition Note (Signed)
Transition of Care [facility]) - CM/SW Discharge Note   Patient Details  Name: Thomas Morgan MRN: [id number] Date of Birth: January 23, 1951  Transition of Care [facility]) CM/SW Contact:  Cyndi Bender, RN Phone Number: 07/04/2022, 4:12 PM   Clinical Narrative:    Patient is stable for discharge. Home health and DME ordered. Spoke to patient and wife at bedside. Offered choice for home health. Patient defers to [facility] to find highly rated agency. Cory with [facility] accepted referral. Patient agreeable to use in house provider [employer] for DME. Spoke to [place] with [facility]  And ordered walker and BSC. Insurance declined BSC, patient is aware. No other TOC needs at this time.  Final next level of care: [place] Barriers to Discharge: Barriers Resolved   Patient Goals and CMS Choice Patient states their goals for this hospitalization and ongoing recovery are:: return home CMS Medicare.gov Compare Post Acute Care list provided to:: Patient Choice offered to / list presented to : Patient  Discharge Placement             home          Discharge Plan and Services   Discharge Planning Services: CM Consult Post Acute Care Choice: Home Health, Durable Medical Equipment          DME Arranged: Walker rolling, Bedside commode DME Agency: [employer] Date DME Agency Contacted: 07/04/22 Time DME Agency Contacted: [phone] Representative spoke with at DME Agency: Annie Sable HH Arranged: OT, PT [place] Agency: [place] Date [place] [facility] [place]: 07/04/22 Time [place]: [id number] Representative spoke with at [place]: [place] ([place]) Interventions     Readmission Risk Interventions    07/04/2022   11:44 AM  Readmission Risk Prevention Plan  Post Dischage Appt Complete  Medication Screening Complete  Transportation Screening Complete

## 2022-07-04 NOTE — Discharge Summary (Signed)
Physician Discharge Summary  Patient ID: Thomas Morgan MRN: [id number] DOB/AGE: [birth year] 72 y.o.  Admit date: 06/18/2022 Discharge date: 07/04/2022  Admission Diagnoses: Lumbar radiculopathy, right hip pain  Discharge Diagnoses:  Lumbar radiculopathy, right hip pain Principal Problem:   Lumbar radiculopathy   Discharged Condition: good  Hospital Course: The patient was admitted on 06/18/2022 due to complaints of severe and intractable right hip pain and RLE pain of unknown etiology. No neurosurgical cause was identified. Ortho consult placed. Ortho does not feel the muscular tears on MRI right hip are attributable to his symptoms. He was given a right hip injection by IR. His pain is beginning to improve. He has been able to ambulate with less assistance and has had a significant reduction of his pain. PT recommending HHPT. The patient remained afebrile with stable vital signs, and tolerated a regular diet. The patient continued to increase activities, and pain was well controlled with oral pain medications.  Consults: orthopedic surgery  Significant Diagnostic Studies: radiology: MRI: lumbar, right hip and CT scan: renal stone study, lumbar   Treatments: steroids: Decadron, right IR hip injection  Discharge Exam: Blood pressure 134/86, pulse 96, temperature 97.8 F (36.6 C), temperature source Oral, resp. rate 18, height '5\' 10"'$  (1.778 m), weight 82.6 kg, SpO2 94 %. Physical Exam: Patient is awake, A/O X 4, and conversant. Sitting comfortably in his bedside chair. Eyes open spontaneously. They are in NAD and VSS. Speech is fluent and appropriate. MAEW with good strength that is symmetrical. 5/5 BUE, 5/5 LLE, RLE 4+/5. Sensation to light touch is intact. PERLA, EOMI. CNs grossly intact. Incision is well approximated with no drainage, erythema, or fluctuance.  Disposition: Discharge disposition: 01-Home or Self Care       Discharge Instructions     Ambulatory referral to [facility]   Complete by: As directed    Please evaluate [place] [facility] for admission to [facility].  Disciplines requested: Physical Therapy  Services to provide: Evaluate  Physician to follow patient's care (the person listed here will be responsible for signing ongoing orders): Referring Provider  Requested Start of Care Date: Within 2-3 days  I certify that this patient is under my care and that I, or a Nurse Practitioner or Physician's Assistant working with me, had a face-to-face encounter that meets the physician face-to-face requirements with patient on 07/04/2022. The encounter with the patient was in whole, or in part for the following medical condition(s) which is the primary reason for home health care (List medical condition). Lumbar radiculopathy  Special Instructions:   Does the patient have Medicare or Medicaid?: Yes   The encounter with the patient was in whole, or in part, for the following medical condition, which is the primary reason for home health care: lumbar radiculopathy   Reason for Medically Necessary Home Health Services: Therapy- Personnel officer, Training and development officer and Stair Training   My clinical findings support the need for the above services:  Pain interferes with ambulation/mobility Can transfer bed to chair only     I certify that, based on my findings, the following services are medically necessary home health services: Physical therapy   Further, I certify that my clinical findings support that this patient is homebound due to: Pain interferes with ambulation/mobility      Allergies as of 07/04/2022       Reactions   Hydrocodone Other (See Comments)   Palpitations, increased heart rate   Atorvastatin    Aching and feeling down  Crestor [rosuvastatin] Other (See Comments)   Muscle aches   Lescol  [fluvastatin]    Muscle Aches        Medication List     STOP taking these medications    oxyCODONE-acetaminophen 5-325 MG tablet Commonly known  as: PERCOCET/ROXICET       TAKE these medications    cyclobenzaprine 10 MG tablet Commonly known as: FLEXERIL Take 1 tablet (10 mg total) by mouth 3 (three) times daily as needed for muscle spasms.   ezetimibe 10 MG tablet Commonly known as: ZETIA Take 1 tablet (10 mg total) by mouth daily.   gabapentin 100 MG capsule Commonly known as: NEURONTIN Take 1 capsule (100 mg total) by mouth at bedtime. What changed: Another medication with the same name was added. Make sure you understand how and when to take each.   gabapentin 300 MG capsule Commonly known as: NEURONTIN Take 2 capsules (600 mg total) by mouth 3 (three) times daily. What changed: You were already taking a medication with the same name, and this prescription was added. Make sure you understand how and when to take each.   hydrochlorothiazide 50 MG tablet Commonly known as: HYDRODIURIL TAKE 1 TABLET BY MOUTH EVERY DAY   lovastatin 40 MG tablet Commonly known as: MEVACOR TAKE 1 TABLET BY MOUTH EVERY DAY   methocarbamol 500 MG tablet Commonly known as: ROBAXIN Take 500 mg by mouth 4 (four) times daily as needed.   methylPREDNISolone 4 MG Tbpk tablet Commonly known as: MEDROL DOSEPAK Take as per manufacturer's directions   omeprazole 40 MG capsule Commonly known as: PRILOSEC TAKE 1 CAPSULE(40 MG) BY MOUTH DAILY What changed: See the new instructions.   OneTouch Delica Lancets 16X Misc Use to check blood sugar once a day.  DX: E11.9   OneTouch Verio test strip Generic drug: glucose blood TEST ONCE DAILY  Dx: E11.9   oxyCODONE 10 mg 12 hr tablet Commonly known as: OXYCONTIN Take 1 tablet (10 mg total) by mouth every 12 (twelve) hours.   Oxycodone HCl 10 MG Tabs Take 1 tablet (10 mg total) by mouth every 6 (six) hours as needed for moderate pain.               Durable Medical Equipment  (From admission, onward)           Start     Ordered   07/04/22 1048  For home use only DME Bedside  commode  Once       Question:  Patient needs a bedside commode to treat with the following condition  Answer:  Weakness   07/04/22 1047   07/04/22 1005  For home use only DME 3 n 1  Once        07/04/22 1004   07/04/22 1005  For home use only DME Walker rolling  Once       Question Answer Comment  Walker: With [place]   Patient needs a walker to treat with the following condition Gait difficulty      07/04/22 1004            Contact information for follow-up providers     Care, [facility] Follow up.   Specialty: Home Health Services Why: Home health arranged.They will contact you within 48hours post discharge Contact information: [place] [address] [phone]              Contact information for after-discharge care     Destination  [facility] .   Service: Skilled Nursing Contact information: [address] [place] [phone]                     Signed: Marvis Moeller 07/04/2022, 3:35 PM

## 2022-07-04 NOTE — Progress Notes (Signed)
    Durable Medical Equipment  (From admission, onward)           Start     Ordered   07/04/22 1048  For home use only DME Bedside commode  Once       Question:  Patient needs a bedside commode to treat with the following condition  Answer:  Weakness   07/04/22 1047   07/04/22 1005  For home use only DME 3 n 1  Once        07/04/22 1004   07/04/22 1005  For home use only DME Walker rolling  Once       Question Answer Comment  Walker: With [place]   Patient needs a walker to treat with the following condition Gait difficulty      07/04/22 1004

## 2022-07-04 NOTE — TOC Progression Note (Signed)
Transition of Care [facility]) - Progression Note    Patient Details  Name: Thomas Morgan MRN: [id number] Date of Birth: [birth year]  Transition of Care [facility]) CM/SW [place], LCSW Phone Number: 07/04/2022, 8:56 AM  Clinical Narrative:    Insurance approval received for Thomas Morgan, Ref# [id number], Auth ID# [id number], effective 07/02/2022-07/05/2022. CSW will continue to follow for medical readiness.    Expected Discharge Plan: [address] Barriers to Discharge: Continued Medical Work up  Expected Discharge Plan and Services Expected Discharge Plan: [place] arrangements for the past 2 months: [place]                                       Social Determinants of Health (SDOH) Interventions    Readmission Risk Interventions     No data to display

## 2022-07-04 NOTE — Progress Notes (Signed)
Physical Therapy Treatment Patient Details Name: Thomas Morgan MRN: [id number] DOB: 06-13-1951 Today's Date: 07/04/2022   History of Present Illness Pt is a 71 y/o male admitted secondary to increased back and RLE pain. Pt with recent laminectomy on 8/17. R hip steroid injection 06/30/22. PMH includes basal cell carcinoma.    PT Comments    Pt with much improved mobility today, with significantly less RLE pain. Pt ambulatory in hallway with use of RW and supervision for safety, pt with x1 bout of RLE weakness which he corrected with use of RW and cuing from PT. Pt appropriate to d/c home with HHPT, recommendations updated and pt/family in agreement. PT to conitnue to follow while acute.     Recommendations for follow up therapy are one component of a multi-disciplinary discharge planning process, led by the attending physician.  Recommendations may be updated based on patient status, additional functional criteria and insurance authorization.  Follow Up Recommendations  Home health PT Can patient physically be transported by private vehicle: Yes   Assistance Recommended at Discharge Set up Supervision/Assistance  Patient can return home with the following A little help with walking and/or transfers;A little help with bathing/dressing/bathroom   Equipment Recommendations  Rolling walker (2 wheels);BSC/3in1 (orders placed)    Recommendations for Other Services       Precautions / Restrictions Precautions Precautions: Fall;Back Precaution Booklet Issued: Yes (comment) Restrictions Weight Bearing Restrictions: No     Mobility  Bed Mobility               General bed mobility comments: up in chair    Transfers Overall transfer level: Needs assistance Equipment used: Rolling walker (2 wheels) Transfers: Sit to/from Stand Sit to Stand: Supervision           General transfer comment: for safety, STS x2 from chair    Ambulation/Gait Ambulation/Gait assistance:  Supervision Gait Distance (Feet): 70 Feet Assistive device: Rolling walker (2 wheels) Gait Pattern/deviations: Step-through pattern, Decreased stride length Gait velocity: decr     General Gait Details: cues for positioning in and proximity to [employer] Mobility    Modified Rankin (Stroke Patients Only)       Balance Overall balance assessment: Needs assistance   Sitting balance-Leahy Scale: Fair     Standing balance support: Bilateral upper extremity supported, Reliant on assistive device for balance Standing balance-Leahy Scale: Poor                              Cognition Arousal/Alertness: Awake/alert Behavior During Therapy: Flat affect Overall Cognitive Status: Within Functional Limits for tasks assessed                                          Exercises      General Comments        Pertinent Vitals/Pain Pain Assessment Pain Assessment: Faces Faces Pain Scale: Hurts little more Pain Location: R LE Pain Descriptors / Indicators: Radiating, Shooting, Burning, Cramping, Moaning Pain Intervention(s): Limited activity within patient's tolerance, Monitored during session, Repositioned    Home Living                          Prior Function  PT Goals (current goals can now be found in the care plan section) Acute Rehab PT Goals Patient Stated Goal: to decrease pain PT Goal Formulation: With patient Time For Goal Achievement: 07/04/22 Potential to Achieve Goals: Good Progress towards PT goals: Progressing toward goals    Frequency    Min 4X/week      PT Plan Current plan remains appropriate    Co-evaluation              AM-PAC PT "6 Clicks" Mobility   Outcome Measure  Help needed turning from your back to your side while in a flat bed without using bedrails?: None Help needed moving from lying on your back to sitting on the side of a flat bed without  using bedrails?: None Help needed moving to and from a bed to a chair (including a wheelchair)?: A Little Help needed standing up from a chair using your arms (e.g., wheelchair or bedside chair)?: A Little Help needed to walk in hospital room?: A Little Help needed climbing 3-5 steps with a railing? : A Little 6 Click Score: 20    End of Session   Activity Tolerance: Patient tolerated treatment well Patient left: in bed;with call bell/phone within reach;with bed alarm set;with family/visitor present Nurse Communication: Mobility status PT Visit Diagnosis: Difficulty in walking, not elsewhere classified (R26.2);Pain Pain - Right/Left: Right Pain - part of body: Leg     Time: 7793-9030 PT Time Calculation (min) (ACUTE ONLY): 17 min  Charges:  $Gait Training: 8-22 mins                     Stacie Glaze, PT DPT Acute Rehabilitation Services Pager [phone]  Office [phone]    [place] E Ruffin Pyo 07/04/2022, 10:35 AM

## 2022-07-08 ENCOUNTER — Telehealth: Payer: Self-pay | Admitting: Family Medicine

## 2022-07-08 NOTE — Telephone Encounter (Signed)
Copied from [place] [phone]. Topic: Quick Communication - Home Health Verbal Orders >> Jul 08, 2022  9:29 AM Cyndi Bender wrote: Caller/Agency: Amy with [facility] Number: [phone] Requesting OT/PT/Skilled Nursing/Social Work/Speech Therapy: PT Frequency: 2 x 1 week and 1 x 4 weeks - also requesting parameters for heart rate

## 2022-07-08 NOTE — Telephone Encounter (Signed)
Verbal orders given  

## 2022-07-08 NOTE — Telephone Encounter (Signed)
Home Health Verbal Orders - Caller/Agency: [place] Number: [phone]  Requesting OT/PT/Skilled Nursing/Social Work/Speech Therapy: OT  Frequency: 2 week 2

## 2022-07-08 NOTE — Telephone Encounter (Signed)
Well, I'm fine with that, but I haven't seen this patient in over a year and he has not  scheduled a hospital follow up.

## 2022-07-08 NOTE — Telephone Encounter (Signed)
That's fine

## 2022-07-11 ENCOUNTER — Telehealth: Payer: Medicare PPO | Admitting: Family Medicine

## 2022-07-11 DIAGNOSIS — M5416 Radiculopathy, lumbar region: Secondary | ICD-10-CM | POA: Diagnosis not present

## 2022-07-11 MED ORDER — METHYLPREDNISOLONE 4 MG PO TBPK: ORAL_TABLET | Freq: Every day | ORAL | 0 refills | Status: DC

## 2022-07-11 NOTE — Progress Notes (Unsigned)
MyChart Video Visit    Virtual Visit via Video Note   This format is felt to be most appropriate for this patient at this time. Physical exam was limited by quality of the video and audio technology used for the visit.   Patient location: home Provider location: [facility]  I discussed the limitations of evaluation and management by telemedicine and the availability of in person appointments. The patient expressed understanding and agreed to proceed.  Patient: Thomas Morgan   DOB: 11-Jul-1951   [name]  MRN: [id number] Visit Date: 07/11/2022  Today's healthcare provider: Lelon Huh, MD   Chief Complaint  Patient presents with   Follow-up   Subjective    HPI  Follow up Hospitalization  Patient was admitted to [facility]  on 06/18/2022 and discharged on 07/04/2022. He was treated for Lumbar radiculopathy. Treatment for this included steroids: Decadron, right IR hip injection. Home health service was ordered during discharge.  Telephone follow up was not done. He reports good compliance with treatment. He reports this condition is improved. Patient is taking pain medications and home health services have started   lumbar laminectomy first week of August [facility] Dr. Reatha Armour  still having butt pain down into back of right leg -----------------------------------------------------------------------------------------      Medications: Outpatient Medications Prior to Visit  Medication Sig   cyclobenzaprine (FLEXERIL) 10 MG tablet Take 1 tablet (10 mg total) by mouth 3 (three) times daily as needed for muscle spasms.   ezetimibe (ZETIA) 10 MG tablet Take 1 tablet (10 mg total) by mouth daily.   gabapentin (NEURONTIN) 300 MG capsule Take 2 capsules (600 mg total) by mouth 3 (three) times daily.   glucose blood (ONETOUCH VERIO) test strip TEST ONCE DAILY  Dx: E11.9   hydrochlorothiazide (HYDRODIURIL) 50 MG tablet TAKE 1 TABLET BY MOUTH EVERY DAY    lovastatin (MEVACOR) 40 MG tablet TAKE 1 TABLET BY MOUTH EVERY DAY   methocarbamol (ROBAXIN) 500 MG tablet Take 500 mg by mouth 4 (four) times daily as needed.   methylPREDNISolone (MEDROL DOSEPAK) 4 MG TBPK tablet Take as per manufacturer's directions   omeprazole (PRILOSEC) 40 MG capsule TAKE 1 CAPSULE(40 MG) BY MOUTH DAILY (Patient taking differently: Take 40 mg by mouth daily.)   OneTouch Delica Lancets 01V MISC Use to check blood sugar once a day.  DX: E11.9   oxyCODONE 10 MG TABS Take 1 tablet (10 mg total) by mouth every 6 (six) hours as needed for moderate pain.   oxyCODONE (OXYCONTIN) 10 mg 12 hr tablet Take 1 tablet (10 mg total) by mouth every 12 (twelve) hours.   [DISCONTINUED] gabapentin (NEURONTIN) 100 MG capsule Take 1 capsule (100 mg total) by mouth at bedtime.   No facility-administered medications prior to visit.    Review of Systems  Constitutional:  Negative for appetite change, chills and fever.  Respiratory:  Negative for chest tightness, shortness of breath and wheezing.   Cardiovascular:  Negative for chest pain and palpitations.  Gastrointestinal:  Negative for abdominal pain, nausea and vomiting.  Musculoskeletal:  Positive for back pain.    {Labs  Heme  Chem  Endocrine  Serology  Results Review (optional):23779}   Objective    There were no vitals taken for this visit.  {Show previous vital signs (optional):23777}   Physical Exam     Assessment & Plan     ***  No follow-ups on file.     I discussed the  assessment and treatment plan with the patient. The patient was provided an opportunity to ask questions and all were answered. The patient agreed with the plan and demonstrated an understanding of the instructions.   The patient was advised to call back or seek an in-person evaluation if the symptoms worsen or if the condition fails to improve as anticipated.  I provided *** minutes of non-face-to-face time during this  encounter.  {provider attestation***:1}  Lelon Huh, MD [facility] [phone] (phone) [phone] (fax)  [place]

## 2022-07-18 DIAGNOSIS — Z87891 Personal history of nicotine dependence: Secondary | ICD-10-CM | POA: Diagnosis not present

## 2022-07-18 DIAGNOSIS — Z4789 Encounter for other orthopedic aftercare: Secondary | ICD-10-CM | POA: Diagnosis not present

## 2022-07-18 DIAGNOSIS — M5416 Radiculopathy, lumbar region: Secondary | ICD-10-CM | POA: Diagnosis not present

## 2022-07-18 DIAGNOSIS — Z9181 History of falling: Secondary | ICD-10-CM | POA: Diagnosis not present

## 2022-07-18 DIAGNOSIS — Z85828 Personal history of other malignant neoplasm of skin: Secondary | ICD-10-CM | POA: Diagnosis not present

## 2022-07-18 DIAGNOSIS — M9983 Other biomechanical lesions of lumbar region: Secondary | ICD-10-CM | POA: Diagnosis not present

## 2022-07-18 DIAGNOSIS — Z79891 Long term (current) use of opiate analgesic: Secondary | ICD-10-CM | POA: Diagnosis not present

## 2022-07-25 ENCOUNTER — Other Ambulatory Visit: Payer: Self-pay | Admitting: Family Medicine

## 2022-07-25 DIAGNOSIS — I10 Essential (primary) hypertension: Secondary | ICD-10-CM

## 2022-07-25 DIAGNOSIS — E785 Hyperlipidemia, unspecified: Secondary | ICD-10-CM

## 2022-07-27 ENCOUNTER — Other Ambulatory Visit: Payer: Self-pay | Admitting: Neurological Surgery

## 2022-07-27 DIAGNOSIS — Z6825 Body mass index (BMI) 25.0-25.9, adult: Secondary | ICD-10-CM | POA: Diagnosis not present

## 2022-07-27 DIAGNOSIS — M48062 Spinal stenosis, lumbar region with neurogenic claudication: Secondary | ICD-10-CM

## 2022-07-27 DIAGNOSIS — M4316 Spondylolisthesis, lumbar region: Secondary | ICD-10-CM | POA: Diagnosis not present

## 2022-08-02 ENCOUNTER — Ambulatory Visit
Admission: RE | Admit: 2022-08-02 | Discharge: 2022-08-02 | Disposition: A | Discharge status: Home / Self Care | Payer: Medicare PPO | Source: Ambulatory Visit | Attending: Neurological Surgery | Admitting: Neurological Surgery

## 2022-08-02 DIAGNOSIS — M4317 Spondylolisthesis, lumbosacral region: Secondary | ICD-10-CM | POA: Diagnosis not present

## 2022-08-02 DIAGNOSIS — M5126 Other intervertebral disc displacement, lumbar region: Secondary | ICD-10-CM | POA: Diagnosis not present

## 2022-08-02 DIAGNOSIS — M48062 Spinal stenosis, lumbar region with neurogenic claudication: Secondary | ICD-10-CM

## 2022-08-02 DIAGNOSIS — M4807 Spinal stenosis, lumbosacral region: Secondary | ICD-10-CM | POA: Diagnosis not present

## 2022-08-02 DIAGNOSIS — I714 Abdominal aortic aneurysm, without rupture, unspecified: Secondary | ICD-10-CM | POA: Diagnosis not present

## 2022-08-02 MED ORDER — GADOBENATE DIMEGLUMINE 529 MG/ML IV SOLN
15.0000 mL | Freq: Once | INTRAVENOUS | Status: AC | PRN
  Administered 2022-08-02: 15 mL via INTRAVENOUS

## 2022-08-22 ENCOUNTER — Encounter (INDEPENDENT_AMBULATORY_CARE_PROVIDER_SITE_OTHER): Payer: Self-pay

## 2022-08-30 DIAGNOSIS — D225 Melanocytic nevi of trunk: Secondary | ICD-10-CM | POA: Diagnosis not present

## 2022-08-30 DIAGNOSIS — Z85828 Personal history of other malignant neoplasm of skin: Secondary | ICD-10-CM | POA: Diagnosis not present

## 2022-08-30 DIAGNOSIS — L570 Actinic keratosis: Secondary | ICD-10-CM | POA: Diagnosis not present

## 2022-08-30 DIAGNOSIS — D2262 Melanocytic nevi of left upper limb, including shoulder: Secondary | ICD-10-CM | POA: Diagnosis not present

## 2022-08-30 DIAGNOSIS — D2261 Melanocytic nevi of right upper limb, including shoulder: Secondary | ICD-10-CM | POA: Diagnosis not present

## 2022-09-14 DIAGNOSIS — Z6827 Body mass index (BMI) 27.0-27.9, adult: Secondary | ICD-10-CM | POA: Diagnosis not present

## 2022-09-14 DIAGNOSIS — T8149XA Infection following a procedure, other surgical site, initial encounter: Secondary | ICD-10-CM | POA: Diagnosis not present

## 2022-09-14 DIAGNOSIS — M48062 Spinal stenosis, lumbar region with neurogenic claudication: Secondary | ICD-10-CM | POA: Diagnosis not present

## 2022-09-21 DIAGNOSIS — H2513 Age-related nuclear cataract, bilateral: Secondary | ICD-10-CM | POA: Diagnosis not present

## 2022-09-21 DIAGNOSIS — E119 Type 2 diabetes mellitus without complications: Secondary | ICD-10-CM | POA: Diagnosis not present

## 2022-09-21 LAB — HM DIABETES EYE EXAM

## 2022-10-03 ENCOUNTER — Other Ambulatory Visit: Payer: Self-pay | Admitting: Family Medicine

## 2022-10-04 NOTE — Telephone Encounter (Signed)
Requested Prescriptions  Pending Prescriptions Disp Refills   ezetimibe (ZETIA) 10 MG tablet [Pharmacy Med Name: EZETIMIBE '10MG'$  TABLETS] 90 tablet 0    Sig: TAKE 1 TABLET(10 MG) BY MOUTH DAILY     Cardiovascular:  Antilipid - Sterol Transport Inhibitors Failed - 10/03/2022  5:03 PM      Failed - Lipid Panel in normal range within the last 12 months    Cholesterol, Total  Date Value Ref Range Status  04/28/2021 164 100 - 199 mg/dL Final   LDL Chol Calc (NIH)  Date Value Ref Range Status  04/28/2021 103 (H) 0 - 99 mg/dL Final   HDL  Date Value Ref Range Status  04/28/2021 29 (L) >39 mg/dL Final   Triglycerides  Date Value Ref Range Status  04/28/2021 182 (H) 0 - 149 mg/dL Final         Passed - AST in normal range and within 360 days    AST  Date Value Ref Range Status  06/19/2022 15 15 - 41 U/L Final         Passed - ALT in normal range and within 360 days    ALT  Date Value Ref Range Status  06/19/2022 19 0 - 44 U/L Final         Passed - Patient is not pregnant      Passed - Valid encounter within last 12 months    Recent Outpatient Visits           2 months ago Lumbar radiculopathy   [facility] Birdie Sons, MD   1 year ago Annual physical exam   [facility] Birdie Sons, MD   1 year ago Right foot pain   [facility] Birdie Sons, MD   1 year ago Type 2 diabetes mellitus with other circulatory complication, without long-term current use of insulin [facility])   [facility] Birdie Sons, MD   2 years ago Upper respiratory tract infection, unspecified type   [facility] Birdie Sons, MD       Future Appointments             In 3 weeks Fisher, Kirstie Peri, MD [facility]

## 2022-10-28 ENCOUNTER — Ambulatory Visit (INDEPENDENT_AMBULATORY_CARE_PROVIDER_SITE_OTHER): Payer: Medicare PPO | Admitting: Family Medicine

## 2022-10-28 ENCOUNTER — Encounter: Payer: Self-pay | Admitting: Family Medicine

## 2022-10-28 VITALS — BP 140/85 | HR 73 | Wt 192.9 lb

## 2022-10-28 DIAGNOSIS — Z720 Tobacco use: Secondary | ICD-10-CM

## 2022-10-28 DIAGNOSIS — E1159 Type 2 diabetes mellitus with other circulatory complications: Secondary | ICD-10-CM | POA: Diagnosis not present

## 2022-10-28 DIAGNOSIS — Z125 Encounter for screening for malignant neoplasm of prostate: Secondary | ICD-10-CM | POA: Diagnosis not present

## 2022-10-28 DIAGNOSIS — M7551 Bursitis of right shoulder: Secondary | ICD-10-CM | POA: Diagnosis not present

## 2022-10-28 DIAGNOSIS — I10 Essential (primary) hypertension: Secondary | ICD-10-CM | POA: Diagnosis not present

## 2022-10-28 DIAGNOSIS — E785 Hyperlipidemia, unspecified: Secondary | ICD-10-CM | POA: Diagnosis not present

## 2022-10-28 DIAGNOSIS — I2510 Atherosclerotic heart disease of native coronary artery without angina pectoris: Secondary | ICD-10-CM

## 2022-10-28 DIAGNOSIS — Z23 Encounter for immunization: Secondary | ICD-10-CM | POA: Diagnosis not present

## 2022-10-28 DIAGNOSIS — Z86010 Personal history of colonic polyps: Secondary | ICD-10-CM

## 2022-10-28 DIAGNOSIS — Z0000 Encounter for general adult medical examination without abnormal findings: Secondary | ICD-10-CM | POA: Diagnosis not present

## 2022-10-28 DIAGNOSIS — L219 Seborrheic dermatitis, unspecified: Secondary | ICD-10-CM

## 2022-10-28 DIAGNOSIS — J439 Emphysema, unspecified: Secondary | ICD-10-CM | POA: Diagnosis not present

## 2022-10-28 DIAGNOSIS — F5101 Primary insomnia: Secondary | ICD-10-CM

## 2022-10-28 DIAGNOSIS — E559 Vitamin D deficiency, unspecified: Secondary | ICD-10-CM | POA: Diagnosis not present

## 2022-10-28 DIAGNOSIS — I739 Peripheral vascular disease, unspecified: Secondary | ICD-10-CM

## 2022-10-28 DIAGNOSIS — I7143 Infrarenal abdominal aortic aneurysm, without rupture: Secondary | ICD-10-CM

## 2022-10-28 DIAGNOSIS — R351 Nocturia: Secondary | ICD-10-CM | POA: Diagnosis not present

## 2022-10-28 MED ORDER — NEOMYCIN-POLYMYXIN-HC 3.5-10000-1 OT SOLN: 3.0000 [drp] | Freq: Two times a day (BID) | OTIC | 1 refills | Status: AC

## 2022-10-28 NOTE — Progress Notes (Signed)
I,Thomas Morgan,acting as a Education administrator for Thomas Huh, MD.,have documented all relevant documentation on the behalf of Thomas Huh, MD,as directed by  Thomas Huh, MD while in the presence of Thomas Huh, MD.   Complete physical exam   Patient: Thomas Morgan   DOB: 03/01/1951   72 y.o. Male  MRN: [id number] Visit Date: 10/28/2022  Today's healthcare provider: Lelon Huh, MD   No chief complaint on file.  Subjective    Thomas Morgan is a 72 y.o. male who presents today for a complete physical exam.  He reports consuming a  diabetic  diet. The patient does not participate in regular exercise at present. He generally feels well. He reports sleeping poorly. He does not have additional problems to discuss today.  HPI   He is also here to follow up on diabetes, lipids, hypertension and CAD. He doing well on current medications. Is following healthy low sugar diet currently not requiring medications to control sugar.   Lab Results  Component Value Date   HGBA1C 6.6 (H) 10/28/2022   Lab Results  Component Value Date   CHOL 161 10/28/2022   HDL 30 (L) 10/28/2022   LDLCALC 103 (H) 10/28/2022   TRIG 157 (H) 10/28/2022   CHOLHDL 5.4 (H) 10/28/2022   Lab Results  Component Value Date   NA 143 10/28/2022   K 3.7 10/28/2022   CREATININE 0.71 (L) 10/28/2022   EGFR 97 10/28/2022   GFRNONAA >60 07/01/2022   GLUCOSE 131 (H) 10/28/2022   Lab Results  Component Value Date   TSH 3.290 10/28/2022   He also complains of itching in ears.  He also reports pain in the front of his right shoulder which is very uncomfortable when sleeping face down.   Past Medical History:  Diagnosis Date   Basal cell carcinoma    Complication of anesthesia    Personal history of tobacco use, presenting hazards to health 10/22/2015   PONV (postoperative nausea and vomiting)    gets very sick from anesthesia   Spinal stenosis    Past Surgical History:  Procedure Laterality Date   BACK  SURGERY     BASAL CELL CARCINOMA EXCISION Left 2020   left arm   CYST REMOVAL TRUNK  2020   on his back   [facility] Left 03/24/2017   Procedure: EXCISION MORTON'S NEUROMA;  Surgeon: Sharlotte Alamo, DPM;  Location: [facility] ORS;  Service: Podiatry;  Laterality: Left;   FOOT SURGERY Right 04/16/2021   SKIN CANCER EXCISION     WRIST SURGERY Right    Trauma resulting in prosthetics in the right wrist and elbow   Social History   Socioeconomic History   Marital status: Married    Spouse name: Not on file   Number of children: 2   Years of education: Not on file   Highest education level: Bachelor's degree (e.g., BA, AB, BS)  Occupational History   Not on file  Tobacco Use   Smoking status: Every Day    Packs/day: 1.00    Years: 48.00    Total pack years: 48.00    Types: Cigarettes   Smokeless tobacco: Never  Vaping Use   Vaping Use: Never used  Substance and Sexual Activity   Alcohol use: No    Alcohol/week: 0.0 standard drinks of alcohol   Drug use: No   Sexual activity: Not on file  Other Topics Concern   Not on file  Social  History Narrative   Not on file   Social Determinants of Health   Financial Resource Strain: Low Risk  (05/03/2022)   Overall Financial Resource Strain (CARDIA)    Difficulty of Paying Living Expenses: Not hard at all  Food Insecurity: No Food Insecurity (05/03/2022)   Hunger Vital Sign    Worried About Running Out of Food in the Last Year: Never true    Ran Out of Food in the Last Year: Never true  Transportation Needs: No Transportation Needs (05/03/2022)   PRAPARE - Hydrologist (Medical): No    Lack of Transportation (Non-Medical): No  Physical Activity: Insufficiently Active (05/03/2022)   Exercise Vital Sign    Days of Exercise per Week: 3 days    Minutes of Exercise per Session: 30 min  Stress: No Stress Concern Present (05/03/2022)   [place]    Feeling of Stress : Not at all  Social Connections: Moderately Integrated (05/03/2022)   Social Connection and Isolation Panel [NHANES]    Frequency of Communication with Friends and Family: Twice a week    Frequency of Social Gatherings with Friends and Family: Three times a week    Attends Religious Services: More than 4 times per year    Active Member of Clubs or Organizations: No    Attends Archivist Meetings: Never    Marital Status: Married  Human resources officer Violence: Not At Risk (05/03/2022)   Humiliation, Afraid, Rape, and Kick questionnaire    Fear of Current or Ex-Partner: No    Emotionally Abused: No    Physically Abused: No    Sexually Abused: No   Family Status  Relation Name Status   Mother  Deceased at age 56       died from lung cancer;colorectal adenoma   Father  Deceased at age 29       AAA rupture   Brother  Alive   Brother  Alive   Family History  Problem Relation Age of Onset   Cancer Mother        Lung   Hypertension Mother    Heart Problems Father    AAA (abdominal aortic aneurysm) Father    Heart attack Brother    Hypercholesterolemia Brother    Allergies  Allergen Reactions   Hydrocodone Other (See Comments)    Palpitations, increased heart rate   Atorvastatin     Aching and feeling down   Crestor [Rosuvastatin] Other (See Comments)    Muscle aches   Lescol  [Fluvastatin]     Muscle Aches    Patient Care Team: Birdie Sons, MD as PCP - General (Family Medicine) Vladimir Crofts, MD as Consulting Physician (Neurology) Erline Levine, MD as Consulting Physician (Neurosurgery) Marty Heck, MD as Consulting Physician (Vascular Surgery)   Medications: Outpatient Medications Prior to Visit  Medication Sig   cyclobenzaprine (FLEXERIL) 10 MG tablet Take 1 tablet (10 mg total) by mouth 3 (three) times daily as needed for muscle spasms.   ezetimibe (ZETIA) 10 MG tablet TAKE 1 TABLET(10 MG)  BY MOUTH DAILY   gabapentin (NEURONTIN) 300 MG capsule Take 2 capsules (600 mg total) by mouth 3 (three) times daily.   glucose blood (ONETOUCH VERIO) test strip TEST ONCE DAILY  Dx: E11.9   hydrochlorothiazide (HYDRODIURIL) 50 MG tablet TAKE 1 TABLET BY MOUTH EVERY DAY   lovastatin (MEVACOR) 40 MG tablet TAKE 1 TABLET BY MOUTH EVERY DAY  omeprazole (PRILOSEC) 40 MG capsule TAKE 1 CAPSULE(40 MG) BY MOUTH DAILY (Patient taking differently: Take 40 mg by mouth daily.)   OneTouch Delica Lancets 06T MISC Use to check blood sugar once a day.  DX: E11.9   methocarbamol (ROBAXIN) 500 MG tablet Take 500 mg by mouth 4 (four) times daily as needed. (Patient not taking: Reported on 10/28/2022)   methylPREDNISolone (MEDROL DOSEPAK) 4 MG TBPK tablet Take as per manufacturer's directions (Patient not taking: Reported on 10/28/2022)   methylPREDNISolone (MEDROL DOSEPAK) 4 MG TBPK tablet Take by mouth daily. (Patient not taking: Reported on 10/28/2022)   oxyCODONE (OXYCONTIN) 10 mg 12 hr tablet Take 1 tablet (10 mg total) by mouth every 12 (twelve) hours.   oxyCODONE 10 MG TABS Take 1 tablet (10 mg total) by mouth every 6 (six) hours as needed for moderate pain. (Patient not taking: Reported on 10/28/2022)   No facility-administered medications prior to visit.    Review of Systems  Constitutional:  Negative for chills, diaphoresis and fever.  HENT:  Negative for congestion, ear discharge, ear pain, hearing loss, nosebleeds, sore throat and tinnitus.   Eyes:  Positive for pain. Negative for photophobia, discharge and redness.  Respiratory:  Negative for cough, shortness of breath, wheezing and stridor.   Cardiovascular:  Negative for chest pain, palpitations and leg swelling.  Gastrointestinal:  Negative for abdominal pain, blood in stool, constipation, diarrhea, nausea and vomiting.  Endocrine: Positive for polyuria. Negative for polydipsia.  Genitourinary:  Negative for dysuria, flank pain, frequency, hematuria  and urgency.  Musculoskeletal:  Positive for back pain and gait problem. Negative for myalgias and neck pain.  Skin:  Negative for rash.  Allergic/Immunologic: Negative for environmental allergies.  Neurological:  Positive for numbness. Negative for dizziness, tremors, seizures, weakness and headaches.  Hematological:  Does not bruise/bleed easily.  Psychiatric/Behavioral:  Positive for sleep disturbance. Negative for hallucinations and suicidal ideas. The patient is not nervous/anxious.       Objective    BP (!) 140/85 (BP Location: Left Arm, Patient Position: Sitting, Cuff Size: Normal)   Pulse 73   Wt 192 lb 14.4 oz (87.5 kg)   SpO2 100%   BMI 27.68 kg/m    Physical Exam   General Appearance:    Well developed, well nourished male. Alert, cooperative, in no acute distress, appears stated age  Head:    Normocephalic, without obvious abnormality, atraumatic  Eyes:    PERRL, conjunctiva/corneas clear, EOM's intact, fundi    benign, both eyes       Ears:    Normal TM's and external ear canals, both ears. Seborrhea of both ear canals.   Nose:   Nares normal, septum midline, mucosa normal, no drainage   or sinus tenderness  Throat:   Lips, mucosa, and tongue normal; teeth and gums normal  Neck:   Supple, symmetrical, trachea midline, no adenopathy;       thyroid:  No enlargement/tenderness/nodules; no carotid   bruit or JVD  Back:     Symmetric, no curvature, ROM normal, no CVA tenderness  Lungs:     Clear to auscultation bilaterally, respirations unlabored  Chest wall:    No tenderness or deformity  Heart:    Normal heart rate. Normal rhythm. No murmurs, rubs, or gallops.  S1 and S2 normal  Abdomen:     Soft, non-tender, bowel sounds active all four quadrants,    no masses, no organomegaly  Genitalia:    deferred  Rectal:    deferred  Extremities:   All extremities are intact. No cyanosis or edema. FROM of both upper extremities. Decrease pedal pulses.   Pulses:   2+ and  symmetric all extremities  Skin:   Skin color, texture, turgor normal, no rashes or lesions  Lymph nodes:   Cervical, supraclavicular, and axillary nodes normal  Neurologic:   CNII-XII intact. Normal strength, sensation and reflexes      throughout     Last depression screening scores    05/03/2022    1:27 PM 04/28/2021   10:07 AM 04/21/2020    8:26 AM  PHQ 2/9 Scores  PHQ - 2 Score 0 0 0  PHQ- 9 Score 0 2    Last fall risk screening    05/03/2022    1:29 PM  [place] in the past year? 0  Number falls in past yr: 0  Injury with Fall? 0  Risk for fall due to : No Fall Risks  Follow up Falls evaluation completed   Last Audit-C alcohol use screening    05/03/2022    1:26 PM  Alcohol Use Disorder Test (AUDIT)  1. How often do you have a drink containing alcohol? 0  2. How many drinks containing alcohol do you have on a typical day when you are drinking? 0  3. How often do you have six or more drinks on one occasion? 0  AUDIT-C Score 0   A score of 3 or more in women, and 4 or more in men indicates increased risk for alcohol abuse, EXCEPT if all of the points are from question 1   No results found for any visits on 10/28/22.  Assessment & Plan    Routine Health Maintenance and Physical Exam  Exercise Activities and Dietary recommendations  Goals      DIET - EAT MORE FRUITS AND VEGETABLES     Exercise 150 min/wk Moderate Activity     Recommend to continue exercising 3 days a week for 1 hour.       Quit Smoking     Recommend to continue efforts to reduce smoking habits until no longer smoking.         Immunization History  Administered Date(s) Administered   Fluad Quad(high Dose 65+) 08/13/2019, 10/05/2020, 10/13/2021, 10/13/2021   Influenza, High Dose Seasonal PF 10/08/2015, 07/05/2016, 07/27/2018   Influenza-Unspecified 12/01/2017   PFIZER(Purple Top)SARS-COV-2 Vaccination 12/26/2019, 01/16/2020   Pneumococcal Conjugate-13 10/08/2015   Pneumococcal  Polysaccharide-23 09/10/2008, 12/12/2016   Td 10/24/1997   Tdap 06/06/2007    Health Maintenance  Topic Date Due   Zoster Vaccines- Shingrix (1 of 2) Never done   DTaP/Tdap/Td (3 - Td or Tdap) 06/05/2017   COVID-19 Vaccine (3 - Pfizer risk series) 02/13/2020   HEMOGLOBIN A1C  10/29/2021   COLONOSCOPY (Pts 45-69yr Insurance coverage will need to be confirmed)  12/19/2021   FOOT EXAM  12/23/2021   Diabetic kidney evaluation - Urine ACR  04/28/2022   INFLUENZA VACCINE  05/24/2022   Lung Cancer Screening  12/29/2022   Medicare Annual Wellness (AWV)  05/04/2023   Diabetic kidney evaluation - eGFR measurement  07/02/2023   OPHTHALMOLOGY EXAM  09/22/2023   Pneumonia Vaccine 72 Years old  Completed   Hepatitis C Screening  Completed   HPV VACCINES  Aged Out    Discussed health benefits of physical activity, and encouraged him to engage in regular exercise appropriate for his age and condition.   2. Prostate cancer screening  - PSA Total (Reflex  To Free) ([facility] only)  3. Flu vaccine need  - Flu Vaccine QUAD High Dose(Fluad)  4. Tobacco abuse UTD lung cancer screening.   5. History of adenomatous polyp of colon Last colonoscopy July 2016 - Ambulatory referral to Gastroenterology  6. Primary hypertension Doing well current medications although BP slightly above goal. Is to work on health diet and exercise. Continue current medications.  7. Coronary arteriosclerosis Asymptomatic. Compliant with medication.  Continue aggressive risk factor modification.   - EKG 12-Lead  8. Hyperlipidemia, unspecified hyperlipidemia type He is tolerating lovastatin well with no adverse effects.   - Lipid panel - Comprehensive metabolic panel - CBC with Differential/Platelet - T4, free - TSH  9. Vitamin D deficiency  - VITAMIN D 25 Hydroxy (Vit-D Deficiency, Fractures)  10. Type 2 diabetes mellitus with other circulatory complication, without long-term current use of insulin  (HCC) Diet controlled.  - Urine Microalbumin w/creat. ratio - HgB A1c - EKG 12-Lead  11. Pulmonary emphysema, unspecified emphysema type ([place]) Minimally symptomatic. Not requiring inhalers. Encouraged smoking cessation.   12. Nocturia   13. Seborrhea (ear canals)  - neomycin-polymyxin-hydrocortisone (CORTISPORIN) OTIC solution; Place 3 drops into both ears 2 (two) times daily for 7 days.  Dispense: 10 mL; Refill: 1  14. Bursitis of right shoulder Relative rest. Avoid sleeping on front. Consider orthopedic referral if not improving over 2-3 weeks.   15. Primary insomnia   16. Infrarenal abdominal aortic aneurysm (AAA) without rupture ([place]) Followed annually at [facility]   17. Intermittent claudication ([place]) He would like evaluation of circulation, however he did have normal ABIs by Dr. Delana Meyer in March of 2022 for similar symptoms. He is due for follow up with vascular for AAA in July and may want to discuss symptoms at that visit.     The entirety of the information documented in the History of Present Illness, Review of Systems and Physical Exam were personally obtained by me. Portions of this information were initially documented by the CMA and reviewed by me for thoroughness and accuracy.     Thomas Huh, MD  [facility] [phone] (phone) [phone] (fax)  [place]

## 2022-10-28 NOTE — Patient Instructions (Signed)
.   Please review the attached list of medications and notify my office if there are any errors.   . Please bring all of your medications to every appointment so we can make sure that our medication list is the same as yours.   

## 2022-10-29 LAB — COMPREHENSIVE METABOLIC PANEL
ALT: 20 IU/L (ref 0–44)
AST: 16 IU/L (ref 0–40)
Albumin/Globulin Ratio: 2.6 — ABNORMAL HIGH (ref 1.2–2.2)
Albumin: 4.5 g/dL (ref 3.8–4.8)
Alkaline Phosphatase: 83 IU/L (ref 44–121)
BUN/Creatinine Ratio: 18 (ref 10–24)
BUN: 13 mg/dL (ref 8–27)
Bilirubin Total: 0.4 mg/dL (ref 0.0–1.2)
CO2: 28 mmol/L (ref 20–29)
Calcium: 9.7 mg/dL (ref 8.6–10.2)
Chloride: 100 mmol/L (ref 96–106)
Creatinine, Ser: 0.71 mg/dL — ABNORMAL LOW (ref 0.76–1.27)
Globulin, Total: 1.7 g/dL (ref 1.5–4.5)
Glucose: 131 mg/dL — ABNORMAL HIGH (ref 70–99)
Potassium: 3.7 mmol/L (ref 3.5–5.2)
Sodium: 143 mmol/L (ref 134–144)
Total Protein: 6.2 g/dL (ref 6.0–8.5)
eGFR: 97 mL/min/{1.73_m2} (ref >59)

## 2022-10-29 LAB — LIPID PANEL
Chol/HDL Ratio: 5.4 ratio — ABNORMAL HIGH (ref 0.0–5.0)
Cholesterol, Total: 161 mg/dL (ref 100–199)
HDL: 30 mg/dL — ABNORMAL LOW (ref >39)
LDL Chol Calc (NIH): 103 mg/dL — ABNORMAL HIGH (ref 0–99)
Triglycerides: 157 mg/dL — ABNORMAL HIGH (ref 0–149)
VLDL Cholesterol Cal: 28 mg/dL (ref 5–40)

## 2022-10-29 LAB — CBC WITH DIFFERENTIAL/PLATELET
Basophils Absolute: 0.1 10*3/uL (ref 0.0–0.2)
Basos: 1 %
EOS (ABSOLUTE): 0.4 10*3/uL (ref 0.0–0.4)
Eos: 3 %
Hematocrit: 43.1 % (ref 37.5–51.0)
Hemoglobin: 14.7 g/dL (ref 13.0–17.7)
Immature Grans (Abs): 0 10*3/uL (ref 0.0–0.1)
Immature Granulocytes: 0 %
Lymphocytes Absolute: 2.5 10*3/uL (ref 0.7–3.1)
Lymphs: 23 %
MCH: 31.5 pg (ref 26.6–33.0)
MCHC: 34.1 g/dL (ref 31.5–35.7)
MCV: 92 fL (ref 79–97)
Monocytes Absolute: 0.7 10*3/uL (ref 0.1–0.9)
Monocytes: 6 %
Neutrophils Absolute: 7 10*3/uL (ref 1.4–7.0)
Neutrophils: 67 %
Platelets: 394 10*3/uL (ref 150–450)
RBC: 4.67 x10E6/uL (ref 4.14–5.80)
RDW: 11.2 % — ABNORMAL LOW (ref 11.6–15.4)
WBC: 10.6 10*3/uL (ref 3.4–10.8)

## 2022-10-29 LAB — TSH: TSH: 3.29 u[IU]/mL (ref 0.450–4.500)

## 2022-10-29 LAB — HEMOGLOBIN A1C
Est. average glucose Bld gHb Est-mCnc: 143 mg/dL
Hgb A1c MFr Bld: 6.6 % — ABNORMAL HIGH (ref 4.8–5.6)

## 2022-10-29 LAB — T4, FREE: Free T4: 1.06 ng/dL (ref 0.82–1.77)

## 2022-10-29 LAB — PSA TOTAL (REFLEX TO FREE): Prostate Specific Ag, Serum: 0.3 ng/mL (ref 0.0–4.0)

## 2022-10-29 LAB — VITAMIN D 25 HYDROXY (VIT D DEFICIENCY, FRACTURES): Vit D, 25-Hydroxy: 31 ng/mL (ref 30.0–100.0)

## 2022-11-07 ENCOUNTER — Telehealth: Payer: Self-pay

## 2022-11-07 NOTE — Telephone Encounter (Signed)
Copied from [place] [phone]. Topic: Referral - Request for Referral >> Nov 07, 2022  2:37 PM Sabas Sous wrote: Has patient seen PCP for this complaint? Yes.   *If NO, is insurance requiring patient see PCP for this issue before PCP can refer them? Referral for which specialty: Vein and Vascular  Preferred provider/office: [facility]  Reason for referral: Had a referral already that was closed and he does not know why. I73.9 (ICD-10-CM) - Intermittent claudication ([place])

## 2022-11-09 NOTE — Telephone Encounter (Signed)
[email or web address]

## 2022-11-09 NOTE — Telephone Encounter (Addendum)
After reviewing his records he had thorough evaluation of circulation by Dr. Delana Meyer in 2022 which was completely normal. Also had circulation checked in 2018 which was normal. Symptoms were felt to be related to his back problems.  It's unlikely to have changed since then, but if his symptoms have gotten worse since then we could send him back.

## 2022-11-09 NOTE — Telephone Encounter (Signed)
Patient advised that referral is not needed at this time.

## 2022-11-23 ENCOUNTER — Ambulatory Visit: Payer: Medicare PPO | Admitting: Podiatry

## 2022-11-23 DIAGNOSIS — G5762 Lesion of plantar nerve, left lower limb: Secondary | ICD-10-CM | POA: Diagnosis not present

## 2022-11-23 DIAGNOSIS — G5782 Other specified mononeuropathies of left lower limb: Secondary | ICD-10-CM

## 2022-11-23 MED ORDER — TRIAMCINOLONE ACETONIDE 40 MG/ML IJ SUSP
20.0000 mg | Freq: Once | INTRAMUSCULAR | Status: AC
  Administered 2022-11-23: 20 mg

## 2022-11-23 NOTE — Progress Notes (Signed)
He states that his neuroma returned about Christmas.  He states he was doing great up into that point and all of a sudden it came back.  Objective: Vital signs stable he is alert and oriented x 3.  Pulses are palpable.  Palpable neuroma third interspace left foot.  Assessment: Neuroma third interspace left foot.  Plan: I injected the area today with 10 mg Kenalog and local anesthetic.  He tolerated procedure well.  We did discuss the need for surgical intervention should this not alleviate his symptoms at this point.

## 2022-11-30 ENCOUNTER — Ambulatory Visit: Payer: Medicare PPO | Admitting: Podiatry

## 2022-12-05 ENCOUNTER — Other Ambulatory Visit: Payer: Self-pay | Admitting: Family Medicine

## 2022-12-06 NOTE — Telephone Encounter (Signed)
Had physical 1 month ago.   Gave a year of refills  Requested Prescriptions  Pending Prescriptions Disp Refills   omeprazole (PRILOSEC) 40 MG capsule [Pharmacy Med Name: OMEPRAZOLE 40MG CAPSULES] 90 capsule 4    Sig: TAKE 1 CAPSULE(40 MG) BY MOUTH DAILY     Gastroenterology: Proton Pump Inhibitors Passed - 12/05/2022  8:50 AM      Passed - Valid encounter within last 12 months    Recent Outpatient Visits           1 month ago Annual physical exam   [place] Birdie Sons, MD   4 months ago Lumbar radiculopathy   [place] [facility] Birdie Sons, MD   1 year ago Annual physical exam   [place], Donald E, MD   1 year ago Right foot pain   [place], Donald E, MD   2 years ago Type 2 diabetes mellitus with other circulatory complication, without long-term current use of insulin [facility])    [facility] Caryn Section, Kirstie Peri, MD

## 2022-12-12 ENCOUNTER — Telehealth: Payer: Medicare PPO | Admitting: Emergency Medicine

## 2022-12-12 DIAGNOSIS — J069 Acute upper respiratory infection, unspecified: Secondary | ICD-10-CM

## 2022-12-12 DIAGNOSIS — M48062 Spinal stenosis, lumbar region with neurogenic claudication: Secondary | ICD-10-CM | POA: Diagnosis not present

## 2022-12-12 DIAGNOSIS — M5416 Radiculopathy, lumbar region: Secondary | ICD-10-CM | POA: Diagnosis not present

## 2022-12-12 MED ORDER — FLUTICASONE PROPIONATE 50 MCG/ACT NA SUSP: 2.0000 | Freq: Every day | NASAL | 0 refills | Status: DC

## 2022-12-12 MED ORDER — BENZONATATE 100 MG PO CAPS: 100.0000 mg | ORAL_CAPSULE | Freq: Two times a day (BID) | ORAL | 0 refills | Status: DC | PRN

## 2022-12-12 NOTE — Progress Notes (Signed)
E-Visit for Upper Respiratory Infection   We are sorry you are not feeling well.  Here is how we plan to help!  Based on what you have shared with me, it looks like you may have a viral upper respiratory infection.  Upper respiratory infections are caused by a large number of viruses; however, rhinovirus is the most common cause. COVID also can cause these symptoms - I recommend you test yourself for COVID at home.   Symptoms vary from person to person, with common symptoms including sore throat, cough, fatigue or lack of energy and feeling of general discomfort.  A low-grade fever of up to 100.4 may present, but is often uncommon.  Symptoms vary however, and are closely related to a person's age or underlying illnesses.  The most common symptoms associated with an upper respiratory infection are nasal discharge or congestion, cough, sneezing, headache and pressure in the ears and face.  These symptoms usually persist for about 3 to 10 days, but can last up to 2 weeks.  It is important to know that upper respiratory infections do not cause serious illness or complications in most cases.    Upper respiratory infections can be transmitted from person to person, with the most common method of transmission being a person's hands.  The virus is able to live on the skin and can infect other persons for up to 2 hours after direct contact.  Also, these can be transmitted when someone coughs or sneezes; thus, it is important to cover the mouth to reduce this risk.  To keep the spread of the illness at [place], good hand hygiene is very important.  This is an infection that is most likely caused by a virus. There are no specific treatments other than to help you with the symptoms until the infection runs its course.  We are sorry you are not feeling well.  Here is how we plan to help!   For nasal congestion, you may continue to use plain Mucinex.    Saline nasal spray or nasal drops can help and can safely be used  as often as needed for congestion.   For your congestion, I have prescribed Fluticasone nasal spray one spray in each nostril twice a day  If you do not have a history of heart disease, hypertension, diabetes or thyroid disease, prostate/bladder issues or glaucoma, you may also use Sudafed to treat nasal congestion.  It is highly recommended that you consult with a pharmacist or your primary care physician to ensure this medication is safe for you to take.     If you have a cough, you may use cough suppressants such as Delsym and Robitussin.  If you have glaucoma or high blood pressure, you can also use Coricidin HBP.   For cough I have prescribed for you A prescription cough medication called Tessalon Perles 100 mg. You may take 1-2 capsules every 8 hours as needed for cough  If you have a sore or scratchy throat, use a saltwater gargle-  to  teaspoon of salt dissolved in a 4-ounce to 8-ounce glass of warm water.  Gargle the solution for approximately 15-30 seconds and then spit.  It is important not to swallow the solution.  You can also use throat lozenges/cough drops and Chloraseptic spray to help with throat pain or discomfort.  Warm or cold liquids can also be helpful in relieving throat pain.  For headache, pain or general discomfort, you can use Ibuprofen or Tylenol as directed.  Some authorities believe that zinc sprays or the use of Echinacea may shorten the course of your symptoms.   HOME CARE Only take medications as instructed by your medical team. Be sure to drink plenty of fluids. Water is fine as well as fruit juices, sodas and electrolyte beverages. You may want to stay away from caffeine or alcohol. If you are nauseated, try taking small sips of liquids. How do you know if you are getting enough fluid? Your urine should be a pale yellow or almost colorless. Get rest. Taking a steamy shower or using a humidifier may help nasal congestion and ease sore throat pain. You can place  a towel over your head and breathe in the steam from hot water coming from a faucet. Using a saline nasal spray works much the same way. Cough drops, hard candies and sore throat lozenges may ease your cough. Avoid close contacts especially the very young and the elderly Cover your mouth if you cough or sneeze Always remember to wash your hands.   GET HELP RIGHT AWAY IF: You develop worsening fever. If your symptoms do not improve within 10 days You develop yellow or green discharge from your nose over 3 days. You have coughing fits You develop a severe head ache or visual changes. You develop shortness of breath, difficulty breathing or start having chest pain Your symptoms persist after you have completed your treatment plan  MAKE SURE YOU  Understand these instructions. Will watch your condition. Will get help right away if you are not doing well or get worse.  Thank you for choosing an e-visit.  Your e-visit answers were reviewed by a board certified advanced clinical practitioner to complete your personal care plan. Depending upon the condition, your plan could have included both over the counter or prescription medications.  Please review your pharmacy choice. Make sure the pharmacy is open so you can pick up prescription now. If there is a problem, you may contact your provider through [employer] and have the prescription routed to another pharmacy.  Your safety is important to [place]. If you have drug allergies check your prescription carefully.   For the next 24 hours you can use MyChart to ask questions about today's visit, request a non-urgent call back, or ask for a work or school excuse. You will get an email in the next two days asking about your experience. I hope that your e-visit has been valuable and will speed your recovery.   I have spent 5 minutes in review of e-visit questionnaire, review and updating patient chart, medical decision making and response to  patient.   Willeen Cass, PhD, FNP-BC

## 2022-12-21 ENCOUNTER — Encounter: Payer: Self-pay | Admitting: Podiatry

## 2022-12-21 ENCOUNTER — Ambulatory Visit: Payer: Medicare PPO | Admitting: Podiatry

## 2022-12-21 DIAGNOSIS — G5782 Other specified mononeuropathies of left lower limb: Secondary | ICD-10-CM

## 2022-12-21 NOTE — Progress Notes (Unsigned)
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  He states he does not want to go through all of the dehydrated alcohol shots like he did previously.  Objective: Vital signs are stable he is alert and oriented x 3 palpable Mulder's click third interspace of the left foot exquisitely tender on palpation.  Assessment: Pain in limb secondary to neuroma third interspace left foot.  Plan: Discussed etiology pathology and surgical therapies.  Consented him for excision neuroma third interspace of the left foot.  This will be a redo from Dr. Caryl Comes.  We did once again discussed the possible postop complications associated with this.  He understands and is amenable to it.  I discussed anesthesia.  We discussed the surgery center.  He was dispensed both oral and written instructions.  He signed a 3 pages of consent form and I will follow-up with him in the near future for surgery.

## 2022-12-26 ENCOUNTER — Telehealth: Payer: Self-pay | Admitting: Urology

## 2022-12-26 NOTE — Telephone Encounter (Signed)
DOS - 12/30/22  NEURECTOMY LEFT --- 28080  [facility]  PER [place] WEBSITE FOR CPT CODE 91478 NO PRIOR AUTH IS REQUIRED.

## 2022-12-29 ENCOUNTER — Ambulatory Visit
Admission: RE | Admit: 2022-12-29 | Discharge: 2022-12-29 | Disposition: A | Discharge status: Home / Self Care | Payer: Medicare PPO | Source: Ambulatory Visit | Attending: Family Medicine | Admitting: Family Medicine

## 2022-12-29 ENCOUNTER — Other Ambulatory Visit: Payer: Self-pay | Admitting: Podiatry

## 2022-12-29 DIAGNOSIS — Z87891 Personal history of nicotine dependence: Secondary | ICD-10-CM | POA: Insufficient documentation

## 2022-12-29 DIAGNOSIS — F17210 Nicotine dependence, cigarettes, uncomplicated: Secondary | ICD-10-CM

## 2022-12-29 MED ORDER — TRAMADOL HCL 50 MG PO TABS: 50.0000 mg | ORAL_TABLET | Freq: Three times a day (TID) | ORAL | 0 refills | Status: DC | PRN

## 2022-12-29 MED ORDER — CEPHALEXIN 500 MG PO CAPS: 500.0000 mg | ORAL_CAPSULE | Freq: Three times a day (TID) | ORAL | 0 refills | Status: DC

## 2022-12-29 MED ORDER — ONDANSETRON HCL 4 MG PO TABS: 4.0000 mg | ORAL_TABLET | Freq: Three times a day (TID) | ORAL | 0 refills | Status: DC | PRN

## 2022-12-30 DIAGNOSIS — G5782 Other specified mononeuropathies of left lower limb: Secondary | ICD-10-CM | POA: Diagnosis not present

## 2022-12-30 DIAGNOSIS — G5762 Lesion of plantar nerve, left lower limb: Secondary | ICD-10-CM | POA: Diagnosis not present

## 2022-12-30 DIAGNOSIS — G8918 Other acute postprocedural pain: Secondary | ICD-10-CM | POA: Diagnosis not present

## 2023-01-02 ENCOUNTER — Other Ambulatory Visit: Payer: Self-pay | Admitting: Acute Care

## 2023-01-02 DIAGNOSIS — Z87891 Personal history of nicotine dependence: Secondary | ICD-10-CM

## 2023-01-02 DIAGNOSIS — Z122 Encounter for screening for malignant neoplasm of respiratory organs: Secondary | ICD-10-CM

## 2023-01-02 DIAGNOSIS — F17210 Nicotine dependence, cigarettes, uncomplicated: Secondary | ICD-10-CM

## 2023-01-03 ENCOUNTER — Other Ambulatory Visit: Payer: Self-pay | Admitting: Family Medicine

## 2023-01-03 NOTE — Telephone Encounter (Signed)
Requested Prescriptions  Pending Prescriptions Disp Refills   ezetimibe (ZETIA) 10 MG tablet [Pharmacy Med Name: EZETIMIBE '10MG'$  TABLETS] 90 tablet 0    Sig: TAKE 1 TABLET(10 MG) BY MOUTH DAILY     Cardiovascular:  Antilipid - Sterol Transport Inhibitors Failed - 01/03/2023 11:19 AM      Failed - Lipid Panel in normal range within the last 12 months    Cholesterol, Total  Date Value Ref Range Status  10/28/2022 161 100 - 199 mg/dL Final   LDL Chol Calc (NIH)  Date Value Ref Range Status  10/28/2022 103 (H) 0 - 99 mg/dL Final   HDL  Date Value Ref Range Status  10/28/2022 30 (L) >39 mg/dL Final   Triglycerides  Date Value Ref Range Status  10/28/2022 157 (H) 0 - 149 mg/dL Final         Passed - AST in normal range and within 360 days    AST  Date Value Ref Range Status  10/28/2022 16 0 - 40 IU/L Final         Passed - ALT in normal range and within 360 days    ALT  Date Value Ref Range Status  10/28/2022 20 0 - 44 IU/L Final         Passed - Patient is not pregnant      Passed - Valid encounter within last 12 months    Recent Outpatient Visits           2 months ago Annual physical exam   [place], Donald E, MD   5 months ago Lumbar radiculopathy   [place], Donald E, MD   1 year ago Annual physical exam   [place], Donald E, MD   2 years ago Right foot pain   [place], Donald E, MD   2 years ago Type 2 diabetes mellitus with other circulatory complication, without long-term current use of insulin [facility])   [place] [facility] C[facility] Section, Kirstie Peri, MD

## 2023-01-04 ENCOUNTER — Encounter: Payer: Self-pay | Admitting: Podiatry

## 2023-01-04 ENCOUNTER — Ambulatory Visit (INDEPENDENT_AMBULATORY_CARE_PROVIDER_SITE_OTHER): Payer: Medicare PPO | Admitting: Podiatry

## 2023-01-04 DIAGNOSIS — Z9889 Other specified postprocedural states: Secondary | ICD-10-CM

## 2023-01-04 DIAGNOSIS — G5782 Other specified mononeuropathies of left lower limb: Secondary | ICD-10-CM

## 2023-01-04 NOTE — Progress Notes (Signed)
He presents today for his first postop visit.  He is status post excision neuroma third interdigital space left foot.  States that is been a little tender Monday was the worst denies fever chills nausea vomit muscle aches pains calf pain back pain chest pain shortness of breath itching or rashes.  Objective: Sterile dressing intact utilizing cam walker once removed demonstrates no erythema to some mild edema no cellulitis drainage or odor sutures are intact margins well coapted.  Assessment: Well-healing surgical foot.  Plan: Redressed today dressed a compressive dressing follow-up with me in 1 week sutures may or may not come out next week.  Questions or concerns to be directed toward the office.

## 2023-01-18 ENCOUNTER — Ambulatory Visit (INDEPENDENT_AMBULATORY_CARE_PROVIDER_SITE_OTHER): Payer: Medicare PPO | Admitting: Podiatry

## 2023-01-18 DIAGNOSIS — G5782 Other specified mononeuropathies of left lower limb: Secondary | ICD-10-CM

## 2023-01-18 NOTE — Progress Notes (Signed)
  Subjective:  Patient ID: Thomas Morgan, male    DOB: Feb 23, 1951,  MRN: [id number]  Chief Complaint  Patient presents with   Routine Post Op    POV # 2 DOS --- EXCISION NEUROMA 3RD INTERDIGITAL SPACE LEFT FOOT -- HYATT PT     72 y.o. male returns for post-op check.  He is doing well not having much pain  Review of Systems: Negative except as noted in the HPI. Denies N/V/F/Ch.   Objective:  There were no vitals filed for this visit. There is no height or weight on file to calculate BMI. Constitutional Well developed. Well nourished.  Vascular Foot warm and well perfused. Capillary refill normal to all digits.  Calf is soft and supple, no posterior calf or knee pain, negative Homans' sign  Neurologic Normal speech. Oriented to person, place, and time. Epicritic sensation to light touch grossly present bilaterally.  Dermatologic Incision well-healed not hypertrophic  Orthopedic: He has little to no tenderness to palpation noted about the surgical site.    Assessment:   1. 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  S/p foot surgery left -Progressing as expected post-operatively.  Sutures removed uneventfully.  He may return to regular shoe gear.  Steri-Strips were applied.  He may begin his regular bathing again I advised him not to soak or scrub the incision but regular showering is okay and can apply Neosporin or Vaseline to the incision after.  No follow-ups on file.

## 2023-02-08 ENCOUNTER — Encounter: Payer: Self-pay | Admitting: Podiatry

## 2023-02-08 ENCOUNTER — Ambulatory Visit (INDEPENDENT_AMBULATORY_CARE_PROVIDER_SITE_OTHER): Payer: Medicare PPO | Admitting: Podiatry

## 2023-02-08 DIAGNOSIS — Z9889 Other specified postprocedural states: Secondary | ICD-10-CM

## 2023-02-08 DIAGNOSIS — G5782 Other specified mononeuropathies of left lower limb: Secondary | ICD-10-CM

## 2023-02-08 MED ORDER — GABAPENTIN 300 MG PO CAPS: ORAL_CAPSULE | ORAL | 3 refills | Status: DC

## 2023-02-08 NOTE — Progress Notes (Signed)
Khadeem presents today for third postop visit excision neuroma third interdigital space of the left foot states that he just got back from fishing trip and he was doing pretty good.  He states that he has noticed that he has worsening of his neuropathy in his left leg and foot.  Currently he is taking gabapentin 300 mg twice daily.  States that he is feels that he has to take his tramadol at night just to get rest.  Objective: Vital signs are stable alert oriented x 3 pulses are palpable.  Surgical site is gone on to heal uneventfully there is no erythema edema cellulitis drainage or odor he has good alignment of the toes.  Nontender on palpation.  Assessment: Is resolving neuroma excision third interdigital space left well-healing foot.  Worsening neuropathy left foot.  Plan: I increase his gabapentin from 300 mg in the morning and 300 mg at night to a new prescription of 300 mg in the morning and 600 mg at night.  He will try this if he feels that is too much she will notify us otherwise I will follow-up with him on an as-needed basis.

## 2023-02-26 DIAGNOSIS — H6691 Otitis media, unspecified, right ear: Secondary | ICD-10-CM | POA: Diagnosis not present

## 2023-03-15 DIAGNOSIS — H6123 Impacted cerumen, bilateral: Secondary | ICD-10-CM | POA: Diagnosis not present

## 2023-03-15 DIAGNOSIS — H6063 Unspecified chronic otitis externa, bilateral: Secondary | ICD-10-CM | POA: Diagnosis not present

## 2023-03-15 DIAGNOSIS — J301 Allergic rhinitis due to pollen: Secondary | ICD-10-CM | POA: Diagnosis not present

## 2023-03-31 ENCOUNTER — Other Ambulatory Visit: Payer: Self-pay | Admitting: Family Medicine

## 2023-06-21 ENCOUNTER — Ambulatory Visit (INDEPENDENT_AMBULATORY_CARE_PROVIDER_SITE_OTHER): Payer: Medicare PPO

## 2023-06-21 VITALS — Ht 70.0 in | Wt 187.0 lb

## 2023-06-21 DIAGNOSIS — Z0000 Encounter for general adult medical examination without abnormal findings: Secondary | ICD-10-CM

## 2023-06-21 NOTE — Patient Instructions (Signed)
Thomas Morgan , Thank you for taking time to come for your Medicare Wellness Visit. I appreciate your ongoing commitment to your health goals. Please review the following plan we discussed and let me know if I can assist you in the future.   Referrals/Orders/Follow-Ups/Clinician Recommendations: none  This is a list of the screening recommended for you and due dates:  Health Maintenance  Topic Date Due   Zoster (Shingles) Vaccine (1 of 2) Never done   DTaP/Tdap/Td vaccine (3 - Td or Tdap) 06/05/2017   COVID-19 Vaccine (3 - Pfizer risk series) 02/13/2020   Colon Cancer Screening  12/19/2021   Yearly kidney health urinalysis for diabetes  04/28/2022   Hemoglobin A1C  04/28/2023   Flu Shot  05/25/2023   Eye exam for diabetics  09/22/2023   Yearly kidney function blood test for diabetes  10/29/2023   Screening for Lung Cancer  12/29/2023   Medicare Annual Wellness Visit  06/20/2024   Pneumonia Vaccine  Completed   Hepatitis C Screening  Completed   HPV Vaccine  Aged Out    Advanced directives: (In Chart) A copy of your advanced directives are scanned into your chart should your provider ever need it.  Next Medicare Annual Wellness Visit scheduled for next year: Yes  06/26/24 @ 10:15am telephone

## 2023-06-21 NOTE — Progress Notes (Signed)
Subjective:   Thomas Morgan is a 72 y.o. male who presents for Medicare Annual/Subsequent preventive examination.  Visit Complete: Virtual  I connected with  [name] on 06/21/23 by a audio enabled telemedicine application and verified that I am speaking with the correct person using two identifiers.  Patient Location: Home  Provider Location: Office/Clinic  I discussed the limitations of evaluation and management by telemedicine. The patient expressed understanding and agreed to proceed.  Vital Signs: Unable to obtain new vitals due to this being a telehealth visit.  Patient Medicare AWV questionnaire was completed by the patient on 06/20/23; I have confirmed that all information answered by patient is correct and no changes since this date.  Review of Systems    Cardiac Risk Factors include: advanced age (>65men, >48 women);diabetes mellitus;dyslipidemia;hypertension;male gender;smoking/ tobacco exposure    Objective:    Today's Vitals   06/21/23 1214  Weight: 187 lb (84.8 kg)  Height: 5\' 10"  (1.778 m)   Body mass index is 26.83 kg/m.     06/21/2023   12:24 PM 06/18/2022    4:37 AM 05/03/2022    1:29 PM 04/21/2020    8:29 AM 04/02/2019    4:25 PM 04/02/2019    2:19 PM 12/22/2017    1:19 PM  Advanced Directives  Does Patient Have a Medical Advance Directive? Yes No Yes Yes Yes Yes Yes  Type of Estate agent of [place];Living will  Healthcare Power of [place];Living will Healthcare Power of [place];Living will Healthcare Power of [place];Living will Healthcare Power of [place];Living will Healthcare Power of [place];Living will  Does patient want to make changes to medical advance directive?   Yes (Inpatient - patient defers changing a medical advance directive and declines information at this time)      Copy of Healthcare Power of Attorney in Chart?   Yes - validated most recent copy scanned in chart (See row information) No - copy requested No -  copy requested No - copy requested No - copy requested  Would patient like information on creating a medical advance directive?    No - Patient declined       Current Medications (verified) Outpatient Encounter Medications as of 06/21/2023  Medication Sig   ezetimibe (ZETIA) 10 MG tablet TAKE 1 TABLET(10 MG) BY MOUTH DAILY   fluticasone (FLONASE) 50 MCG/ACT nasal spray Place 2 sprays into both nostrils daily.   gabapentin (NEURONTIN) 300 MG capsule Take one AM, two at bedtime   glucose blood (ONETOUCH VERIO) test strip TEST ONCE DAILY  Dx: E11.9   hydrochlorothiazide (HYDRODIURIL) 50 MG tablet TAKE 1 TABLET BY MOUTH EVERY DAY   lovastatin (MEVACOR) 40 MG tablet TAKE 1 TABLET BY MOUTH EVERY DAY   omeprazole (PRILOSEC) 40 MG capsule TAKE 1 CAPSULE(40 MG) BY MOUTH DAILY   ondansetron (ZOFRAN) 4 MG tablet Take 1 tablet (4 mg total) by mouth every 8 (eight) hours as needed.   OneTouch Delica Lancets 33G MISC Use to check blood sugar once a day.  DX: E11.9   benzonatate (TESSALON) 100 MG capsule Take 1 capsule (100 mg total) by mouth 2 (two) times daily as needed for cough. (Patient not taking: Reported on 06/21/2023)   cephALEXin (KEFLEX) 500 MG capsule Take 1 capsule (500 mg total) by mouth 3 (three) times daily. (Patient not taking: Reported on 06/21/2023)   cyclobenzaprine (FLEXERIL) 10 MG tablet Take 1 tablet (10 mg total) by mouth 3 (three) times daily as needed for muscle spasms. (Patient  not taking: Reported on 06/21/2023)   methocarbamol (ROBAXIN) 500 MG tablet Take 500 mg by mouth 4 (four) times daily as needed. (Patient not taking: Reported on 10/28/2022)   No facility-administered encounter medications on file as of 06/21/2023.    Allergies (verified) Hydrocodone, Atorvastatin, Crestor [rosuvastatin], and Lescol  [fluvastatin]   History: Past Medical History:  Diagnosis Date   Allergy Child   Arthritis 1970s   Basal cell carcinoma    Complication of anesthesia    GERD  (gastroesophageal reflux disease)    Neuromuscular disorder (HCC) 2023   Personal history of tobacco use, presenting hazards to health 10/22/2015   PONV (postoperative nausea and vomiting)    gets very sick from anesthesia   Spinal stenosis    Past Surgical History:  Procedure Laterality Date   BACK SURGERY     BASAL CELL CARCINOMA EXCISION Left 2020   left arm   CYST REMOVAL TRUNK  2020   on his back   ELBOW SURGERY     EXCISION MORTON'S NEUROMA Left 03/24/2017   Procedure: EXCISION MORTON'S NEUROMA;  Surgeon: Linus Galas, DPM;  Location: [facility] ORS;  Service: Podiatry;  Laterality: Left;   FOOT SURGERY Right 04/16/2021   FRACTURE SURGERY  1970s   Left elbow. Right wrist   SKIN CANCER EXCISION     SPINE SURGERY     WRIST SURGERY Right    Trauma resulting in prosthetics in the right wrist and elbow   Family History  Problem Relation Age of Onset   Cancer Mother        Lung   Hypertension Mother    Arthritis Mother    Heart Problems Father    AAA (abdominal aortic aneurysm) Father    Heart attack Brother    Hypercholesterolemia Brother    Social History   Socioeconomic History   Marital status: Married    Spouse name: Not on file   Number of children: 2   Years of education: Not on file   Highest education level: Bachelor's degree (e.g., BA, AB, BS)  Occupational History   Not on file  Tobacco Use   Smoking status: Every Day    Current packs/day: 1.00    Average packs/day: 1 pack/day for 48.0 years (48.0 ttl pk-yrs)    Types: Cigarettes   Smokeless tobacco: Never  Vaping Use   Vaping status: Never Used  Substance and Sexual Activity   Alcohol use: No   Drug use: No   Sexual activity: Not Currently    Birth control/protection: None  Other Topics Concern   Not on file  Social History Narrative   Not on file   Social Determinants of Health   Financial Resource Strain: Low Risk  (06/20/2023)   Overall Financial Resource Strain (CARDIA)    Difficulty of  Paying Living Expenses: Not hard at all  Food Insecurity: No Food Insecurity (06/20/2023)   Hunger Vital Sign    Worried About Running Out of Food in the Last Year: Never true    Ran Out of Food in the Last Year: Never true  Transportation Needs: No Transportation Needs (06/20/2023)   PRAPARE - Administrator, Civil Service (Medical): No    Lack of Transportation (Non-Medical): No  Physical Activity: Unknown (06/20/2023)   Exercise Vital Sign    Days of Exercise per Week: 0 days    Minutes of Exercise per Session: Not on file  Stress: No Stress Concern Present (06/20/2023)   [employer] of Occupational  Health - Occupational Stress Questionnaire    Feeling of Stress : Not at all  Social Connections: Unknown (06/20/2023)   Social Connection and Isolation Panel [NHANES]    Frequency of Communication with Friends and Family: More than three times a week    Frequency of Social Gatherings with Friends and Family: Twice a week    Attends Religious Services: Not on Marketing executive or Organizations: Yes    Attends Engineer, structural: More than 4 times per year    Marital Status: Married    Tobacco Counseling Ready to quit: No Counseling given: Not Answered   Clinical Intake:  Pre-visit preparation completed: Yes  Pain : No/denies pain     BMI - recorded: 26.83 Nutritional Status: BMI 25 -29 Overweight Nutritional Risks: None Diabetes: Yes CBG done?: Yes (BS 109 at home in am) CBG resulted in Enter/ Edit results?: No Did pt. bring in CBG monitor from home?: No  How often do you need to have someone help you when you read instructions, pamphlets, or other written materials from your doctor or pharmacy?: 1 - Never  Interpreter Needed?: No  Comments: lives with wife Information entered by :: B.Miata Culbreth,LPN   Activities of Daily Living    06/20/2023    7:53 PM 10/28/2022   10:40 AM  In your present state of health, do you have any  difficulty performing the following activities:  Hearing? 0 0  Vision? 0 0  Difficulty concentrating or making decisions? 0 0  Walking or climbing stairs? 1 1  Dressing or bathing? 0 0  Doing errands, shopping? 0 0  Preparing Food and eating ? N   Using the Toilet? N   In the past six months, have you accidently leaked urine? N   Do you have problems with loss of bowel control? N   Managing your Medications? N   Managing your Finances? N   Housekeeping or managing your Housekeeping? N     Patient Care Team: Malva Limes, MD as PCP - General (Family Medicine) Lonell Face, MD as Consulting Physician (Neurology) Maeola Harman, MD as Consulting Physician (Neurosurgery) Cephus Shelling, MD as Consulting Physician (Vascular Surgery)  Indicate any recent Medical Services you may have received from other than [facility] providers in the past year (date may be approximate).     Assessment:   This is a routine wellness examination for Eoin.  Hearing/Vision screen Hearing Screening - Comments:: Adequate hearing Vision Screening - Comments:: Adequate vision w/glasses;readers only [facility]  Dietary issues and exercise activities discussed:     Goals Addressed             This Visit's Progress    DIET - EAT MORE FRUITS AND VEGETABLES   On track    Exercise 150 min/wk Moderate Activity   On track    Recommend to continue exercising 3 days a week for 1 hour.      Quit Smoking   Not on track    Recommend to continue efforts to reduce smoking habits until no longer smoking.        Depression Screen    06/21/2023   12:21 PM 10/28/2022   10:40 AM 05/03/2022    1:27 PM 04/28/2021   10:07 AM 04/21/2020    8:26 AM 04/10/2020    9:57 AM 04/02/2019    4:26 PM  PHQ 2/9 Scores  PHQ - 2 Score 0 0 0 0 0 0  0  PHQ- 9 Score  3 0 2       Fall Risk    06/20/2023    7:53 PM 10/28/2022   10:40 AM 05/03/2022    1:29 PM 04/29/2022    8:43 AM 04/28/2021   10:07 AM  Fall  Risk   Falls in the past year? 0 0 0 0 0  Number falls in past yr:  0 0  0  Injury with Fall?  0 0  0  Risk for fall due to : No Fall Risks No Fall Risks No Fall Risks  No Fall Risks  Follow up Falls prevention discussed;Education provided Falls evaluation completed Falls evaluation completed  Falls evaluation completed    MEDICARE RISK AT HOME: Medicare Risk at Home Any stairs in or around the home?: No Home free of loose throw rugs in walkways, pet beds, electrical cords, etc?: Yes Adequate lighting in your home to reduce risk of falls?: Yes Life alert?: No Use of a cane, walker or w/c?: No Grab bars in the bathroom?: No Shower chair or bench in shower?: No Elevated toilet seat or a handicapped toilet?: No  TIMED UP AND GO:  Was the test performed?  No    Cognitive Function:        06/21/2023   12:24 PM 05/03/2022    1:33 PM 04/28/2021   10:05 AM 04/02/2019    4:30 PM 04/02/2019    2:24 PM  6CIT Screen  What Year? 0 points 0 points 0 points 0 points 0 points  What month? 0 points 0 points 0 points 0 points 0 points  What time? 0 points 0 points 0 points 0 points 0 points  Count back from 20 0 points 0 points 0 points 0 points 0 points  Months in reverse 0 points 0 points 0 points 0 points 0 points  Repeat phrase 0 points 0 points 2 points 2 points 2 points  Total Score 0 points 0 points 2 points 2 points 2 points    Immunizations Immunization History  Administered Date(s) Administered   Fluad Quad(high Dose 65+) 08/13/2019, 10/05/2020, 10/13/2021, 10/13/2021, 10/28/2022   Influenza, High Dose Seasonal PF 10/08/2015, 07/05/2016, 07/27/2018   Influenza-Unspecified 12/01/2017   PFIZER(Purple Top)SARS-COV-2 Vaccination 12/26/2019, 01/16/2020   Pneumococcal Conjugate-13 10/08/2015   Pneumococcal Polysaccharide-23 09/10/2008, 12/12/2016   Td 10/24/1997   Tdap 06/06/2007    TDAP status: Up to date  Flu Vaccine status: Up to date  Pneumococcal vaccine status: Up to  date  Covid-19 vaccine status: Completed vaccines  Qualifies for Shingles Vaccine? Yes   Zostavax completed No   Shingrix Completed?: No.    Education has been provided regarding the importance of this vaccine. Patient has been advised to call insurance company to determine out of pocket expense if they have not yet received this vaccine. Advised may also receive vaccine at local pharmacy or Health Dept. Verbalized acceptance and understanding.  Screening Tests Health Maintenance  Topic Date Due   Zoster Vaccines- Shingrix (1 of 2) Never done   DTaP/Tdap/Td (3 - Td or Tdap) 06/05/2017   COVID-19 Vaccine (3 - Pfizer risk series) 02/13/2020   Colonoscopy  12/19/2021   Diabetic kidney evaluation - Urine ACR  04/28/2022   HEMOGLOBIN A1C  04/28/2023   INFLUENZA VACCINE  05/25/2023   OPHTHALMOLOGY EXAM  09/22/2023   Diabetic kidney evaluation - eGFR measurement  10/29/2023   Lung Cancer Screening  12/29/2023   Medicare Annual Wellness (AWV)  06/20/2024  Pneumonia Vaccine 14+ Years old  Completed   Hepatitis C Screening  Completed   HPV VACCINES  Aged Out    Health Maintenance  Health Maintenance Due  Topic Date Due   Zoster Vaccines- Shingrix (1 of 2) Never done   DTaP/Tdap/Td (3 - Td or Tdap) 06/05/2017   COVID-19 Vaccine (3 - Pfizer risk series) 02/13/2020   Colonoscopy  12/19/2021   Diabetic kidney evaluation - Urine ACR  04/28/2022   HEMOGLOBIN A1C  04/28/2023   INFLUENZA VACCINE  05/25/2023    Colorectal cancer screening: Type of screening: Colonoscopy. Completed no. Repeat every 0 years pt does not want  Lung Cancer Screening: (Low Dose CT Chest recommended if Age 90-80 years, 20 pack-year currently smoking OR have quit w/in 15years.) does not qualify.   Lung Cancer Screening Referral: no 2025  Additional Screening:  Hepatitis C Screening: does not qualify; Completed yes  Vision Screening: Recommended annual ophthalmology exams for early detection of glaucoma and  other disorders of the eye. Is the patient up to date with their annual eye exam?  Yes  Who is the provider or what is the name of the office in which the patient attends annual eye exams? [facility] If pt is not established with a provider, would they like to be referred to a provider to establish care? No .   Dental Screening: Recommended annual dental exams for proper oral hygiene  Diabetic Foot Exam: Diabetic Foot Exam: Overdue, Pt has been advised about the importance in completing this exam. Pt is scheduled for diabetic foot exam on next PCP visit.  Community Resource Referral / Chronic Care Management: CRR required this visit?  No   CCM required this visit?  No     Plan:     I have personally reviewed and noted the following in the patient's chart:   Medical and social history Use of alcohol, tobacco or illicit drugs  Current medications and supplements including opioid prescriptions. Patient is not currently taking opioid prescriptions. Functional ability and status Nutritional status Physical activity Advanced directives List of other physicians Hospitalizations, surgeries, and ER visits in previous 12 months Vitals Screenings to include cognitive, depression, and falls Referrals and appointments  In addition, I have reviewed and discussed with patient certain preventive protocols, quality metrics, and best practice recommendations. A written personalized care plan for preventive services as well as general preventive health recommendations were provided to patient.    Sue Lush, LPN   4/40/1027   After Visit Summary: (MyChart) Due to this being a telephonic visit, the after visit summary with patients personalized plan was offered to patient via MyChart   Nurse Notes: The patient states he is doing well and has no concerns or questions at this time.

## 2023-07-05 ENCOUNTER — Other Ambulatory Visit: Payer: Self-pay | Admitting: Family Medicine

## 2023-07-26 ENCOUNTER — Encounter: Payer: Self-pay | Admitting: Podiatry

## 2023-07-26 ENCOUNTER — Ambulatory Visit: Payer: Medicare PPO | Admitting: Podiatry

## 2023-07-26 ENCOUNTER — Other Ambulatory Visit: Payer: Self-pay | Admitting: Family Medicine

## 2023-07-26 DIAGNOSIS — I10 Essential (primary) hypertension: Secondary | ICD-10-CM

## 2023-07-26 DIAGNOSIS — M792 Neuralgia and neuritis, unspecified: Secondary | ICD-10-CM

## 2023-07-26 DIAGNOSIS — G5782 Other specified mononeuropathies of left lower limb: Secondary | ICD-10-CM

## 2023-07-26 MED ORDER — GABAPENTIN 300 MG PO CAPS: ORAL_CAPSULE | ORAL | 3 refills | Status: DC

## 2023-07-26 NOTE — Progress Notes (Signed)
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  We took that out several months ago and he is gone on to heal very nicely however he was on a fishing trip and his pain suddenly worsened in the third interdigital space of his left foot.  Has become much more painful and is actually having to take 300 mg of gabapentin in the morning at lunchtime and 800 at nighttime with another 800 mg of ibuprofen at night.  He states that that is the only way that he can go to sleep.  He feels that is just worsening in that left foot.  Does relate a history of neuropathy.  Objective: Vital signs are stable he is alert and oriented x 3.  Pulses are palpable.  He has pain on palpation to the third interdigital space but no palpable Mulder's click.  No palpable neuroma.  He very well could be developing a stump neuroma but I question whether or not he is having deferred pain from his back because of the lack of severe pain on palpation to the third interdigital space.  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  Neuropathy bilateral left greater than right.  History of back surgery.  Plan: Offered him an injection to the third interspace he declined.  I did rewrite his gabapentin for 300 mg in the morning lunch and 800 mg at dinner.  I will follow-up with him in 1 to 2 months.  I am referring him for evaluation treatment to neurology.

## 2023-07-27 NOTE — Telephone Encounter (Signed)
Requested medication (s) are due for refill today: yes  Requested medication (s) are on the active medication list: yes  Last refill:  07/26/22 #90 4 refills  Future visit scheduled: no  Notes to clinic:  protocol failed. Last labs 10/28/22. Do you want to refill / renew Rx?     Requested Prescriptions  Pending Prescriptions Disp Refills   hydrochlorothiazide (HYDRODIURIL) 50 MG tablet [Pharmacy Med Name: HYDROCHLOROTHIAZIDE 50MG  TABLETS] 90 tablet 4    Sig: TAKE 1 TABLET BY MOUTH EVERY DAY     Cardiovascular: Diuretics - Thiazide Failed - 07/26/2023  5:40 PM      Failed - Cr in normal range and within 180 days    Creatinine, Ser  Date Value Ref Range Status  10/28/2022 0.71 (L) 0.76 - 1.27 mg/dL Final         Failed - K in normal range and within 180 days    Potassium  Date Value Ref Range Status  10/28/2022 3.7 3.5 - 5.2 mmol/L Final         Failed - Na in normal range and within 180 days    Sodium  Date Value Ref Range Status  10/28/2022 143 134 - 144 mmol/L Final         Failed - Last BP in normal range    BP Readings from Last 1 Encounters:  10/28/22 (!) 140/85         Passed - Valid encounter within last 6 months    Recent Outpatient Visits           9 months ago Annual physical exam   [facility] Malva Limes, MD   1 year ago Lumbar radiculopathy   [place] [facility] Malva Limes, MD   2 years ago Annual physical exam   [facility] Malva Limes, MD   2 years ago Right foot pain   [place] [facility] Malva Limes, MD   2 years ago Type 2 diabetes mellitus with other circulatory complication, without long-term current use of insulin [facility])    [facility] Sherrie Mustache, Demetrios Isaacs, MD

## 2023-08-12 ENCOUNTER — Other Ambulatory Visit: Payer: Self-pay | Admitting: Family Medicine

## 2023-08-12 DIAGNOSIS — E785 Hyperlipidemia, unspecified: Secondary | ICD-10-CM

## 2023-08-14 NOTE — Telephone Encounter (Signed)
Requested Prescriptions  Pending Prescriptions Disp Refills   lovastatin (MEVACOR) 40 MG tablet [Pharmacy Med Name: LOVASTATIN 40MG  TABLETS] 90 tablet 0    Sig: TAKE 1 TABLET BY MOUTH EVERY DAY     Cardiovascular:  Antilipid - Statins 2 Failed - 08/12/2023 10:21 AM      Failed - Cr in normal range and within 360 days    Creatinine, Ser  Date Value Ref Range Status  10/28/2022 0.71 (L) 0.76 - 1.27 mg/dL Final         Failed - Lipid Panel in normal range within the last 12 months    Cholesterol, Total  Date Value Ref Range Status  10/28/2022 161 100 - 199 mg/dL Final   LDL Chol Calc (NIH)  Date Value Ref Range Status  10/28/2022 103 (H) 0 - 99 mg/dL Final   HDL  Date Value Ref Range Status  10/28/2022 30 (L) >39 mg/dL Final   Triglycerides  Date Value Ref Range Status  10/28/2022 157 (H) 0 - 149 mg/dL Final         Passed - Patient is not pregnant      Passed - Valid encounter within last 12 months    Recent Outpatient Visits           9 months ago Annual physical exam   [facility] Malva Limes, MD   1 year ago Lumbar radiculopathy   [facility] [facility] Malva Limes, MD   2 years ago Annual physical exam   [facility] Malva Limes, MD   2 years ago Right foot pain   [place] [facility] Malva Limes, MD   2 years ago Type 2 diabetes mellitus with other circulatory complication, without long-term current use of insulin [facility])   [place] [facility] Sherrie Mustache, Demetrios Isaacs, MD

## 2023-08-18 ENCOUNTER — Other Ambulatory Visit: Payer: Self-pay

## 2023-08-18 ENCOUNTER — Telehealth: Payer: Self-pay | Admitting: Family Medicine

## 2023-08-18 DIAGNOSIS — E785 Hyperlipidemia, unspecified: Secondary | ICD-10-CM

## 2023-08-18 MED ORDER — LOVASTATIN 40 MG PO TABS: 40.0000 mg | ORAL_TABLET | Freq: Every day | ORAL | 0 refills | Status: DC

## 2023-08-18 NOTE — Telephone Encounter (Signed)
Resend medication and scheduled patient for follow-up appointment .

## 2023-08-18 NOTE — Telephone Encounter (Signed)
Pt called states was told by pharmacy that med,   lovastatin (MEVACOR) 40 MG    is being denied, but doesn't know why. I tried to reach pharmacy but they couldnt gie me answer right away. Pt will try to call them back

## 2023-08-18 NOTE — Telephone Encounter (Signed)
Lovastatin resend to the pharmacy. Patient was past due for office visit. Scheduled him for 10/28 for DM follow-up

## 2023-08-21 ENCOUNTER — Ambulatory Visit: Payer: Medicare PPO | Admitting: Family Medicine

## 2023-08-21 ENCOUNTER — Encounter: Payer: Self-pay | Admitting: Family Medicine

## 2023-08-21 VITALS — BP 127/74 | HR 70 | Ht 70.0 in | Wt 193.0 lb

## 2023-08-21 DIAGNOSIS — I700 Atherosclerosis of aorta: Secondary | ICD-10-CM

## 2023-08-21 DIAGNOSIS — Z23 Encounter for immunization: Secondary | ICD-10-CM | POA: Diagnosis not present

## 2023-08-21 DIAGNOSIS — R200 Anesthesia of skin: Secondary | ICD-10-CM | POA: Diagnosis not present

## 2023-08-21 DIAGNOSIS — E1159 Type 2 diabetes mellitus with other circulatory complications: Secondary | ICD-10-CM

## 2023-08-21 DIAGNOSIS — E785 Hyperlipidemia, unspecified: Secondary | ICD-10-CM

## 2023-08-21 DIAGNOSIS — E559 Vitamin D deficiency, unspecified: Secondary | ICD-10-CM | POA: Diagnosis not present

## 2023-08-21 DIAGNOSIS — I10 Essential (primary) hypertension: Secondary | ICD-10-CM

## 2023-08-21 DIAGNOSIS — R202 Paresthesia of skin: Secondary | ICD-10-CM

## 2023-08-21 LAB — POCT GLYCOSYLATED HEMOGLOBIN (HGB A1C): Hemoglobin A1C: 6.8 % — AB (ref 4.0–5.6)

## 2023-08-21 MED ORDER — TRAMADOL HCL 50 MG PO TABS: 50.0000 mg | ORAL_TABLET | Freq: Every evening | ORAL | 5 refills | Status: DC | PRN

## 2023-08-21 NOTE — Patient Instructions (Signed)
.   Please review the attached list of medications and notify my office if there are any errors.   . Please bring all of your medications to every appointment so we can make sure that our medication list is the same as yours.    Try OTC alpha-lipoic acid 600 three a day to see if it helps with neuropathy. If it's not helping after a month you can stop taking it.

## 2023-10-02 DIAGNOSIS — E119 Type 2 diabetes mellitus without complications: Secondary | ICD-10-CM | POA: Diagnosis not present

## 2023-10-02 DIAGNOSIS — H43811 Vitreous degeneration, right eye: Secondary | ICD-10-CM | POA: Diagnosis not present

## 2023-10-02 DIAGNOSIS — H2513 Age-related nuclear cataract, bilateral: Secondary | ICD-10-CM | POA: Diagnosis not present

## 2023-10-02 DIAGNOSIS — Z0100 Encounter for examination of eyes and vision without abnormal findings: Secondary | ICD-10-CM | POA: Diagnosis not present

## 2023-10-02 LAB — HM DIABETES EYE EXAM

## 2023-10-03 ENCOUNTER — Other Ambulatory Visit: Payer: Self-pay | Admitting: Family Medicine

## 2023-10-11 ENCOUNTER — Telehealth: Payer: Self-pay | Admitting: Family Medicine

## 2023-10-12 ENCOUNTER — Encounter: Payer: Self-pay | Admitting: Family Medicine

## 2023-10-16 ENCOUNTER — Other Ambulatory Visit: Payer: Self-pay | Admitting: Family Medicine

## 2023-10-16 DIAGNOSIS — I10 Essential (primary) hypertension: Secondary | ICD-10-CM

## 2023-10-22 ENCOUNTER — Telehealth: Payer: Medicare PPO

## 2023-10-22 DIAGNOSIS — J209 Acute bronchitis, unspecified: Secondary | ICD-10-CM

## 2023-10-22 MED ORDER — PREDNISONE 10 MG (21) PO TBPK: ORAL_TABLET | ORAL | 0 refills | Status: DC

## 2023-10-22 MED ORDER — BENZONATATE 100 MG PO CAPS: 100.0000 mg | ORAL_CAPSULE | Freq: Three times a day (TID) | ORAL | 0 refills | Status: DC | PRN

## 2023-10-22 NOTE — Progress Notes (Signed)
E-Visit for Cough  We are sorry that you are not feeling well.  Here is how we plan to help!  Based on your presentation I believe you most likely have A cough due to a virus.  This is called viral bronchitis and is best treated by rest, plenty of fluids and control of the cough.  You may use Ibuprofen or Tylenol as directed to help your symptoms.     In addition you may use A non-prescription cough medication called Robitussin DAC. Take 2 teaspoons every 8 hours or Delsym: take 2 teaspoons every 12 hours., A non-prescription cough medication called Mucinex DM: take 2 tablets every 12 hours., and A prescription cough medication called Tessalon Perles 100mg. You may take 1-2 capsules every 8 hours as needed for your cough.  Prednisone 10 mg daily for 6 days (see taper instructions below)  Directions for 6 day taper: Day 1: 2 tablets before breakfast, 1 after both lunch & dinner and 2 at bedtime Day 2: 1 tab before breakfast, 1 after both lunch & dinner and 2 at bedtime Day 3: 1 tab at each meal & 1 at bedtime Day 4: 1 tab at breakfast, 1 at lunch, 1 at bedtime Day 5: 1 tab at breakfast & 1 tab at bedtime Day 6: 1 tab at breakfast  From your responses in the eVisit questionnaire you describe inflammation in the upper respiratory tract which is causing a significant cough.  This is commonly called Bronchitis and has four common causes:   Allergies Viral Infections Acid Reflux Bacterial Infection Allergies, viruses and acid reflux are treated by controlling symptoms or eliminating the cause. An example might be a cough caused by taking certain blood pressure medications. You stop the cough by changing the medication. Another example might be a cough caused by acid reflux. Controlling the reflux helps control the cough.  USE OF BRONCHODILATOR ("RESCUE") INHALERS: There is a risk from using your bronchodilator too frequently.  The risk is that over-reliance on a medication which only relaxes the  muscles surrounding the breathing tubes can reduce the effectiveness of medications prescribed to reduce swelling and congestion of the tubes themselves.  Although you feel brief relief from the bronchodilator inhaler, your asthma may actually be worsening with the tubes becoming more swollen and filled with mucus.  This can delay other crucial treatments, such as oral steroid medications. If you need to use a bronchodilator inhaler daily, several times per day, you should discuss this with your provider.  There are probably better treatments that could be used to keep your asthma under control.     HOME CARE Only take medications as instructed by your medical team. Complete the entire course of an antibiotic. Drink plenty of fluids and get plenty of rest. Avoid close contacts especially the very young and the elderly Cover your mouth if you cough or cough into your sleeve. Always remember to wash your hands A steam or ultrasonic humidifier can help congestion.   GET HELP RIGHT AWAY IF: You develop worsening fever. You become short of breath You cough up blood. Your symptoms persist after you have completed your treatment plan MAKE SURE YOU  Understand these instructions. Will watch your condition. Will get help right away if you are not doing well or get worse.    Thank you for choosing an e-visit.  Your e-visit answers were reviewed by a board certified advanced clinical practitioner to complete your personal care plan. Depending upon the condition, your plan   could have included both over the counter or prescription medications.  Please review your pharmacy choice. Make sure the pharmacy is open so you can pick up prescription now. If there is a problem, you may contact your provider through MyChart messaging and have the prescription routed to another pharmacy.  Your safety is important to us. If you have drug allergies check your prescription carefully.   For the next 24 hours you can  use MyChart to ask questions about today's visit, request a non-urgent call back, or ask for a work or school excuse. You will get an email in the next two days asking about your experience. I hope that your e-visit has been valuable and will speed your recovery.  Approximately 5 minutes was spent documenting and reviewing patient's chart.   

## 2023-11-11 ENCOUNTER — Other Ambulatory Visit: Payer: Self-pay | Admitting: Family Medicine

## 2023-11-11 DIAGNOSIS — E785 Hyperlipidemia, unspecified: Secondary | ICD-10-CM

## 2023-11-14 ENCOUNTER — Ambulatory Visit: Payer: Medicare PPO | Admitting: Neurology

## 2023-11-14 ENCOUNTER — Encounter: Payer: Self-pay | Admitting: Neurology

## 2023-11-14 VITALS — BP 126/72 | HR 78 | Ht 70.0 in | Wt 196.0 lb

## 2023-11-14 DIAGNOSIS — R202 Paresthesia of skin: Secondary | ICD-10-CM | POA: Diagnosis not present

## 2023-11-14 DIAGNOSIS — R269 Unspecified abnormalities of gait and mobility: Secondary | ICD-10-CM | POA: Insufficient documentation

## 2023-11-14 MED ORDER — DULOXETINE HCL 60 MG PO CPEP: 60.0000 mg | ORAL_CAPSULE | Freq: Every day | ORAL | 6 refills | Status: DC

## 2023-11-14 MED ORDER — DULOXETINE HCL 30 MG PO CPEP: 30.0000 mg | ORAL_CAPSULE | Freq: Every day | ORAL | 0 refills | Status: DC

## 2023-11-14 NOTE — Progress Notes (Signed)
Chief Complaint  Patient presents with   New Patient (Initial Visit)    Pt in 14, here alone Pt is here for neuropathic pain. Pt states after having a laminectomy in 05/2022, on his back is when he started with this foot pain. Pt states he has numbing, tingling and needle sensation of both feet.       ASSESSMENT AND PLAN  [name] is a 73 y.o. male   Subacute onset of bilateral lower extremity paresthesia, gait abnormality shortly after his lumbar decompression surgery in summer 2023  On examination, length-dependent sensory changes, areflexia, shiny distal leg skin, mildly unsteady gait with positive Romberg signs,  Consistent with peripheral neuropathy  EMG nerve conduction study  Add on Cymbalta 30 mg titrating to 60 mg along with gabapentin for his neuropathic pain,  DIAGNOSTIC DATA (LABS, IMAGING, TESTING) - I reviewed patient records, labs, notes, testing and imaging myself where available.   MEDICAL HISTORY:  Thomas Morgan, is a 73 year old male, seen in request by his podiatrist Dr. Al Corpus, Max for evaluation of bilateral burning feet, his primary care is from [facility] doctor Malva Limes initial evaluation was on November 14, 2023  History is obtained from the patient and review of electronic medical records. I personally reviewed pertinent available imaging films in PACS.   PMHx of  HLD DM HTN GERD  He had long history of chronic low back pain, getting worse since 2022, tried few epidural injection without effectively control his symptoms, eventually underwent lumbar decompression surgery by Dr. Whitney Muse  on June 09 2022 at outpatient surgical center.  Initially he recovered very well, but presented to emergency room on June 18, 2022 a week later, he was sitting in his bed, twisted then he had acute onset of severe low back pain radiating to right buttock, down to right lower extremity, the pain was so severe, he was not able to bear  weight,  CT lumbar reviewed no acute findings, required aggressive pain management consistent with IV analgesia, fentanyl, Toradol, oxycodone, muscle relaxant, gabapentin, steroid,   Postsurgical MRI August 2023, postsurgical changes, mild crowding of coronary creatinine, moderate to severe left-sided neuroforaminal stenosis L4-5,  MRI of right hip, moderate bilateral femoral acetabular cartilage degeneration, mild right common hamstring origin tendinosis, tiny partial-thickness tears of right gluteus minimus, and anterior right gluteus medius tendon insertion, moderate right and mild left groin muscle strain, he even received right hip injection by IR, which did help his pain,  But the source of his pain was never clearly identified, he also noticed left fifth finger numbness since then  While he was discharged home in September 2024, that is when he began to noticed bilateral toes burning numbness, tingling, which has been persistent since then, in addition, he noticed mild unsteady gait, which slightly worsened over the past year   Chart reviewed, he was under the care of podiatrist Dr.Hyatt since 2022 Complains of toes paresthesia, unpleasant painful sensation  He is not taking gabapentin 300 mg 1/1/3 tablets at night, occasionally tramadol because bilateral feet burning pain, urged to move his leg, difficulty falling to sleep,  Lab in 2024: A1c was 6.6, up to 7.2, but never received diabetic treatment, normal TSH, CBC hemoglobin of 14.7, CMP,  Presurgical MRI of lumbar spine May 2023, multilevel degenerative changes, L3-4 with advanced spinal stenosis,    MRI of lumbar August 02, 2022, and large posterior paraspinal fluid collection at L3-4, with associated peripheral enhancement, new periarticular enhancement  associated with right side facet joint at L3-4, chronic moderate left to severe left greater than right foraminal narrowing L5-S1, 4.1 cm abdominal aortic aneurysm    PHYSICAL  EXAM:   Vitals:   11/14/23 1359  BP: 126/72  Pulse: 78  Weight: 196 lb (88.9 kg)  Height: 5\' 10"  (1.778 m)   Not recorded     Body mass index is 28.12 kg/m.  PHYSICAL EXAMNIATION:  Gen: NAD, conversant, well nourised, well groomed                     Cardiovascular: Regular rate rhythm, no peripheral edema, warm, nontender. Eyes: Conjunctivae clear without exudates or hemorrhage Neck: Supple, no carotid bruits. Pulmonary: Clear to auscultation bilaterally   NEUROLOGICAL EXAM:  MENTAL STATUS: Speech/cognition: Awake, alert, oriented to history taking and casual conversation CRANIAL NERVES: CN II: Visual fields are full to confrontation. Pupils are round equal and briskly reactive to light. CN III, IV, VI: extraocular movement are normal. No ptosis. CN V: Facial sensation is intact to light touch CN VII: Face is symmetric with normal eye closure  CN VIII: Hearing is normal to causal conversation. CN IX, X: Phonation is normal. CN XI: Head turning and shoulder shrug are intact  MOTOR: Shiny distal leg skin, hair light receding to distal shin level, mild left toe extension flexion weakness,  REFLEXES: Areflexia  SENSORY: Length-dependent decreased light touch pinprick, vibratory sensation, preserved toe proprioception  COORDINATION: There is no trunk or limb dysmetria noted.  GAIT/STANCE: Push-up to get up from seated position, wide-based gait, toes pointing outwards, unsteady, difficulty walking on tiptoe heels, positive Romberg signs,  REVIEW OF SYSTEMS:  Full 14 system review of systems performed and notable only for as above All other review of systems were negative.   ALLERGIES: Allergies  Allergen Reactions   Hydrocodone Other (See Comments)    Palpitations, increased heart rate   Atorvastatin     Aching and feeling down   Crestor [Rosuvastatin] Other (See Comments)    Muscle aches   Lescol  [Fluvastatin]     Muscle Aches    HOME  MEDICATIONS: Current Outpatient Medications  Medication Sig Dispense Refill   ezetimibe (ZETIA) 10 MG tablet TAKE 1 TABLET(10 MG) BY MOUTH DAILY 90 tablet 0   gabapentin (NEURONTIN) 300 MG capsule Take 300mg  AM, 300mg  PM, 900mg  QHS 450 capsule 3   glucose blood (ONETOUCH VERIO) test strip TEST ONCE DAILY  Dx: E11.9 100 each 3   hydrochlorothiazide (HYDRODIURIL) 50 MG tablet TAKE 1 TABLET BY MOUTH EVERY DAY 90 tablet 0   lovastatin (MEVACOR) 40 MG tablet TAKE 1 TABLET(40 MG) BY MOUTH DAILY 90 tablet 4   omeprazole (PRILOSEC) 40 MG capsule TAKE 1 CAPSULE(40 MG) BY MOUTH DAILY 90 capsule 4   OneTouch Delica Lancets 33G MISC Use to check blood sugar once a day.  DX: E11.9 100 each 3   traMADol (ULTRAM) 50 MG tablet Take 1-2 tablets (50-100 mg total) by mouth at bedtime as needed. 60 tablet 5   benzonatate (TESSALON PERLES) 100 MG capsule Take 1 capsule (100 mg total) by mouth 3 (three) times daily as needed. (Patient not taking: Reported on 11/14/2023) 20 capsule 0   No current facility-administered medications for this visit.    PAST MEDICAL HISTORY: Past Medical History:  Diagnosis Date   Allergy Child   Arthritis 1970s   Basal cell carcinoma    Complication of anesthesia    GERD (gastroesophageal reflux disease)  Neuromuscular disorder (HCC) 2023   Personal history of tobacco use, presenting hazards to health 10/22/2015   PONV (postoperative nausea and vomiting)    gets very sick from anesthesia   Spinal stenosis     PAST SURGICAL HISTORY: Past Surgical History:  Procedure Laterality Date   BACK SURGERY     BASAL CELL CARCINOMA EXCISION Left 2020   left arm   CYST REMOVAL TRUNK  2020   on his back   ELBOW SURGERY     EXCISION MORTON'S NEUROMA Left 03/24/2017   Procedure: EXCISION MORTON'S NEUROMA;  Surgeon: Linus Galas, DPM;  Location: [facility] ORS;  Service: Podiatry;  Laterality: Left;   FOOT SURGERY Right 04/16/2021   FRACTURE SURGERY  1970s   Left elbow. Right wrist    SKIN CANCER EXCISION     SPINE SURGERY     WRIST SURGERY Right    Trauma resulting in prosthetics in the right wrist and elbow    FAMILY HISTORY: Family History  Problem Relation Age of Onset   Cancer Mother        Lung   Hypertension Mother    Arthritis Mother    Heart Problems Father    AAA (abdominal aortic aneurysm) Father    Heart attack Brother    Hypercholesterolemia Brother    Neuropathy Neg Hx     SOCIAL HISTORY: Social History   Socioeconomic History   Marital status: Married    Spouse name: Not on file   Number of children: 2   Years of education: Not on file   Highest education level: Associate degree: occupational, Scientist, product/process development, or vocational program  Occupational History   Not on file  Tobacco Use   Smoking status: Every Day    Current packs/day: 1.00    Average packs/day: 1 pack/day for 48.0 years (48.0 ttl pk-yrs)    Types: Cigarettes   Smokeless tobacco: Never   Tobacco comments:    1 pack a day,   Vaping Use   Vaping status: Never Used  Substance and Sexual Activity   Alcohol use: No   Drug use: No   Sexual activity: Not Currently    Birth control/protection: None  Other Topics Concern   Not on file  Social History Narrative   Not on file   Social Drivers of Health   Financial Resource Strain: Low Risk  (08/19/2023)   Overall Financial Resource Strain (CARDIA)    Difficulty of Paying Living Expenses: Not hard at all  Food Insecurity: No Food Insecurity (08/19/2023)   Hunger Vital Sign    Worried About Running Out of Food in the Last Year: Never true    Ran Out of Food in the Last Year: Never true  Transportation Needs: No Transportation Needs (08/19/2023)   PRAPARE - Administrator, Civil Service (Medical): No    Lack of Transportation (Non-Medical): No  Physical Activity: Unknown (08/19/2023)   Exercise Vital Sign    Days of Exercise per Week: Patient declined    Minutes of Exercise per Session: Not on file  Stress: No  Stress Concern Present (08/19/2023)   [employer] of Occupational Health - Occupational Stress Questionnaire    Feeling of Stress : Not at all  Social Connections: Socially Integrated (08/19/2023)   Social Connection and Isolation Panel [NHANES]    Frequency of Communication with Friends and Family: More than three times a week    Frequency of Social Gatherings with Friends and Family: Once a week  Attends Religious Services: More than 4 times per year    Active Member of Clubs or Organizations: Yes    Attends Banker Meetings: More than 4 times per year    Marital Status: Married  Catering manager Violence: Not At Risk (06/21/2023)   Humiliation, Afraid, Rape, and Kick questionnaire    Fear of Current or Ex-Partner: No    Emotionally Abused: No    Physically Abused: No    Sexually Abused: No      Levert Feinstein, M.D. Ph.D.  [facility] [address], [address] [place], [place] [address] Ph: [phone] Fax: [phone]  CC:  Elinor Parkinson, DPM [address] [place],  [place] [address]  Malva Limes, MD

## 2023-11-22 ENCOUNTER — Encounter: Payer: Medicare PPO | Admitting: Family Medicine

## 2023-11-28 ENCOUNTER — Ambulatory Visit: Payer: Medicare PPO | Attending: Family Medicine | Admitting: Physical Therapy

## 2023-11-28 ENCOUNTER — Encounter: Payer: Self-pay | Admitting: Family Medicine

## 2023-11-28 ENCOUNTER — Ambulatory Visit (INDEPENDENT_AMBULATORY_CARE_PROVIDER_SITE_OTHER): Payer: Medicare PPO | Admitting: Family Medicine

## 2023-11-28 VITALS — BP 135/81 | HR 64 | Resp 16 | Ht 70.0 in | Wt 194.1 lb

## 2023-11-28 DIAGNOSIS — I719 Aortic aneurysm of unspecified site, without rupture: Secondary | ICD-10-CM

## 2023-11-28 DIAGNOSIS — E1159 Type 2 diabetes mellitus with other circulatory complications: Secondary | ICD-10-CM

## 2023-11-28 DIAGNOSIS — R2689 Other abnormalities of gait and mobility: Secondary | ICD-10-CM | POA: Insufficient documentation

## 2023-11-28 DIAGNOSIS — Z125 Encounter for screening for malignant neoplasm of prostate: Secondary | ICD-10-CM

## 2023-11-28 DIAGNOSIS — E559 Vitamin D deficiency, unspecified: Secondary | ICD-10-CM | POA: Diagnosis not present

## 2023-11-28 DIAGNOSIS — R202 Paresthesia of skin: Secondary | ICD-10-CM | POA: Diagnosis not present

## 2023-11-28 DIAGNOSIS — R262 Difficulty in walking, not elsewhere classified: Secondary | ICD-10-CM | POA: Diagnosis not present

## 2023-11-28 DIAGNOSIS — Z0000 Encounter for general adult medical examination without abnormal findings: Secondary | ICD-10-CM

## 2023-11-28 DIAGNOSIS — R269 Unspecified abnormalities of gait and mobility: Secondary | ICD-10-CM | POA: Diagnosis not present

## 2023-11-28 DIAGNOSIS — M6281 Muscle weakness (generalized): Secondary | ICD-10-CM | POA: Diagnosis not present

## 2023-11-28 DIAGNOSIS — E785 Hyperlipidemia, unspecified: Secondary | ICD-10-CM

## 2023-11-28 DIAGNOSIS — R2681 Unsteadiness on feet: Secondary | ICD-10-CM | POA: Insufficient documentation

## 2023-11-28 DIAGNOSIS — Z0001 Encounter for general adult medical examination with abnormal findings: Secondary | ICD-10-CM

## 2023-11-28 DIAGNOSIS — I10 Essential (primary) hypertension: Secondary | ICD-10-CM | POA: Diagnosis not present

## 2023-11-28 NOTE — Patient Instructions (Signed)
   The CDC recommends two doses of Shingrix (the shingles vaccine) separated by 2 to 6 months for adults age 74 years and older. I recommend checking with your insurance plan regarding coverage for this vaccine.   . You are due for a Tdap (tetanus-diptheria-pertussis vaccine) which protects you from tetanus and whooping cough. Please check with your insurance plan or pharmacy regarding coverage for this vaccine.    

## 2023-11-28 NOTE — Therapy (Signed)
 OUTPATIENT PHYSICAL THERAPY NEURO EVALUATION   Patient Name: RAN TULLIS MRN: [id number] DOB:1951/10/11, 73 y.o., male Today's Date: 11/28/2023   PCP: Gasper Nancyann BRAVO, MD  REFERRING PROVIDER: Onita Duos, MD    END OF SESSION:  PT End of Session - 11/28/23 1044     Visit Number 1    Number of Visits 24    Date for PT Re-Evaluation 02/20/24    Progress Note Due on Visit 10    PT Start Time 1050    PT Stop Time 1130    PT Time Calculation (min) 40 min    Equipment Utilized During Treatment Gait belt    Activity Tolerance Patient tolerated treatment well    Behavior During Therapy WFL for tasks assessed/performed             Past Medical History:  Diagnosis Date   Allergy Child   Arthritis 1970s   Basal cell carcinoma    Complication of anesthesia    GERD (gastroesophageal reflux disease)    Neuromuscular disorder (HCC) 2023   Personal history of tobacco use, presenting hazards to health 10/22/2015   PONV (postoperative nausea and vomiting)    gets very sick from anesthesia   Spinal stenosis    Past Surgical History:  Procedure Laterality Date   BACK SURGERY     BASAL CELL CARCINOMA EXCISION Left 2020   left arm   CYST REMOVAL TRUNK  2020   on his back   ELBOW SURGERY     EXCISION MORTON'S NEUROMA Left 03/24/2017   Procedure: EXCISION MORTON'S NEUROMA;  Surgeon: Neill Boas, DPM;  Location: [facility] ORS;  Service: Podiatry;  Laterality: Left;   FOOT SURGERY Right 04/16/2021   FRACTURE SURGERY  1970s   Left elbow. Right wrist   SKIN CANCER EXCISION     SPINE SURGERY     WRIST SURGERY Right    Trauma resulting in prosthetics in the right wrist and elbow   Patient Active Problem List   Diagnosis Date Noted   Paresthesia 11/14/2023   Gait abnormality 11/14/2023   Lumbar radiculopathy 06/18/2022   Abdominal aortic aneurysm without rupture (HCC) 05/02/2022   Leg pain 01/12/2021   PAD (peripheral artery disease) (HCC) 01/12/2021   Emphysema lung (HCC)  11/23/2020   Vitamin D  deficiency 10/05/2020   Lumbar canal stenosis 11/27/2019   Aortic aneurysm (HCC) 03/17/2017   Coronary arteriosclerosis 12/12/2016   Atherosclerosis of aorta (HCC) 11/14/2016   Numbness and tingling of foot 10/08/2015   Allergic rhinitis 04/24/2015   Back pain, chronic 04/24/2015   Diabetes (HCC) 04/24/2015   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   GERD (gastroesophageal reflux disease) 04/24/2015   Hypertension 04/24/2015   Obesity 04/24/2015   History of adenomatous polyp of colon 04/24/2015   Vasomotor rhinitis 04/24/2015   Ventral hernia 05/25/2010   Hyperlipidemia 10/12/2009   Arthralgia of multiple joints 10/12/2009   Hypersomnia 08/22/2008   Psoriasis 08/22/2008   Hepatic steatosis 06/06/2007   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 Pt spent 16 days in the hospital on pain meds with no known diagnosis. Pt noticed tingling in his feet at the end of his hospital stay. Pt reports numbness and tingling in his feet is still present and at times he needs tramadol  to sleep. Pt reports occasional losses of balance such as this morning when he got out of the shower drying his hair ( limiting field of vision) . Pt reports  pain is in his right but cheek that is worsened by standing postures or prolonged sitting posture but not very debilitating.   Pt accompanied by: self  PERTINENT HISTORY: Subacute onset of bilateral lower extremity paresthesia, gait abnormality shortly after his lumbar decompression surgery in summer 2023  Underwent lumbar decompression surgery by Dr. Verona  on June 09 2022 at outpatient surgical center.   PAIN:  Are you having pain? Yes: NPRS scale: 1 Pain location: right glute Pain description: achy Aggravating factors: standing for 15 or more minutes   Relieving factors: tramadol   PRECAUTIONS: None  RED FLAGS: None   WEIGHT BEARING RESTRICTIONS: No  FALLS: Has patient fallen in last 6 months? No  LIVING ENVIRONMENT: Lives with: lives with their spouse Lives in: House/apartment Stairs: Yes: External: 3 steps; on right going up Has following equipment at home: None  PLOF: Independent  PATIENT GOALS: Improve balance, work on neuropathy  OBJECTIVE:  Note: Objective measures were completed at Evaluation unless otherwise noted.  DIAGNOSTIC FINDINGS: n/a  COGNITION: Overall cognitive status: Within functional limits for tasks assessed   SENSATION: Not tested  COORDINATION: NT   POSTURE: No Significant postural limitations  LOWER EXTREMITY ROM:   WNL  LOWER EXTREMITY MMT:    MMT Right Eval Left Eval  Hip flexion 4+ 4+  Hip extension    Hip abduction 4+ 4+  Hip adduction 4+ 4+  Hip internal rotation    Hip external rotation    Knee flexion 5 5  Knee extension 5 5  Ankle dorsiflexion 4 4  Ankle plantarflexion * *  *Unable to complete full heel raise without support indicating ankle strength impairment         (Blank rows = not tested)  BED MOBILITY:  No problems per report   TRANSFERS: Assistive device utilized: None  Sit to stand: Complete Independence Stand to sit: Complete Independence Chair to chair: Complete Independence Floor:  Modified independence  RAMP:  Level of Assistance: Complete Independence Assistive device utilized: None Ramp Comments: per report  CURB:  Level of Assistance: Complete Independence Assistive device utilized: None Curb Comments: per report  STAIRS: Level of Assistance: Complete Independence Stair Negotiation Technique: Alternating Pattern  with No Rails Number of Stairs: 4  Height of Stairs: 6in    GAIT:  [facility] PT Assessment - 11/28/23 0001       Standardized Balance Assessment   Standardized Balance Assessment Dynamic Gait Index      Dynamic Gait Index   Level Surface Normal   11.13   Change in Gait Speed Normal    Gait with Horizontal Head Turns Mild Impairment    Gait with Vertical Head Turns Mild Impairment    Gait and Pivot Turn Normal    Step Over Obstacle Mild Impairment    Step Around Obstacles Normal    Steps Normal    Total Score 21  FUNCTIONAL TESTS:  10 meter walk test: .84 m/s 30 second chair stand  11 reps, arms across chest  DGI: 21 MTCSIB: normal ( 30 sec) with all conditions  Mini BEST: test visit 2  PATIENT SURVEYS:  ABC scale 81 % LEFS 50/80                                                                                                                              TREATMENT DATE:  Educated pt regarding prognosis, POC, treatment options and expectations with PT treatments Instructed for heel raises for initial HEP exercises but no handout provided      PATIENT EDUCATION: Education details: POC Person educated: Patient Education method: Explanation Education comprehension: verbalized understanding   HOME EXERCISE PROGRAM: Establish visit 2    GOALS: Goals reviewed with patient? Yes  SHORT TERM GOALS: Target date: 12/26/2023       Patient will be independent in home exercise program to improve strength/mobility for better functional independence with ADLs. Baseline: No HEP currently  Goal status:  INITIAL   LONG TERM GOALS: Target date: 01/23/2024    1.  Patient will improve 30 sec chair stand test to 14 reps or greater indicating an increased LE strength and improved balance. Baseline: 11 Goal status: INITIAL  2.  Patient will improve LEFS score to equal to or greater than  60   to demonstrate statistically significant improvement in mobility and quality of life.  Baseline: 50 Goal status: INITIAL   3.  Patient will increase DGI balance score by points to 24 points to demonstrate decreased fall risk during functional activities. Baseline: 21 Goal status: INITIAL    4.   Patient will increase 10 meter walk test to >1.107m/s as to improve gait speed for better community ambulation and to reduce fall risk. Baseline: .83 m/s  Goal status: INITIAL  5.   Patient will increase mini best score by 4 points or greater to demonstrate improved balance and safety with everyday tasks  Baseline: Test session 2  Goal status: INITIAL    ASSESSMENT:  CLINICAL IMPRESSION: Patient is a 73 y.o. Male who was seen today for physical therapy evaluation and treatment for balance impairments as a result of neuropathy.  Pt demonstrates impaired balance as a result of his altered sensation in his feet bilaterally. Pt has some deficits with DGI, 30 sec chair stand and , but more sensitive balance assessment needed next session to find further impairments via mini BEST test. Pt will benefit from skilled PT interventions to improve his balance to improve his confidence with activities and decrease his risk for falls. Pt will likely only need short bout of PT, starting with 2 x per week for 2 weeks then 1 x per week for the remainder of needed visits of 6 week period. Pt requested this due to financial concerns of PT cost per visit.    OBJECTIVE IMPAIRMENTS: Abnormal gait, decreased activity tolerance, decreased balance,  and difficulty walking.   ACTIVITY LIMITATIONS: standing and locomotion  level  PARTICIPATION LIMITATIONS: community activity and yard work  PERSONAL FACTORS: Age and 3+ comorbidities: HTN, HLD, PAD, DM  are also affecting patient's functional outcome.   REHAB POTENTIAL: Good  CLINICAL DECISION MAKING: Evolving/moderate complexity  EVALUATION COMPLEXITY: Moderate  PLAN:  PT FREQUENCY: 1-2x/week  PT DURATION: 6 weeks  PLANNED INTERVENTIONS: 97110-Therapeutic exercises, 97530- Therapeutic activity, 97112- Neuromuscular re-education, 97535- Self Care, [id number]- Manual therapy, [id number]- Gait training, Patient/Family education, and Balance training  PLAN FOR NEXT SESSION: Mini BEST, HEP for high level balance Pt will likely only need short bout of PT, starting with 2 x per week for 2 weeks then 1 x per week for the remainder of needed visits of 6 week period. Pt requested this due to financial concerns of PT cost per visit.     Lonni KATHEE Gainer, PT 11/28/2023, 10:45 AM

## 2023-11-29 DIAGNOSIS — D2262 Melanocytic nevi of left upper limb, including shoulder: Secondary | ICD-10-CM | POA: Diagnosis not present

## 2023-11-29 DIAGNOSIS — L821 Other seborrheic keratosis: Secondary | ICD-10-CM | POA: Diagnosis not present

## 2023-11-29 DIAGNOSIS — D2261 Melanocytic nevi of right upper limb, including shoulder: Secondary | ICD-10-CM | POA: Diagnosis not present

## 2023-11-29 DIAGNOSIS — L570 Actinic keratosis: Secondary | ICD-10-CM | POA: Diagnosis not present

## 2023-11-29 DIAGNOSIS — D2272 Melanocytic nevi of left lower limb, including hip: Secondary | ICD-10-CM | POA: Diagnosis not present

## 2023-11-29 DIAGNOSIS — Z85828 Personal history of other malignant neoplasm of skin: Secondary | ICD-10-CM | POA: Diagnosis not present

## 2023-11-29 DIAGNOSIS — D225 Melanocytic nevi of trunk: Secondary | ICD-10-CM | POA: Diagnosis not present

## 2023-11-29 DIAGNOSIS — D2271 Melanocytic nevi of right lower limb, including hip: Secondary | ICD-10-CM | POA: Diagnosis not present

## 2023-11-29 DIAGNOSIS — Z08 Encounter for follow-up examination after completed treatment for malignant neoplasm: Secondary | ICD-10-CM | POA: Diagnosis not present

## 2023-11-30 ENCOUNTER — Encounter: Payer: Self-pay | Admitting: Family Medicine

## 2023-11-30 ENCOUNTER — Telehealth: Payer: Self-pay | Admitting: Family Medicine

## 2023-11-30 ENCOUNTER — Other Ambulatory Visit: Payer: Self-pay | Admitting: Family Medicine

## 2023-11-30 DIAGNOSIS — E785 Hyperlipidemia, unspecified: Secondary | ICD-10-CM

## 2023-11-30 LAB — COMPREHENSIVE METABOLIC PANEL
ALT: 17 [IU]/L (ref 0–44)
AST: 18 [IU]/L (ref 0–40)
Albumin: 4.1 g/dL (ref 3.8–4.8)
Alkaline Phosphatase: 84 [IU]/L (ref 44–121)
BUN/Creatinine Ratio: 20 (ref 10–24)
BUN: 15 mg/dL (ref 8–27)
Bilirubin Total: 0.4 mg/dL (ref 0.0–1.2)
CO2: 26 mmol/L (ref 20–29)
Calcium: 9.1 mg/dL (ref 8.6–10.2)
Chloride: 100 mmol/L (ref 96–106)
Creatinine, Ser: 0.76 mg/dL (ref 0.76–1.27)
Globulin, Total: 1.7 g/dL (ref 1.5–4.5)
Glucose: 126 mg/dL — ABNORMAL HIGH (ref 70–99)
Potassium: 4.1 mmol/L (ref 3.5–5.2)
Sodium: 142 mmol/L (ref 134–144)
Total Protein: 5.8 g/dL — ABNORMAL LOW (ref 6.0–8.5)
eGFR: 95 mL/min/{1.73_m2} (ref >59)

## 2023-11-30 LAB — PSA TOTAL (REFLEX TO FREE): Prostate Specific Ag, Serum: 0.4 ng/mL (ref 0.0–4.0)

## 2023-11-30 LAB — CBC
Hematocrit: 43.7 % (ref 37.5–51.0)
Hemoglobin: 14.7 g/dL (ref 13.0–17.7)
MCH: 32 pg (ref 26.6–33.0)
MCHC: 33.6 g/dL (ref 31.5–35.7)
MCV: 95 fL (ref 79–97)
Platelets: 327 10*3/uL (ref 150–450)
RBC: 4.59 x10E6/uL (ref 4.14–5.80)
RDW: 12.7 % (ref 11.6–15.4)
WBC: 9.8 10*3/uL (ref 3.4–10.8)

## 2023-11-30 LAB — VITAMIN D 25 HYDROXY (VIT D DEFICIENCY, FRACTURES): Vit D, 25-Hydroxy: 24.6 ng/mL — ABNORMAL LOW (ref 30.0–100.0)

## 2023-11-30 LAB — LIPID PANEL
Chol/HDL Ratio: 5.6 {ratio} — ABNORMAL HIGH (ref 0.0–5.0)
Cholesterol, Total: 150 mg/dL (ref 100–199)
HDL: 27 mg/dL — ABNORMAL LOW (ref >39)
LDL Chol Calc (NIH): 92 mg/dL (ref 0–99)
Triglycerides: 175 mg/dL — ABNORMAL HIGH (ref 0–149)
VLDL Cholesterol Cal: 31 mg/dL (ref 5–40)

## 2023-11-30 LAB — HEMOGLOBIN A1C
Est. average glucose Bld gHb Est-mCnc: 166 mg/dL
Hgb A1c MFr Bld: 7.4 % — ABNORMAL HIGH (ref 4.8–5.6)

## 2023-11-30 LAB — MICROALBUMIN / CREATININE URINE RATIO
Creatinine, Urine: 231.6 mg/dL
Microalb/Creat Ratio: 6 mg/g{creat} (ref 0–29)
Microalbumin, Urine: 15 ug/mL

## 2023-11-30 MED ORDER — PITAVASTATIN CALCIUM 2 MG PO TABS: 2.0000 mg | ORAL_TABLET | Freq: Every day | ORAL | 2 refills | Status: DC

## 2023-11-30 NOTE — Telephone Encounter (Signed)
 Prescription sent to pharmacy today via separate encounter.

## 2023-11-30 NOTE — Telephone Encounter (Signed)
 [facility] pharmacy is requesting refill Pitavastatin  Calcium  2 MG TABS   Please advise

## 2023-12-01 ENCOUNTER — Ambulatory Visit: Payer: Medicare PPO

## 2023-12-05 ENCOUNTER — Ambulatory Visit: Payer: Medicare PPO | Admitting: Physical Therapy

## 2023-12-07 ENCOUNTER — Ambulatory Visit: Payer: Medicare PPO | Admitting: Physical Therapy

## 2023-12-10 DIAGNOSIS — E119 Type 2 diabetes mellitus without complications: Secondary | ICD-10-CM | POA: Diagnosis not present

## 2023-12-12 ENCOUNTER — Ambulatory Visit: Payer: Medicare PPO | Admitting: Physical Therapy

## 2023-12-13 ENCOUNTER — Encounter: Payer: Self-pay | Admitting: Family Medicine

## 2023-12-13 ENCOUNTER — Other Ambulatory Visit: Payer: Self-pay | Admitting: Family Medicine

## 2023-12-13 ENCOUNTER — Ambulatory Visit
Admission: RE | Admit: 2023-12-13 | Discharge: 2023-12-13 | Disposition: A | Discharge status: Home / Self Care | Payer: Medicare PPO | Source: Ambulatory Visit | Attending: Family Medicine | Admitting: Family Medicine

## 2023-12-13 DIAGNOSIS — I719 Aortic aneurysm of unspecified site, without rupture: Secondary | ICD-10-CM | POA: Insufficient documentation

## 2023-12-13 DIAGNOSIS — I7143 Infrarenal abdominal aortic aneurysm, without rupture: Secondary | ICD-10-CM | POA: Diagnosis not present

## 2023-12-13 NOTE — Progress Notes (Signed)
 Complete physical exam   Patient: Thomas Morgan   DOB: June 24, 1951   73 y.o. Male  MRN: [id number] Visit Date: 11/28/2023  Today's healthcare provider: Nancyann Perry, MD   Chief Complaint  Patient presents with   Annual Exam   Subjective    Thomas Morgan is a 73 y.o. male who presents today for a complete physical exam.  He reports consuming a  healthy  diet.  Exercises regularly  He generally feels well. He reports sleeping well. He does not have additional problems to discuss today.   Is also due to follow up htn, lipids, diabetes vitamin d  deficiency, and neuropathy. Doing well current medications. However he stopped lovastatin  due to muscle pains, which he also had with atorvastatin  fluvastatin and rosuvastatin. He has known vascular disease and need to reduce LDL at least below 70 and ideally below 55.     Past Medical History:  Diagnosis Date   Allergy Child   Arthritis 1970s   Basal cell carcinoma    Complication of anesthesia    GERD (gastroesophageal reflux disease)    Neuromuscular disorder (HCC) 2023   Personal history of tobacco use, presenting hazards to health 10/22/2015   PONV (postoperative nausea and vomiting)    gets very sick from anesthesia   Spinal stenosis    Past Surgical History:  Procedure Laterality Date   BACK SURGERY     BASAL CELL CARCINOMA EXCISION Left 2020   left arm   CYST REMOVAL TRUNK  2020   on his back   ELBOW SURGERY     EXCISION MORTON'S NEUROMA Left 03/24/2017   Procedure: EXCISION MORTON'S NEUROMA;  Surgeon: Neill Boas, DPM;  Location: [facility] ORS;  Service: Podiatry;  Laterality: Left;   FOOT SURGERY Right 04/16/2021   FRACTURE SURGERY  1970s   Left elbow. Right wrist   SKIN CANCER EXCISION     SPINE SURGERY     WRIST SURGERY Right    Trauma resulting in prosthetics in the right wrist and elbow   Social History   Socioeconomic History   Marital status: Married    Spouse name: Not on file   Number of children: 2    Years of education: Not on file   Highest education level: Associate degree: occupational, scientist, product/process development, or vocational program  Occupational History   Not on file  Tobacco Use   Smoking status: Every Day    Current packs/day: 1.00    Average packs/day: 1 pack/day for 48.0 years (48.0 ttl pk-yrs)    Types: Cigarettes   Smokeless tobacco: Never   Tobacco comments:    1 pack a day,   Vaping Use   Vaping status: Never Used  Substance and Sexual Activity   Alcohol use: No   Drug use: No   Sexual activity: Not Currently    Birth control/protection: None  Other Topics Concern   Not on file  Social History Narrative   Not on file   Social Drivers of Health   Financial Resource Strain: Low Risk  (11/24/2023)   Overall Financial Resource Strain (CARDIA)    Difficulty of Paying Living Expenses: Not very hard  Food Insecurity: No Food Insecurity (11/24/2023)   Hunger Vital Sign    Worried About Running Out of Food in the Last Year: Never true    Ran Out of Food in the Last Year: Never true  Transportation Needs: No Transportation Needs (11/24/2023)   PRAPARE - Transportation    Lack of  Transportation (Medical): No    Lack of Transportation (Non-Medical): No  Physical Activity: Unknown (11/24/2023)   Exercise Vital Sign    Days of Exercise per Week: 0 days    Minutes of Exercise per Session: Not on file  Stress: No Stress Concern Present (11/24/2023)   [employer] of Occupational Health - Occupational Stress Questionnaire    Feeling of Stress : Not at all  Social Connections: Socially Integrated (11/24/2023)   Social Connection and Isolation Panel [NHANES]    Frequency of Communication with Friends and Family: Three times a week    Frequency of Social Gatherings with Friends and Family: More than three times a week    Attends Religious Services: More than 4 times per year    Active Member of Clubs or Organizations: Yes    Attends Banker Meetings: More than 4 times per  year    Marital Status: Married  Catering Manager Violence: Not At Risk (06/21/2023)   Humiliation, Afraid, Rape, and Kick questionnaire    Fear of Current or Ex-Partner: No    Emotionally Abused: No    Physically Abused: No    Sexually Abused: No   Family Status  Relation Name Status   Mother Haver Deceased at age 19       died from lung cancer;colorectal adenoma   Father  Deceased at age 20       AAA rupture   Brother  Alive   Brother  Alive   Neg Hx  (Not Specified)  No partnership data on file   Family History  Problem Relation Age of Onset   Cancer Mother        Lung   Hypertension Mother    Arthritis Mother    Heart Problems Father    AAA (abdominal aortic aneurysm) Father    Heart attack Brother    Hypercholesterolemia Brother    Neuropathy Neg Hx    Allergies  Allergen Reactions   Hydrocodone Other (See Comments)    Palpitations, increased heart rate   Atorvastatin      Aching and feeling down   Crestor [Rosuvastatin] Other (See Comments)    Muscle aches   Lescol  [Fluvastatin]     Muscle Aches    Patient Care Team: Gasper Nancyann BRAVO, MD as PCP - General (Family Medicine) Maree Jannett POUR, MD as Consulting Physician (Neurology) Unice Pac, MD as Consulting Physician (Neurosurgery) Gretta Lonni PARAS, MD as Consulting Physician (Vascular Surgery) Pa, [facility] (Optometry)   Medications: Outpatient Medications Prior to Visit  Medication Sig   ezetimibe  (ZETIA ) 10 MG tablet TAKE 1 TABLET(10 MG) BY MOUTH DAILY   gabapentin  (NEURONTIN ) 300 MG capsule Take 300mg  AM, 300mg  PM, 900mg  QHS   glucose blood (ONETOUCH VERIO) test strip TEST ONCE DAILY  Dx: E11.9   hydrochlorothiazide  (HYDRODIURIL ) 50 MG tablet TAKE 1 TABLET BY MOUTH EVERY DAY   lovastatin  (MEVACOR ) 40 MG tablet TAKE 1 TABLET(40 MG) BY MOUTH DAILY   omeprazole  (PRILOSEC) 40 MG capsule TAKE 1 CAPSULE(40 MG) BY MOUTH DAILY   OneTouch Delica Lancets 33G MISC Use to check blood sugar once a  day.  DX: E11.9   traMADol  (ULTRAM ) 50 MG tablet Take 1-2 tablets (50-100 mg total) by mouth at bedtime as needed.   No facility-administered medications prior to visit.    Review of Systems  Constitutional:  Negative for chills, diaphoresis and fever.  HENT:  Negative for congestion, ear discharge, ear pain, hearing loss, nosebleeds, sore throat and  tinnitus.   Eyes:  Negative for photophobia, pain, discharge and redness.  Respiratory:  Negative for cough, shortness of breath, wheezing and stridor.   Cardiovascular:  Negative for chest pain, palpitations and leg swelling.  Gastrointestinal:  Negative for abdominal pain, blood in stool, constipation, diarrhea, nausea and vomiting.  Endocrine: Negative for polydipsia.  Genitourinary:  Negative for dysuria, flank pain, frequency, hematuria and urgency.  Musculoskeletal:  Negative for back pain, myalgias and neck pain.  Skin:  Negative for rash.  Allergic/Immunologic: Negative for environmental allergies.  Neurological:  Negative for dizziness, tremors, seizures, weakness and headaches.  Hematological:  Does not bruise/bleed easily.  Psychiatric/Behavioral:  Negative for hallucinations and suicidal ideas. The patient is not nervous/anxious.      Objective    BP 135/81 (BP Location: Right Arm, Patient Position: Sitting, Cuff Size: Normal)   Pulse 64   Resp 16   Ht 5' 10 (1.778 m)   Wt 194 lb 1.6 oz (88 kg)   SpO2 98%   BMI 27.85 kg/m    Physical Exam   General Appearance:    Well developed, well nourished male. Alert, cooperative, in no acute distress, appears stated age  Head:    Normocephalic, without obvious abnormality, atraumatic  Eyes:    PERRL, conjunctiva/corneas clear, EOM's intact, fundi    benign, both eyes       Ears:    Normal TM's and external ear canals, both ears  Nose:   Nares normal, septum midline, mucosa normal, no drainage   or sinus tenderness  Throat:   Lips, mucosa, and tongue normal; teeth and gums  normal  Neck:   Supple, symmetrical, trachea midline, no adenopathy;       thyroid :  No enlargement/tenderness/nodules; no carotid   bruit or JVD  Back:     Symmetric, no curvature, ROM normal, no CVA tenderness  Lungs:     Clear to auscultation bilaterally, respirations unlabored  Chest wall:    No tenderness or deformity  Heart:    Normal heart rate. Normal rhythm. No murmurs, rubs, or gallops.  S1 and S2 normal  Abdomen:     Soft, non-tender, bowel sounds active all four quadrants,    no masses, no organomegaly  Genitalia:    deferred  Rectal:    deferred  Extremities:   All extremities are intact. No cyanosis or edema  Pulses:   2+ and symmetric all extremities  Skin:   Skin color, texture, turgor normal, no rashes or lesions  Lymph nodes:   Cervical, supraclavicular, and axillary nodes normal  Neurologic:   CNII-XII intact. Normal strength, sensation and reflexes      throughout     Last depression screening scores    11/28/2023    8:16 AM 06/21/2023   12:21 PM 10/28/2022   10:40 AM  PHQ 2/9 Scores  PHQ - 2 Score 0 0 0  PHQ- 9 Score   3   Last fall risk screening    11/28/2023    8:16 AM  Fall Risk   Falls in the past year? 0  Number falls in past yr: 0  Injury with Fall? 0  Risk for fall due to : No Fall Risks   Last Audit-C alcohol use screening    11/24/2023    8:46 AM  Alcohol Use Disorder Test (AUDIT)  1. How often do you have a drink containing alcohol? 0   A score of 3 or more in women, and 4 or more in  men indicates increased risk for alcohol abuse, EXCEPT if all of the points are from question 1     Assessment & Plan    Routine Health Maintenance and Physical Exam  Exercise Activities and Dietary recommendations  Goals      DIET - EAT MORE FRUITS AND VEGETABLES     Exercise 150 min/wk Moderate Activity     Recommend to continue exercising 3 days a week for 1 hour.       Quit Smoking     Recommend to continue efforts to reduce smoking habits until  no longer smoking.         Immunization History  Administered Date(s) Administered   Fluad Quad(high Dose 65+) 08/13/2019, 10/05/2020, 10/13/2021, 10/13/2021, 10/28/2022   Fluad Trivalent(High Dose 65+) 08/21/2023   Influenza, High Dose Seasonal PF 10/08/2015, 07/05/2016, 07/27/2018   Influenza-Unspecified 12/01/2017   PFIZER(Purple Top)SARS-COV-2 Vaccination 12/26/2019, 01/16/2020   Pneumococcal Conjugate-13 10/08/2015   Pneumococcal Polysaccharide-23 09/10/2008, 12/12/2016   Td 10/24/1997   Tdap 06/06/2007    Health Maintenance  Topic Date Due   Zoster Vaccines- Shingrix  (1 of 2) Never done   DTaP/Tdap/Td (3 - Td or Tdap) 06/05/2017   COVID-19 Vaccine (3 - Pfizer risk series) 02/13/2020   Colonoscopy  12/19/2021   Lung Cancer Screening  12/29/2023   HEMOGLOBIN A1C  05/27/2024   Medicare Annual Wellness (AWV)  06/20/2024   OPHTHALMOLOGY EXAM  10/01/2024   Diabetic kidney evaluation - eGFR measurement  11/27/2024   Diabetic kidney evaluation - Urine ACR  11/27/2024   Pneumonia Vaccine 61+ Years old  Completed   INFLUENZA VACCINE  Completed   Hepatitis C Screening  Completed   HPV VACCINES  Aged Out    Discussed health benefits of physical activity, and encouraged him to engage in regular exercise appropriate for his age and condition.  Counseled on benefits of pneumococcal and Tetanus vaccines which he is due for, but he declined to get vaccines today.   2. Primary hypertension BP near goal.   3. Type 2 diabetes mellitus with other circulatory complication, without long-term current use of insulin  (HCC) Diet controlled.  - Hemoglobin A1c - Urine Albumin /Creatinine with ratio (send out) [LAB689]  4. Hyperlipidemia, unspecified hyperlipidemia type Intolerant to rosuvastatin, lovastatin , and fluvastatin. .  - CBC - Comprehensive metabolic panel - Lipid panel  Consider trial of pitavastatin  or bempedoic acid .   5. Vitamin D  deficiency  - VITAMIN D  25 Hydroxy  (Vit-D Deficiency, Fractures)  6. Prostate cancer screening  - PSA Total (Reflex To Free)  7. Aortic aneurysm without rupture, unspecified portion of aorta (HCC) Due for follow up  US  AORTA DUPLEX LIMITED; Future        Nancyann Perry, MD  [facility] [phone] (phone) [phone] (fax)  [facility]

## 2023-12-14 ENCOUNTER — Ambulatory Visit: Payer: Medicare PPO | Admitting: Physical Therapy

## 2023-12-15 ENCOUNTER — Telehealth: Payer: Self-pay | Admitting: Family Medicine

## 2023-12-15 ENCOUNTER — Ambulatory Visit: Payer: Medicare PPO | Admitting: Family Medicine

## 2023-12-15 VITALS — BP 126/71 | HR 71 | Temp 98.1°F | Resp 16 | Ht 70.0 in | Wt 197.0 lb

## 2023-12-15 DIAGNOSIS — I7143 Infrarenal abdominal aortic aneurysm, without rupture: Secondary | ICD-10-CM

## 2023-12-15 DIAGNOSIS — M79672 Pain in left foot: Secondary | ICD-10-CM | POA: Diagnosis not present

## 2023-12-15 DIAGNOSIS — M79671 Pain in right foot: Secondary | ICD-10-CM | POA: Diagnosis not present

## 2023-12-15 DIAGNOSIS — E785 Hyperlipidemia, unspecified: Secondary | ICD-10-CM

## 2023-12-15 MED ORDER — PITAVASTATIN CALCIUM 2 MG PO TABS: 2.0000 mg | ORAL_TABLET | Freq: Every day | ORAL | 2 refills | Status: DC

## 2023-12-15 NOTE — Telephone Encounter (Signed)
Pitavastatin is not covered by patient's insurance plan.  Please select another medication.  [facility] [address] [place]

## 2023-12-15 NOTE — Progress Notes (Signed)
Established patient visit   Patient: Thomas Morgan   DOB: 10-26-1950   73 y.o. Male  MRN: [id number] Visit Date: 12/15/2023  Today's healthcare provider: Mila Merry, MD   Chief Complaint  Patient presents with   Circulation Concerns    Patient is a diabetic with neuropathy that is concerned about poor circulation in his feet.  He reports that for the last couple of months he has noticed his feet staying cold without cyanosis.   Subjective    Discussed the use of AI scribe software for clinical note transcription with the patient, who gave verbal consent to proceed.  History of Present Illness   Thomas Morgan is a 73 year old male with an aortic aneurysm who presents for evaluation of blood flow in his feet.  He has a known aortic aneurysm that has recently grown to 5.1 cm, as revealed by a recent ultrasound. There is a family history of aneurysms, as his father died from an aneurysm and his brother has had related surgery. He is a smoker, which is a relevant risk factor.  He experiences very cold feet and has a history of neuropathy. His feet feel cold but there is no bluish discoloration. He recalls having a circulation test done approximately three years ago. He has been using a treatment called Align for neuropathy, which includes soaking his feet in a solution, red light therapy, and a vibrating machine, and he reports some improvement with this regimen. He is considering shoes specifically designed for neuropathy and is under the care of a podiatrist, Dr. Al Corpus.  He recently received a prescription for pitavastatin to replace lovastatin due to low HDL and side effects from lovastatin. He has not yet received the medication from the pharmacy and is awaiting its arrival.       Medications: Outpatient Medications Prior to Visit  Medication Sig   ezetimibe (ZETIA) 10 MG tablet TAKE 1 TABLET(10 MG) BY MOUTH DAILY   gabapentin (NEURONTIN) 300 MG capsule Take 300mg  AM, 300mg   PM, 900mg  QHS   glucose blood (ONETOUCH VERIO) test strip TEST ONCE DAILY  Dx: E11.9   hydrochlorothiazide (HYDRODIURIL) 50 MG tablet TAKE 1 TABLET BY MOUTH EVERY DAY   lovastatin (MEVACOR) 40 MG tablet TAKE 1 TABLET(40 MG) BY MOUTH DAILY   omeprazole (PRILOSEC) 40 MG capsule TAKE 1 CAPSULE(40 MG) BY MOUTH DAILY   OneTouch Delica Lancets 33G MISC Use to check blood sugar once a day.  DX: E11.9   traMADol (ULTRAM) 50 MG tablet Take 1-2 tablets (50-100 mg total) by mouth at bedtime as needed.   [DISCONTINUED] Pitavastatin Calcium 2 MG TABS Take 1 tablet (2 mg total) by mouth daily.   No facility-administered medications prior to visit.      Objective    BP 126/71 (BP Location: Left Arm, Patient Position: Sitting)   Pulse 71   Temp 98.1 F (36.7 C) (Oral)   Resp 16   Ht 5\' 10"  (1.778 m)   Wt 197 lb (89.4 kg)   BMI 28.27 kg/m    Physical Exam   EXTREMITIES: 1+ pedal pulses with cap refill of about 4 seconds bilaterally.    No results found for any visits on 12/15/23.  Assessment & Plan       Abdominal Aortic Aneurysm -Refer to vascular surgery for further evaluation and potential intervention already in process.  LE pain with weak pedal pulses Complaints of cold feet. Unclear if this is due to neuropathy or  potential peripheral vascular disease. Patient has been using Align treatment with some relief. -Order ABIs  Hyperlipidemia with low HDL Transition from Lovastatin to Pitavastatin. -Ensure prescription for Pitavastatin has been filled by pharmacy.    No follow-ups on file.      Mila Merry, MD  [facility] [phone] (phone) [phone] (fax)  [facility]

## 2023-12-17 MED ORDER — BEMPEDOIC ACID 180 MG PO TABS: 180.0000 mg | ORAL_TABLET | Freq: Every day | ORAL | 3 refills | Status: DC

## 2023-12-17 NOTE — Addendum Note (Signed)
 Addended by: Malva Limes on: 12/17/2023 05:10 PM   Modules accepted: Orders

## 2023-12-19 ENCOUNTER — Ambulatory Visit: Payer: Medicare PPO | Admitting: Physical Therapy

## 2023-12-20 ENCOUNTER — Other Ambulatory Visit (HOSPITAL_COMMUNITY): Payer: Self-pay

## 2023-12-20 ENCOUNTER — Telehealth: Payer: Self-pay

## 2023-12-20 NOTE — Telephone Encounter (Signed)
 Pharmacy Patient Advocate Encounter   Received notification from Pt Calls Messages that prior authorization for Nexletol 180MG  tablets is required/requested.   Insurance verification completed.   The patient is insured through [employer] .   Per test claim: PA required; PA submitted to above mentioned insurance via CoverMyMeds Key/confirmation #/EOC [id number] Status is pending

## 2023-12-21 ENCOUNTER — Ambulatory Visit: Payer: Medicare PPO | Admitting: Physical Therapy

## 2023-12-22 ENCOUNTER — Other Ambulatory Visit (HOSPITAL_COMMUNITY): Payer: Self-pay

## 2023-12-22 NOTE — Telephone Encounter (Signed)
 Pharmacy Patient Advocate Encounter  Received notification from [facility] that Prior Authorization for Nexletol 180MG  tablets  has been APPROVED from 10/25/23 to 10/23/24. Ran test claim, Copay is $64. This test claim was processed through [facility]- copay amounts may vary at other pharmacies due to pharmacy/plan contracts, or as the patient moves through the different stages of their insurance plan.   PA #/Case ID/Reference #: [id number]

## 2023-12-25 ENCOUNTER — Telehealth: Payer: Self-pay | Admitting: Family Medicine

## 2023-12-25 NOTE — Telephone Encounter (Signed)
 Copied from CRM [id number]. Topic: Clinical - Prescription Issue >> Dec 25, 2023 10:03 AM Everette C wrote: Reason for CRM: The patient would like to speak with a member of clinical staff when possible  The patient has been notified that their prescription for Bempedoic Acid 180 MG TABS [[id number]] will be $64 monthly and the patient would like to discuss a cost effective alternative  Please contact the patient further when available

## 2023-12-25 NOTE — Telephone Encounter (Signed)
 Please disgraud Dr Sherrie Mustache I meant to send this to Dr Roxan Hockey

## 2023-12-25 NOTE — Telephone Encounter (Signed)
 Pleaser see the message below and advise

## 2023-12-26 ENCOUNTER — Ambulatory Visit: Payer: Medicare PPO | Admitting: Physical Therapy

## 2023-12-26 ENCOUNTER — Telehealth: Payer: Self-pay

## 2023-12-26 NOTE — Telephone Encounter (Unsigned)
 Copied from CRM [id number]. Topic: Referral - Status >> Dec 25, 2023  9:58 AM Everette C wrote: Reason for CRM: The patient has called to follow up on their previous request for a referral to a vascular surgeon   Please contact the patient further when possible

## 2023-12-27 ENCOUNTER — Other Ambulatory Visit: Payer: Self-pay

## 2023-12-27 ENCOUNTER — Telehealth: Payer: Self-pay | Admitting: Family Medicine

## 2023-12-27 MED ORDER — EZETIMIBE 10 MG PO TABS: 10.0000 mg | ORAL_TABLET | Freq: Every day | ORAL | 3 refills | Status: AC

## 2023-12-27 NOTE — Telephone Encounter (Signed)
 [facility] pharmacy is requesting refill ezetimibe (ZETIA) 10 MG tablet  Please advise

## 2023-12-27 NOTE — Telephone Encounter (Signed)
 Only alternative is statins and he has tried them all and didn't tolerate any of them. He can get 3 months supply of bempedoic acid from his mail order for $128.  Can send in 90 day supply to mail order if he prefers.

## 2023-12-28 ENCOUNTER — Ambulatory Visit: Payer: Medicare PPO | Admitting: Physical Therapy

## 2023-12-29 NOTE — Telephone Encounter (Signed)
 Called patient. Gave him provider's message. Patient state at this moment he is not able to afford it. He is going to stay taking Lovastatin and Zetia. Reports that he is able to tolerate Lovastatin.  FYI-I do not see Lovastatin on his current med list

## 2023-12-30 NOTE — Addendum Note (Signed)
 Addended by: Malva Limes on: 12/30/2023 08:12 AM   Modules accepted: Orders

## 2024-01-01 ENCOUNTER — Ambulatory Visit
Admission: RE | Admit: 2024-01-01 | Discharge: 2024-01-01 | Disposition: A | Discharge status: Home / Self Care | Payer: Medicare PPO | Source: Ambulatory Visit | Attending: Acute Care | Admitting: Acute Care

## 2024-01-01 DIAGNOSIS — Z87891 Personal history of nicotine dependence: Secondary | ICD-10-CM | POA: Diagnosis present

## 2024-01-01 DIAGNOSIS — Z122 Encounter for screening for malignant neoplasm of respiratory organs: Secondary | ICD-10-CM | POA: Diagnosis present

## 2024-01-01 DIAGNOSIS — F17210 Nicotine dependence, cigarettes, uncomplicated: Secondary | ICD-10-CM | POA: Diagnosis present

## 2024-01-02 ENCOUNTER — Ambulatory Visit: Payer: Medicare PPO | Admitting: Physical Therapy

## 2024-01-03 ENCOUNTER — Encounter: Payer: Medicare PPO | Admitting: Neurology

## 2024-01-03 ENCOUNTER — Ambulatory Visit (INDEPENDENT_AMBULATORY_CARE_PROVIDER_SITE_OTHER)

## 2024-01-03 ENCOUNTER — Telehealth: Payer: Self-pay | Admitting: Family Medicine

## 2024-01-03 DIAGNOSIS — M79671 Pain in right foot: Secondary | ICD-10-CM

## 2024-01-03 DIAGNOSIS — M79672 Pain in left foot: Secondary | ICD-10-CM | POA: Diagnosis not present

## 2024-01-03 NOTE — Telephone Encounter (Signed)
 Copied from CRM [id number]. Topic: Referral - Request for Referral >> Jan 03, 2024 11:45 AM Higinio Roger wrote: Did the patient discuss referral with their provider in the last year? Yes (If No - schedule appointment) (If Yes - send message)  Appointment offered? No  Type of order/referral and detailed reason for visit: aneurysm   Preference of office, provider, location: [facility]  If referral order, have you been seen by this specialty before? Yes (If Yes, this issue or another issue? When? Where?  Can we respond through MyChart? Yes

## 2024-01-04 ENCOUNTER — Ambulatory Visit: Payer: Medicare PPO | Admitting: Physical Therapy

## 2024-01-04 LAB — VAS US ABI WITH/WO TBI
Left ABI: 1.2
Right ABI: 1.14

## 2024-01-04 NOTE — Telephone Encounter (Signed)
 Maralyn Sago do you the update on this referral? It was placed 12/13/2023

## 2024-01-08 ENCOUNTER — Encounter (INDEPENDENT_AMBULATORY_CARE_PROVIDER_SITE_OTHER): Payer: Self-pay | Admitting: Nurse Practitioner

## 2024-01-08 ENCOUNTER — Ambulatory Visit (INDEPENDENT_AMBULATORY_CARE_PROVIDER_SITE_OTHER): Admitting: Nurse Practitioner

## 2024-01-08 VITALS — BP 124/81 | HR 70 | Resp 18 | Ht 70.0 in | Wt 196.4 lb

## 2024-01-08 DIAGNOSIS — I739 Peripheral vascular disease, unspecified: Secondary | ICD-10-CM | POA: Diagnosis not present

## 2024-01-08 DIAGNOSIS — I7143 Infrarenal abdominal aortic aneurysm, without rupture: Secondary | ICD-10-CM

## 2024-01-08 DIAGNOSIS — I10 Essential (primary) hypertension: Secondary | ICD-10-CM | POA: Diagnosis not present

## 2024-01-09 ENCOUNTER — Ambulatory Visit: Payer: Medicare PPO | Admitting: Physical Therapy

## 2024-01-09 NOTE — Progress Notes (Addendum)
 Subjective:    Patient ID: Thomas Morgan, male    DOB: [birth year], 73 y.o.   MRN: [id number] Chief Complaint  Patient presents with   New Patient (Initial Visit)    Consult. 5.1 cm Infrarenal Abdonimal Aortic Anerysm without rupture  WNU:[id number]    The patient returns to the office for surveillance of a known abdominal aortic aneurysm. Patient denies new onset of abdominal pain or unusual back pain, no other abdominal complaints. No changes suggesting embolic episodes.  When we last saw the patient his aneurysm measured approximately 3.9 cm.  A recent ultrasound shows it measures 5.1 cm currently.  There have been no significant interval changes in the patient's overall health care since his last visit.  Patient denies amaurosis fugax or TIA symptoms. There is no history of claudication or rest pain symptoms of the lower extremities. The patient denies angina or shortness of breath.   Duplex US of the aorta and iliac arteries shows an AAA measured 5.1 cm.  Additionally the patient had ABIs done which showed an ABI of 1.14 on the right and 1.20 on the left.  He has strong triphasic tibial waveforms bilaterally with normal TBI's bilaterally and normal toe waveforms.    Review of Systems  Musculoskeletal:  Positive for arthralgias.  Neurological:  Positive for numbness.  All other systems reviewed and are negative.      Objective:   Physical Exam Vitals reviewed.  HENT:     Head: Normocephalic.  Cardiovascular:     Rate and Rhythm: Normal rate.     Pulses: Normal pulses.  Pulmonary:     Effort: Pulmonary effort is normal.  Skin:    General: Skin is warm and dry.  Neurological:     Mental Status: He is alert and oriented to person, place, and time.  Psychiatric:        Mood and Affect: Mood normal.        Behavior: Behavior normal.        Thought Content: Thought content normal.        Judgment: Judgment normal.     BP 124/81   Pulse 70   Resp 18   Ht 5\' 10"   (1.778 m)   Wt 196 lb 6.4 oz (89.1 kg)   BMI 28.18 kg/m   Past Medical History:  Diagnosis Date   Allergy Child   Arthritis 1970s   Basal cell carcinoma    Complication of anesthesia    GERD (gastroesophageal reflux disease)    Neuromuscular disorder (HCC) 2023   Personal history of tobacco use, presenting hazards to health 10/22/2015   PONV (postoperative nausea and vomiting)    gets very sick from anesthesia   Spinal stenosis     Social History   Socioeconomic History   Marital status: Married    Spouse name: Not on file   Number of children: 2   Years of education: Not on file   Highest education level: Associate degree: occupational, Scientist, product/process development, or vocational program  Occupational History   Not on file  Tobacco Use   Smoking status: Every Day    Current packs/day: 1.00    Average packs/day: 1 pack/day for 48.0 years (48.0 ttl pk-yrs)    Types: Cigarettes   Smokeless tobacco: Never   Tobacco comments:    1 pack a day,   Vaping Use   Vaping status: Never Used  Substance and Sexual Activity   Alcohol use: No   Drug use: No  Sexual activity: Not Currently    Birth control/protection: None  Other Topics Concern   Not on file  Social History Narrative   Not on file   Social Drivers of Health   Financial Resource Strain: Low Risk  (11/24/2023)   Overall Financial Resource Strain (CARDIA)    Difficulty of Paying Living Expenses: Not very hard  Food Insecurity: No Food Insecurity (11/24/2023)   Hunger Vital Sign    Worried About Running Out of Food in the Last Year: Never true    Ran Out of Food in the Last Year: Never true  Transportation Needs: No Transportation Needs (11/24/2023)   PRAPARE - Administrator, Civil Service (Medical): No    Lack of Transportation (Non-Medical): No  Physical Activity: Unknown (11/24/2023)   Exercise Vital Sign    Days of Exercise per Week: 0 days    Minutes of Exercise per Session: Not on file  Stress: No Stress  Concern Present (11/24/2023)   [employer] of Occupational Health - Occupational Stress Questionnaire    Feeling of Stress : Not at all  Social Connections: Socially Integrated (11/24/2023)   Social Connection and Isolation Panel [NHANES]    Frequency of Communication with Friends and Family: Three times a week    Frequency of Social Gatherings with Friends and Family: More than three times a week    Attends Religious Services: More than 4 times per year    Active Member of [employer] or Organizations: Yes    Attends Engineer, structural: More than 4 times per year    Marital Status: Married  Catering manager Violence: Not At Risk (06/21/2023)   Humiliation, Afraid, Rape, and Kick questionnaire    Fear of Current or Ex-Partner: No    Emotionally Abused: No    Physically Abused: No    Sexually Abused: No    Past Surgical History:  Procedure Laterality Date   BACK SURGERY     BASAL CELL CARCINOMA EXCISION Left 2020   left arm   CYST REMOVAL TRUNK  2020   on his back   ELBOW SURGERY     EXCISION MORTON'S NEUROMA Left 03/24/2017   Procedure: EXCISION MORTON'S NEUROMA;  Surgeon: Linus Galas, DPM;  Location: [facility] ORS;  Service: Podiatry;  Laterality: Left;   FOOT SURGERY Right 04/16/2021   FRACTURE SURGERY  1970s   Left elbow. Right wrist   SKIN CANCER EXCISION     SPINE SURGERY     WRIST SURGERY Right    Trauma resulting in prosthetics in the right wrist and elbow    Family History  Problem Relation Age of Onset   Cancer Mother        Lung   Hypertension Mother    Arthritis Mother    Heart Problems Father    AAA (abdominal aortic aneurysm) Father    Heart attack Brother    Hypercholesterolemia Brother    Neuropathy Neg Hx     Allergies  Allergen Reactions   Hydrocodone Other (See Comments)    Palpitations, increased heart rate   Lovastatin     Muscle pain   Statins    Atorvastatin     Aching and feeling down   Crestor [Rosuvastatin] Other (See Comments)     Muscle aches   Lescol  [Fluvastatin]     Muscle Aches       Latest Ref Rng & Units 11/28/2023    8:52 AM 10/28/2022   11:19 AM 06/24/2022   10:56  AM  CBC  WBC 3.4 - 10.8 x10E3/uL 9.8  10.6  16.7   Hemoglobin 13.0 - 17.7 g/dL 40.9  81.1  91.4   Hematocrit 37.5 - 51.0 % 43.7  43.1  38.9   Platelets 150 - 450 x10E3/uL 327  394  323       CMP     Component Value Date/Time   NA 142 11/28/2023 0852   K 4.1 11/28/2023 0852   CL 100 11/28/2023 0852   CO2 26 11/28/2023 0852   GLUCOSE 126 (H) 11/28/2023 0852   GLUCOSE 154 (H) 06/19/2022 0133   BUN 15 11/28/2023 0852   CREATININE 0.76 11/28/2023 0852   CALCIUM 9.1 11/28/2023 0852   PROT 5.8 (L) 11/28/2023 0852   ALBUMIN 4.1 11/28/2023 0852   AST 18 11/28/2023 0852   ALT 17 11/28/2023 0852   ALKPHOS 84 11/28/2023 0852   BILITOT 0.4 11/28/2023 0852   EGFR 95 11/28/2023 0852   GFRNONAA >60 07/01/2022 0638     VAS [place] ABI WITH/WO TBI Result Date: 01/04/2024  LOWER EXTREMITY DOPPLER STUDY Patient Name:  TRUSTIN CHAPA  Date of Exam:   01/03/2024 Medical Rec #: [id number]      Accession #:    [id number] Date of Birth: 1950/11/21      Patient Gender: M Patient Age:   35 years Exam Location:  [facility] Procedure:      VAS [place] ABI WITH/WO TBI Referring Phys: --------------------------------------------------------------------------------  Indications: Rest pain.  Comparison Study: 12/2020 Performing Technologist: Salvadore Farber RVT  Examination Guidelines: A complete evaluation includes at minimum, Doppler waveform signals and systolic blood pressure reading at the level of bilateral brachial, anterior tibial, and posterior tibial arteries, when vessel segments are accessible. Bilateral testing is considered an integral part of a complete examination. Photoelectric Plethysmograph (PPG) waveforms and toe systolic pressure readings are included as required and additional duplex testing as needed. Limited examinations for reoccurring  indications may be performed as noted.  ABI Findings: +---------+------------------+-----+---------+--------+ Right    Rt Pressure (mmHg)IndexWaveform Comment  +---------+------------------+-----+---------+--------+ Brachial 131                                      +---------+------------------+-----+---------+--------+ PTA      147               1.12 triphasic         +---------+------------------+-----+---------+--------+ DP       149               1.14 triphasic         +---------+------------------+-----+---------+--------+ Joie Bimler               1.40 Normal            +---------+------------------+-----+---------+--------+ +---------+------------------+-----+---------+-------+ Left     Lt Pressure (mmHg)IndexWaveform Comment +---------+------------------+-----+---------+-------+ Brachial 127                                     +---------+------------------+-----+---------+-------+ PTA      147               1.12 triphasic        +---------+------------------+-----+---------+-------+ DP       157               1.20 triphasic        +---------+------------------+-----+---------+-------+  Great Toe135               1.03 Normal           +---------+------------------+-----+---------+-------+ +-------+-----------+-----------+------------+------------+ ABI/TBIToday's ABIToday's TBIPrevious ABIPrevious TBI +-------+-----------+-----------+------------+------------+ Right  1.14       1.40       1.18        .92          +-------+-----------+-----------+------------+------------+ Left   1.20       1.03       1.20        .86          +-------+-----------+-----------+------------+------------+ Bilateral ABIs and TBIs appear essentially unchanged compared to prior study on 2022.  Summary: Right: Resting right ankle-brachial index is within normal range. The right toe-brachial index is normal. Left: Resting left ankle-brachial index is within  normal range. The left toe-brachial index is normal. *See table(s) above for measurements and observations.  Electronically signed by Levora Dredge MD on 01/04/2024 at 3:57:28 PM.    Final        Assessment & Plan:   1. Infrarenal abdominal aortic aneurysm (AAA) without rupture (HCC) (Primary) Recommend:   The patient has an abdominal aortic aneurysm that is 5.0 cm or greater by duplex scan.  The patient is otherwise in reasonable health.   Therefore, the patient should undergo repair of the AAA to prevent future leathal rupture.   Patient will require CT angiography of the abdomen and pelvis in order to appropriately plan repair of the AAA.    The risks and benefits as well as the alternative therapies was discussed in detail with the patient. All questions were answered. The patient agrees to move forward the AAA repair.  Therefore, a CT angiogram with be scheduled as an outpatient.  The patient will follow up with me in the office after the CT scan to review the study and finalize plan for repair. - CT Angio Abd/Pel w/ and/or w/o; Future  2. Primary hypertension Continue antihypertensive medications as already ordered, these medications have been reviewed and there are no changes at this time.  3. PAD (peripheral artery disease) (HCC) Today noninvasive study showed normal ABIs with no evidence of significant arterial insufficiency.  No further intervention necessary at this time.   Current Outpatient Medications on File Prior to Visit  Medication Sig Dispense Refill   ezetimibe (ZETIA) 10 MG tablet Take 1 tablet (10 mg total) by mouth daily. 90 tablet 3   gabapentin (NEURONTIN) 300 MG capsule Take 300mg  AM, 300mg  PM, 900mg  QHS 450 capsule 3   glucose blood (ONETOUCH VERIO) test strip TEST ONCE DAILY  Dx: E11.9 100 each 3   hydrochlorothiazide (HYDRODIURIL) 50 MG tablet TAKE 1 TABLET BY MOUTH EVERY DAY 90 tablet 0   lovastatin (MEVACOR) 40 MG tablet Take 40 mg by mouth daily.      omeprazole (PRILOSEC) 40 MG capsule TAKE 1 CAPSULE(40 MG) BY MOUTH DAILY 90 capsule 4   OneTouch Delica Lancets 33G MISC Use to check blood sugar once a day.  DX: E11.9 100 each 3   traMADol (ULTRAM) 50 MG tablet Take 1-2 tablets (50-100 mg total) by mouth at bedtime as needed. 60 tablet 5   No current facility-administered medications on file prior to visit.    There are no Patient Instructions on file for this visit. No follow-ups on file.   Georgiana Spinner, NP

## 2024-01-11 ENCOUNTER — Ambulatory Visit: Payer: Medicare PPO | Admitting: Physical Therapy

## 2024-01-11 ENCOUNTER — Telehealth (INDEPENDENT_AMBULATORY_CARE_PROVIDER_SITE_OTHER): Payer: Self-pay | Admitting: Nurse Practitioner

## 2024-01-11 NOTE — Telephone Encounter (Signed)
 LVM for pt to call radiology scheduling at [phone] and make the CT appt ordered by Sheppard Plumber, NP. I also asked for a call back to [facility] to make an appt to get the CT results.

## 2024-01-12 ENCOUNTER — Ambulatory Visit
Admission: RE | Admit: 2024-01-12 | Discharge: 2024-01-12 | Disposition: A | Discharge status: Home / Self Care | Source: Ambulatory Visit | Attending: Nurse Practitioner | Admitting: Nurse Practitioner

## 2024-01-12 DIAGNOSIS — I7143 Infrarenal abdominal aortic aneurysm, without rupture: Secondary | ICD-10-CM | POA: Insufficient documentation

## 2024-01-12 MED ORDER — IOHEXOL 350 MG/ML SOLN
100.0000 mL | Freq: Once | INTRAVENOUS | Status: AC | PRN
  Administered 2024-01-12: 100 mL via INTRAVENOUS

## 2024-01-16 ENCOUNTER — Ambulatory Visit: Payer: Medicare PPO | Admitting: Physical Therapy

## 2024-01-18 ENCOUNTER — Ambulatory Visit: Payer: Medicare PPO | Admitting: Physical Therapy

## 2024-01-21 ENCOUNTER — Other Ambulatory Visit: Payer: Self-pay | Admitting: Family Medicine

## 2024-01-21 DIAGNOSIS — I10 Essential (primary) hypertension: Secondary | ICD-10-CM

## 2024-01-23 ENCOUNTER — Ambulatory Visit: Payer: Medicare PPO | Admitting: Physical Therapy

## 2024-01-24 NOTE — Progress Notes (Unsigned)
 MRN : [id number]  Thomas Morgan is a 73 y.o. (1951-02-05) male who presents with chief complaint of check circulation.  History of Present Illness:   The patient is seen for follow up evaluation of AAA status post CTA. There were no problems or complications related to the CT scan.   The patient denies interval development of abdominal or back pain. No new lower extremity pain or discoloration of the toes.   The patient denies interval anaurosis fugax. There is no recent history of TIA symptoms or focal motor deficits.   The patient denies PAD or claudication symptoms.   The patient denies recent episodes of angina or shortness of breath.  CT angiography of the abdomen and pelvis is reviewed by me and shows an infrarenal AAA 4.9 cm.  Although there is an adequate length neck the neck does measure 29 mm in diameter and does have some thrombus present.  No outpatient medications have been marked as taking for the 01/25/24 encounter (Appointment) with Gilda Crease, Latina Craver, MD.    Past Medical History:  Diagnosis Date   Allergy Child   Arthritis 1970s   Basal cell carcinoma    Complication of anesthesia    GERD (gastroesophageal reflux disease)    Neuromuscular disorder (HCC) 2023   Personal history of tobacco use, presenting hazards to health 10/22/2015   PONV (postoperative nausea and vomiting)    gets very sick from anesthesia   Spinal stenosis     Past Surgical History:  Procedure Laterality Date   BACK SURGERY     BASAL CELL CARCINOMA EXCISION Left 2020   left arm   CYST REMOVAL TRUNK  2020   on his back   ELBOW SURGERY     EXCISION MORTON'S NEUROMA Left 03/24/2017   Procedure: EXCISION MORTON'S NEUROMA;  Surgeon: Thomas Morgan, DPM;  Location: [facility] ORS;  Service: Podiatry;  Laterality: Left;   FOOT SURGERY Right 04/16/2021   FRACTURE SURGERY  1970s   Left elbow. Right wrist   SKIN CANCER EXCISION      SPINE SURGERY     WRIST SURGERY Right    Trauma resulting in prosthetics in the right wrist and elbow    Social History Social History   Tobacco Use   Smoking status: Every Day    Current packs/day: 1.00    Average packs/day: 1 pack/day for 48.0 years (48.0 ttl pk-yrs)    Types: Cigarettes   Smokeless tobacco: Never   Tobacco comments:    1 pack a day,   Vaping Use   Vaping status: Never Used  Substance Use Topics   Alcohol use: No   Drug use: No    Family History Family History  Problem Relation Age of Onset   Cancer Mother        Lung   Hypertension Mother    Arthritis Mother    Heart Problems Father    AAA (abdominal aortic aneurysm) Father    Heart attack Brother    Hypercholesterolemia Brother    Neuropathy Neg Hx     Allergies  Allergen Reactions   Hydrocodone  Other (See Comments)    Palpitations, increased heart rate   Lovastatin     Muscle pain   Statins    Atorvastatin     Aching and feeling down   Crestor [Rosuvastatin] Other (See Comments)    Muscle aches   Lescol  [Fluvastatin]     Muscle Aches     REVIEW OF SYSTEMS (Negative unless checked)  Constitutional: [] Weight loss  [] Fever  [] Chills Cardiac: [] Chest pain   [] Chest pressure   [] Palpitations   [] Shortness of breath when laying flat   [] Shortness of breath with exertion. Vascular:  [x] Pain in legs with walking   [] Pain in legs at rest  [] History of DVT   [] Phlebitis   [] Swelling in legs   [] Varicose veins   [] Non-healing ulcers Pulmonary:   [] Uses home oxygen   [] Productive cough   [] Hemoptysis   [] Wheeze  [] COPD   [] Asthma Neurologic:  [] Dizziness   [] Seizures   [] History of stroke   [] History of TIA  [] Aphasia   [] Vissual changes   [] Weakness or numbness in arm   [] Weakness or numbness in leg Musculoskeletal:   [] Joint swelling   [] Joint pain   [] Low back pain Hematologic:  [] Easy bruising  [] Easy bleeding   [] Hypercoagulable state   [] Anemic Gastrointestinal:  [] Diarrhea    [] Vomiting  [] Gastroesophageal reflux/heartburn   [] Difficulty swallowing. Genitourinary:  [] Chronic kidney disease   [] Difficult urination  [] Frequent urination   [] Blood in urine Skin:  [] Rashes   [] Ulcers  Psychological:  [] History of anxiety   []  History of major depression.  Physical Examination  There were no vitals filed for this visit. There is no height or weight on file to calculate BMI. Gen: WD/WN, NAD Head: [place]/AT, No temporalis wasting.  Ear/Nose/Throat: Hearing grossly intact, nares w/o erythema or drainage Eyes: PER, EOMI, sclera nonicteric.  Neck: Supple, no masses.  No bruit or JVD.  Pulmonary:  Good air movement, no audible wheezing, no use of accessory muscles.  Cardiac: RRR, normal S1, S2, no Murmurs. Vascular:  mild trophic changes, no open wounds Vessel Right Left  Radial Palpable Palpable  Gastrointestinal: soft, non-distended. No guarding/no peritoneal signs.  Musculoskeletal: M/S 5/5 throughout.  No visible deformity.  Neurologic: CN 2-12 intact. Pain and light touch intact in extremities.  Symmetrical.  Speech is fluent. Motor exam as listed above. Psychiatric: Judgment intact, Mood & affect appropriate for pt's clinical situation. Dermatologic: No rashes or ulcers noted.  No changes consistent with cellulitis.   CBC Lab Results  Component Value Date   WBC 9.8 11/28/2023   HGB 14.7 11/28/2023   HCT 43.7 11/28/2023   MCV 95 11/28/2023   PLT 327 11/28/2023    BMET    Component Value Date/Time   NA 142 11/28/2023 0852   K 4.1 11/28/2023 0852   CL 100 11/28/2023 0852   CO2 26 11/28/2023 0852   GLUCOSE 126 (H) 11/28/2023 0852   GLUCOSE 154 (H) 06/19/2022 0133   BUN 15 11/28/2023 0852   CREATININE 0.76 11/28/2023 0852   CALCIUM 9.1 11/28/2023 0852   GFRNONAA >60 07/01/2022 0638   GFRAA 108 04/10/2020 1456   CrCl cannot be calculated (Patient's most recent lab result is older than the maximum 21 days allowed.).  COAG No results found for: "INR",  "PROTIME"  Radiology VAS [place] ABI WITH/WO TBI Result Date: 01/04/2024  LOWER EXTREMITY DOPPLER STUDY Patient Name:  Thomas Morgan  Date of Exam:   01/03/2024 Medical Rec #: [id number]  Accession #:    [id number] Date of Birth: [birth year]      Patient Gender: M Patient Age:   34 years Exam Location:  [facility] Procedure:      VAS [place] ABI WITH/WO TBI Referring Phys: --------------------------------------------------------------------------------  Indications: Rest pain.  Comparison Study: 12/2020 Performing Technologist: Salvadore Farber RVT  Examination Guidelines: A complete evaluation includes at minimum, Doppler waveform signals and systolic blood pressure reading at the level of bilateral brachial, anterior tibial, and posterior tibial arteries, when vessel segments are accessible. Bilateral testing is considered an integral part of a complete examination. Photoelectric Plethysmograph (PPG) waveforms and toe systolic pressure readings are included as required and additional duplex testing as needed. Limited examinations for reoccurring indications may be performed as noted.  ABI Findings: +---------+------------------+-----+---------+--------+ Right    Rt Pressure (mmHg)IndexWaveform Comment  +---------+------------------+-----+---------+--------+ Brachial 131                                      +---------+------------------+-----+---------+--------+ PTA      147               1.12 triphasic         +---------+------------------+-----+---------+--------+ DP       149               1.14 triphasic         +---------+------------------+-----+---------+--------+ Joie Bimler               1.40 Normal            +---------+------------------+-----+---------+--------+ +---------+------------------+-----+---------+-------+ Left     Lt Pressure (mmHg)IndexWaveform Comment +---------+------------------+-----+---------+-------+ Brachial 127                                      +---------+------------------+-----+---------+-------+ PTA      147               1.12 triphasic        +---------+------------------+-----+---------+-------+ DP       157               1.20 triphasic        +---------+------------------+-----+---------+-------+ Great Toe135               1.03 Normal           +---------+------------------+-----+---------+-------+ +-------+-----------+-----------+------------+------------+ ABI/TBIToday's ABIToday's TBIPrevious ABIPrevious TBI +-------+-----------+-----------+------------+------------+ Right  1.14       1.40       1.18        .92          +-------+-----------+-----------+------------+------------+ Left   1.20       1.03       1.20        .86          +-------+-----------+-----------+------------+------------+ Bilateral ABIs and TBIs appear essentially unchanged compared to prior study on 2022.  Summary: Right: Resting right ankle-brachial index is within normal range. The right toe-brachial index is normal. Left: Resting left ankle-brachial index is within normal range. The left toe-brachial index is normal. *See table(s) above for measurements and observations.  Electronically signed by Levora Dredge MD on 01/04/2024 at 3:57:28 PM.    Final      Assessment/Plan 1. Infrarenal abdominal aortic aneurysm (AAA) without rupture (HCC) (Primary) Recommend:  The AAA is 5 cm and therefore should undergo repair. Patient is  status post CT scan of the abdominal aorta. Base on evaluation of the CT scan by me the patient is a candidate for endovascular repair.   The patient will require cardiac clearance prior to stent graft placement.   The patient will continue antiplatelet therapy as prescribed (since the patient is undergoing endovascular repair as opposed to open repair) as well as aggressive management of hyperlipidemia. Exercise is encouraged.   The patient is reminded that lifetime routine surveillance is a necessity  with an endograft.   The risks and benefits of AAA repair are reviewed with the patient.  All questions are answered.  Alternative therapies are also discussed.  The patient agrees to proceed with endovascular aneurysm repair to prevent lethal rupture.  Patient will follow-up with me in the office after the surgery.  2. PAD (peripheral artery disease) (HCC)  Recommend:  The patient has evidence of atherosclerosis of the lower extremities with claudication.  The patient does not voice lifestyle limiting changes at this point in time.  Noninvasive studies do not suggest clinically significant change.  No invasive studies, angiography or surgery at this time The patient should continue walking and begin a more formal exercise program.  The patient should continue antiplatelet therapy and aggressive treatment of the lipid abnormalities  No changes in the patient's medications at this time  Continued surveillance is indicated as atherosclerosis is likely to progress with time.    The patient will continue follow up with noninvasive studies as ordered.   3. Coronary arteriosclerosis The patient will require cardiac clearance prior to his aneurysm repair.  I will ask Dr. And to see him.    Continue cardiac and antihypertensive medications as already ordered and reviewed, no changes at this time.  Continue statin as ordered and reviewed, no changes at this time  Nitrates PRN for chest pain  - Ambulatory referral to Cardiology  4. Primary hypertension Continue antihypertensive medications as already ordered, these medications have been reviewed and there are no changes at this time.  5. Type 2 diabetes mellitus with other circulatory complication, without long-term current use of insulin (HCC) Continue hypoglycemic medications as already ordered, these medications have been reviewed and there are no changes at this time.  Hgb A1C to be monitored as already arranged by primary  service    Levora Dredge, MD  01/24/2024 8:48 AM

## 2024-01-25 ENCOUNTER — Ambulatory Visit (INDEPENDENT_AMBULATORY_CARE_PROVIDER_SITE_OTHER): Admitting: Vascular Surgery

## 2024-01-25 ENCOUNTER — Encounter (INDEPENDENT_AMBULATORY_CARE_PROVIDER_SITE_OTHER): Payer: Self-pay | Admitting: Vascular Surgery

## 2024-01-25 ENCOUNTER — Ambulatory Visit: Payer: Medicare PPO | Admitting: Physical Therapy

## 2024-01-25 VITALS — BP 135/85 | HR 82 | Resp 16 | Wt 193.8 lb

## 2024-01-25 DIAGNOSIS — I10 Essential (primary) hypertension: Secondary | ICD-10-CM

## 2024-01-25 DIAGNOSIS — E1159 Type 2 diabetes mellitus with other circulatory complications: Secondary | ICD-10-CM

## 2024-01-25 DIAGNOSIS — I739 Peripheral vascular disease, unspecified: Secondary | ICD-10-CM

## 2024-01-25 DIAGNOSIS — I2510 Atherosclerotic heart disease of native coronary artery without angina pectoris: Secondary | ICD-10-CM | POA: Diagnosis not present

## 2024-01-25 DIAGNOSIS — I7143 Infrarenal abdominal aortic aneurysm, without rupture: Secondary | ICD-10-CM

## 2024-01-29 ENCOUNTER — Other Ambulatory Visit: Payer: Self-pay | Admitting: Acute Care

## 2024-01-29 DIAGNOSIS — Z122 Encounter for screening for malignant neoplasm of respiratory organs: Secondary | ICD-10-CM

## 2024-01-29 DIAGNOSIS — Z87891 Personal history of nicotine dependence: Secondary | ICD-10-CM

## 2024-01-29 DIAGNOSIS — F17210 Nicotine dependence, cigarettes, uncomplicated: Secondary | ICD-10-CM

## 2024-01-30 ENCOUNTER — Ambulatory Visit: Payer: Medicare PPO | Admitting: Physical Therapy

## 2024-02-01 ENCOUNTER — Encounter: Payer: Self-pay | Admitting: Cardiology

## 2024-02-01 ENCOUNTER — Ambulatory Visit: Payer: Medicare PPO | Admitting: Physical Therapy

## 2024-02-01 ENCOUNTER — Ambulatory Visit: Attending: Cardiology | Admitting: Cardiology

## 2024-02-01 VITALS — BP 136/70 | HR 72 | Ht 70.0 in | Wt 196.8 lb

## 2024-02-01 DIAGNOSIS — I10 Essential (primary) hypertension: Secondary | ICD-10-CM | POA: Diagnosis not present

## 2024-02-01 DIAGNOSIS — F17210 Nicotine dependence, cigarettes, uncomplicated: Secondary | ICD-10-CM

## 2024-02-01 DIAGNOSIS — I2510 Atherosclerotic heart disease of native coronary artery without angina pectoris: Secondary | ICD-10-CM | POA: Diagnosis not present

## 2024-02-01 DIAGNOSIS — E782 Mixed hyperlipidemia: Secondary | ICD-10-CM | POA: Diagnosis not present

## 2024-02-01 DIAGNOSIS — Z01810 Encounter for preprocedural cardiovascular examination: Secondary | ICD-10-CM

## 2024-02-01 MED ORDER — ASPIRIN 81 MG PO TBEC: 81.0000 mg | DELAYED_RELEASE_TABLET | Freq: Every day | ORAL | Status: AC

## 2024-02-01 NOTE — Patient Instructions (Signed)
 Medication Instructions:   START ASPIRIN A 81 MG ONCE DAILY  *If you need a refill on your cardiac medications before your next appointment, please call your pharmacy*  Testing/Procedures:  Your physician has requested that you have an echocardiogram. Echocardiography is a painless test that uses sound waves to create images of your heart. It provides your doctor with information about the size and shape of your heart and how well your heart's chambers and valves are working. This procedure takes approximately one hour. There are no restrictions for this procedure. Please do NOT wear cologne, perfume, aftershave, or lotions (deodorant is allowed). Please arrive 15 minutes prior to your appointment time.  Please note: We ask at that you not bring children with you during ultrasound (echo/ vascular) testing. Due to room size and safety concerns, children are not allowed in the ultrasound rooms during exams. Our front office staff cannot provide observation of children in our lobby area while testing is being conducted. An adult accompanying a patient to their appointment will only be allowed in the ultrasound room at the discretion of the ultrasound technician under special circumstances. We apologize for any inconvenience. IN HOSPITAL   Your physician has requested that you have a lexiscan myoview. For further information please visit [email or web address] Please follow instruction sheet, as given. IN HOSPITAL  Follow-Up: At [facility], you and your health needs are our priority.  As part of our continuing mission to provide you with exceptional heart care, our providers are all part of one team.  This team includes your primary Cardiologist (physician) and Advanced Practice Providers or APPs (Physician Assistants and Nurse Practitioners) who all work together to provide you with the care you need, when you need it.  Your next appointment:   2-3 month(s)  Provider:   You may see  DR  Azucena Cecil

## 2024-02-01 NOTE — Progress Notes (Signed)
 Cardiology Office Note:    Date:  02/01/2024   ID:  Thomas Morgan, DOB 1951-04-05, MRN [id number]  PCP:  Malva Limes, MD   [facility] Providers Cardiologist:  None     Referring MD: Renford Dills, MD   No chief complaint on file.   History of Present Illness:    Thomas Morgan is a 73 y.o. male with a hx of CAD (LAD, LCx calcification on chest CT 3/25 ), hypertension, hyperlipidemia, current smoker x 40+ years who presents due to CAD and preop evaluation.  Patient has an infrarenal abdominal aortic aneurysm, repair being planned by vascular surgery.  He denies chest pain or shortness of breath.  He still smokes.  Has a history of statin intolerance, tolerating current doses of lovastatin and Zetia.  Patient had a chest CT 12/2023 for lung cancer screening, coronary artery calcifications noted.  Past Medical History:  Diagnosis Date   Allergy Child   Arthritis 1970s   Basal cell carcinoma    Complication of anesthesia    GERD (gastroesophageal reflux disease)    Neuromuscular disorder (HCC) 2023   Personal history of tobacco use, presenting hazards to health 10/22/2015   PONV (postoperative nausea and vomiting)    gets very sick from anesthesia   Spinal stenosis     Past Surgical History:  Procedure Laterality Date   BACK SURGERY     BASAL CELL CARCINOMA EXCISION Left 2020   left arm   CYST REMOVAL TRUNK  2020   on his back   ELBOW SURGERY     EXCISION MORTON'S NEUROMA Left 03/24/2017   Procedure: EXCISION MORTON'S NEUROMA;  Surgeon: Linus Galas, DPM;  Location: [facility] ORS;  Service: Podiatry;  Laterality: Left;   FOOT SURGERY Right 04/16/2021   FRACTURE SURGERY  1970s   Left elbow. Right wrist   SKIN CANCER EXCISION     SPINE SURGERY     WRIST SURGERY Right    Trauma resulting in prosthetics in the right wrist and elbow    Current Medications: Current Meds  Medication Sig   aspirin EC 81 MG tablet Take 1 tablet (81 mg total) by mouth daily.  Swallow whole.   ezetimibe (ZETIA) 10 MG tablet Take 1 tablet (10 mg total) by mouth daily.   gabapentin (NEURONTIN) 300 MG capsule Take 300mg  AM, 300mg  PM, 900mg  QHS   glucose blood (ONETOUCH VERIO) test strip TEST ONCE DAILY  Dx: E11.9   hydrochlorothiazide (HYDRODIURIL) 50 MG tablet TAKE 1 TABLET BY MOUTH EVERY DAY   lovastatin (MEVACOR) 40 MG tablet Take 40 mg by mouth daily.   omeprazole (PRILOSEC) 40 MG capsule TAKE 1 CAPSULE(40 MG) BY MOUTH DAILY   OneTouch Delica Lancets 33G MISC Use to check blood sugar once a day.  DX: E11.9   traMADol (ULTRAM) 50 MG tablet Take 1-2 tablets (50-100 mg total) by mouth at bedtime as needed.     Allergies:   Hydrocodone, Lovastatin, Statins, Atorvastatin, Crestor [rosuvastatin], and Lescol  [fluvastatin]   Social History   Socioeconomic History   Marital status: Married    Spouse name: Not on file   Number of children: 2   Years of education: Not on file   Highest education level: Associate degree: occupational, Scientist, product/process development, or vocational program  Occupational History   Not on file  Tobacco Use   Smoking status: Every Day    Current packs/day: 1.00    Average packs/day: 1 pack/day for 48.0 years (48.0 ttl pk-yrs)  Types: Cigarettes   Smokeless tobacco: Never   Tobacco comments:    1 pack a day,   Vaping Use   Vaping status: Never Used  Substance and Sexual Activity   Alcohol use: No   Drug use: No   Sexual activity: Not Currently    Birth control/protection: None  Other Topics Concern   Not on file  Social History Narrative   Not on file   Social Drivers of Health   Financial Resource Strain: Low Risk  (11/24/2023)   Overall Financial Resource Strain (CARDIA)    Difficulty of Paying Living Expenses: Not very hard  Food Insecurity: No Food Insecurity (11/24/2023)   Hunger Vital Sign    Worried About Running Out of Food in the Last Year: Never true    Ran Out of Food in the Last Year: Never true  Transportation Needs: No  Transportation Needs (11/24/2023)   PRAPARE - Administrator, Civil Service (Medical): No    Lack of Transportation (Non-Medical): No  Physical Activity: Unknown (11/24/2023)   Exercise Vital Sign    Days of Exercise per Week: 0 days    Minutes of Exercise per Session: Not on file  Stress: No Stress Concern Present (11/24/2023)   [employer] of Occupational Health - Occupational Stress Questionnaire    Feeling of Stress : Not at all  Social Connections: Socially Integrated (11/24/2023)   Social Connection and Isolation Panel [NHANES]    Frequency of Communication with Friends and Family: Three times a week    Frequency of Social Gatherings with Friends and Family: More than three times a week    Attends Religious Services: More than 4 times per year    Active Member of [employer] or Organizations: Yes    Attends Engineer, structural: More than 4 times per year    Marital Status: Married     Family History: The patient's family history includes AAA (abdominal aortic aneurysm) in his father; Arthritis in his mother; Cancer in his mother; Heart Problems in his father; Heart attack in his brother; Hypercholesterolemia in his brother; Hypertension in his mother. There is no history of Neuropathy.  ROS:   Please see the history of present illness.     All other systems reviewed and are negative.  EKGs/Labs/Other Studies Reviewed:    The following studies were reviewed today:  EKG Interpretation Date/Time:  Thursday February 01 2024 10:38:46 EDT Ventricular Rate:  70 PR Interval:  200 QRS Duration:  94 QT Interval:  402 QTC Calculation: 434 R Axis:   21  Text Interpretation: Normal sinus rhythm Normal ECG Confirmed by Debbe Odea ([id number]) on 02/01/2024 11:01:49 AM    Recent Labs: 11/28/2023: ALT 17; BUN 15; Creatinine, Ser 0.76; Hemoglobin 14.7; Platelets 327; Potassium 4.1; Sodium 142  Recent Lipid Panel    Component Value Date/Time   CHOL 150 11/28/2023  0852   TRIG 175 (H) 11/28/2023 0852   HDL 27 (L) 11/28/2023 0852   CHOLHDL 5.6 (H) 11/28/2023 0852   LDLCALC 92 11/28/2023 0852     Risk Assessment/Calculations:             Physical Exam:    VS:  BP 136/70   Pulse 72   Ht 5\' 10"  (1.778 m)   Wt 196 lb 12.8 oz (89.3 kg)   SpO2 93%   BMI 28.24 kg/m     Wt Readings from Last 3 Encounters:  02/01/24 196 lb 12.8 oz (89.3 kg)  01/25/24 193  lb 12.8 oz (87.9 kg)  01/08/24 196 lb 6.4 oz (89.1 kg)     GEN:  Well nourished, well developed in no acute distress HEENT: Normal NECK: No JVD; No carotid bruits CARDIAC: RRR, no murmurs, rubs, gallops RESPIRATORY: Diminished breath sounds, no wheezing ABDOMEN: Soft, non-tender, non-distended MUSCULOSKELETAL:  No edema; No deformity  SKIN: Warm and dry NEUROLOGIC:  Alert and oriented x 3 PSYCHIATRIC:  Normal affect   ASSESSMENT:    1. Coronary artery disease involving native coronary artery of native heart, unspecified whether angina present   2. Pre-procedural cardiovascular examination   3. Primary hypertension   4. Mixed hyperlipidemia   5. Smoking greater than 40 pack years    PLAN:    In order of problems listed above:  CAD, LAD, left circumflex calcifications on chest CT 12/2023.  Obtain echocardiogram, obtain Lexiscan Myoview.  Start aspirin 81 mg, continue Zetia, lovastatin 40 mg daily. Preprocedural exam, infrarenal aortic aneurysmal repair being planned.  Cardiac eval with echo and Lexiscan Myoview as above.  Okay for procedure if no significant abnormalities noted on Myoview and echo. Hypertension, BP controlled.  Continue hydrochlorothiazide. Hyperlipidemia, statin intolerance.  Continue Mevacor 40 mg daily, Zetia 10 mg daily. Current smoker, smoking cessation strongly advised.  Follow-up in 2 to 3 months after cardiac testing      Medication Adjustments/Labs and Tests Ordered: Current medicines are reviewed at length with the patient today.  Concerns regarding  medicines are outlined above.  Orders Placed This Encounter  Procedures   NM Myocar Multi W/Spect W/Wall Motion / EF   EKG 12-Lead   ECHOCARDIOGRAM COMPLETE   Meds ordered this encounter  Medications   aspirin EC 81 MG tablet    Sig: Take 1 tablet (81 mg total) by mouth daily. Swallow whole.    Patient Instructions  Medication Instructions:   START ASPIRIN A 81 MG ONCE DAILY  *If you need a refill on your cardiac medications before your next appointment, please call your pharmacy*  Testing/Procedures:  Your physician has requested that you have an echocardiogram. Echocardiography is a painless test that uses sound waves to create images of your heart. It provides your doctor with information about the size and shape of your heart and how well your heart's chambers and valves are working. This procedure takes approximately one hour. There are no restrictions for this procedure. Please do NOT wear cologne, perfume, aftershave, or lotions (deodorant is allowed). Please arrive 15 minutes prior to your appointment time.  Please note: We ask at that you not bring children with you during ultrasound (echo/ vascular) testing. Due to room size and safety concerns, children are not allowed in the ultrasound rooms during exams. Our front office staff cannot provide observation of children in our lobby area while testing is being conducted. An adult accompanying a patient to their appointment will only be allowed in the ultrasound room at the discretion of the ultrasound technician under special circumstances. We apologize for any inconvenience. IN HOSPITAL   Your physician has requested that you have a lexiscan myoview. For further information please visit [email or web address] Please follow instruction sheet, as given. IN HOSPITAL  Follow-Up: At [facility], you and your health needs are our priority.  As part of our continuing mission to provide you with exceptional heart care, our  providers are all part of one team.  This team includes your primary Cardiologist (physician) and Advanced Practice Providers or APPs (Physician Assistants and Nurse Practitioners) who all work together  to provide you with the care you need, when you need it.  Your next appointment:   2-3 month(s)  Provider:   You may see  DR Franky Macho, MD  02/01/2024 12:51 PM    [facility]

## 2024-02-06 ENCOUNTER — Ambulatory Visit: Payer: Medicare PPO | Admitting: Physical Therapy

## 2024-02-08 ENCOUNTER — Ambulatory Visit: Payer: Medicare PPO | Admitting: Physical Therapy

## 2024-02-08 ENCOUNTER — Ambulatory Visit
Admission: RE | Admit: 2024-02-08 | Discharge: 2024-02-08 | Disposition: A | Discharge status: Home / Self Care | Source: Ambulatory Visit | Attending: Cardiology | Admitting: Cardiology

## 2024-02-08 DIAGNOSIS — I2510 Atherosclerotic heart disease of native coronary artery without angina pectoris: Secondary | ICD-10-CM | POA: Diagnosis not present

## 2024-02-08 MED ORDER — TECHNETIUM TC 99M TETROFOSMIN IV KIT
32.4200 | PACK | Freq: Once | INTRAVENOUS | Status: AC | PRN
  Administered 2024-02-08: 32.42 via INTRAVENOUS

## 2024-02-08 MED ORDER — REGADENOSON 0.4 MG/5ML IV SOLN
0.4000 mg | Freq: Once | INTRAVENOUS | Status: AC
  Administered 2024-02-08: 0.4 mg via INTRAVENOUS

## 2024-02-08 MED ORDER — TECHNETIUM TC 99M TETROFOSMIN IV KIT
10.0000 | PACK | Freq: Once | INTRAVENOUS | Status: AC | PRN
  Administered 2024-02-08: 10.38 via INTRAVENOUS

## 2024-02-09 LAB — NM MYOCAR MULTI W/SPECT W/WALL MOTION / EF
Estimated workload: 1
Exercise duration (min): 0 min
Exercise duration (sec): 0 s
LV dias vol: 107 mL (ref 62–150)
LV sys vol: 29 mL
MPHR: 147 {beats}/min
Nuc Stress EF: 73 %
Peak HR: 91 {beats}/min
Percent HR: 61 %
Rest HR: 59 {beats}/min
Rest Nuclear Isotope Dose: 10.4 mCi
SDS: 0
SRS: 6
SSS: 2
ST Depression (mm): 0 mm
Stress Nuclear Isotope Dose: 9.5 mCi
TID: 1.21

## 2024-02-13 ENCOUNTER — Ambulatory Visit: Payer: Medicare PPO | Admitting: Physical Therapy

## 2024-02-15 ENCOUNTER — Ambulatory Visit: Payer: Medicare PPO | Admitting: Physical Therapy

## 2024-02-20 ENCOUNTER — Ambulatory Visit: Payer: Medicare PPO | Admitting: Physical Therapy

## 2024-02-22 ENCOUNTER — Ambulatory Visit: Payer: Medicare PPO | Admitting: Physical Therapy

## 2024-02-22 ENCOUNTER — Telehealth (INDEPENDENT_AMBULATORY_CARE_PROVIDER_SITE_OTHER): Payer: Self-pay

## 2024-02-22 NOTE — Telephone Encounter (Signed)
 I just called and discussed, patient wishes to move forward with appeal

## 2024-02-22 NOTE — Telephone Encounter (Signed)
 Patient called stating he was to have a AAA with Dr. Prescilla Brod and has received notice from his insurance. Patient would like a call to discuss this.

## 2024-02-26 ENCOUNTER — Encounter (INDEPENDENT_AMBULATORY_CARE_PROVIDER_SITE_OTHER): Payer: Self-pay | Admitting: Nurse Practitioner

## 2024-02-27 ENCOUNTER — Ambulatory Visit: Payer: Medicare PPO | Admitting: Physical Therapy

## 2024-02-27 ENCOUNTER — Other Ambulatory Visit

## 2024-02-27 ENCOUNTER — Other Ambulatory Visit: Payer: Self-pay | Admitting: Family Medicine

## 2024-02-27 ENCOUNTER — Other Ambulatory Visit: Payer: Self-pay

## 2024-02-27 MED ORDER — OMEPRAZOLE 40 MG PO CPDR: 40.0000 mg | DELAYED_RELEASE_CAPSULE | Freq: Every day | ORAL | 4 refills | Status: AC

## 2024-02-28 ENCOUNTER — Telehealth (INDEPENDENT_AMBULATORY_CARE_PROVIDER_SITE_OTHER): Payer: Self-pay

## 2024-02-28 NOTE — Telephone Encounter (Signed)
 Patient was called to come into our office and sign paperwork that will be sent to his insurance as we attempt to appeal a decision for his surgery.

## 2024-02-29 ENCOUNTER — Ambulatory Visit: Payer: Medicare PPO | Admitting: Physical Therapy

## 2024-03-04 ENCOUNTER — Other Ambulatory Visit: Payer: Self-pay | Admitting: Family Medicine

## 2024-03-05 ENCOUNTER — Ambulatory Visit: Payer: Medicare PPO | Admitting: Physical Therapy

## 2024-03-07 ENCOUNTER — Ambulatory Visit: Payer: Medicare PPO | Admitting: Physical Therapy

## 2024-03-11 ENCOUNTER — Telehealth (INDEPENDENT_AMBULATORY_CARE_PROVIDER_SITE_OTHER): Payer: Self-pay

## 2024-03-11 NOTE — Telephone Encounter (Signed)
 Spoke with the Thomas Morgan and he is scheduled with Dr. Prescilla Brod for a AAA repair on 03/27/24 at the [facility]. Pre-admit will call for pre-op appt. Patient is to arrive to the [facility] at 6:45 am. Pre- surgical instructions were discussed and will be sent to Mychart and mailed.

## 2024-03-11 NOTE — Telephone Encounter (Signed)
 Patient called to cancel his surgery with Dr. Prescilla Brod that was scheduled for 03/27/24. Patient stated that his spouse has surgery on that same day. I advised that it may be the same turn around time as before when the insurance denied the surgery and a letter and peer to peer was required. Patient stated he understood.

## 2024-03-12 ENCOUNTER — Ambulatory Visit: Attending: Cardiology

## 2024-03-12 ENCOUNTER — Encounter (INDEPENDENT_AMBULATORY_CARE_PROVIDER_SITE_OTHER): Payer: Self-pay

## 2024-03-12 ENCOUNTER — Ambulatory Visit: Payer: Medicare PPO | Admitting: Physical Therapy

## 2024-03-12 DIAGNOSIS — I2510 Atherosclerotic heart disease of native coronary artery without angina pectoris: Secondary | ICD-10-CM | POA: Diagnosis not present

## 2024-03-12 LAB — ECHOCARDIOGRAM COMPLETE
AR max vel: 1.94 cm2
AV Area VTI: 2.18 cm2
AV Area mean vel: 2.01 cm2
AV Mean grad: 4 mmHg
AV Peak grad: 7.2 mmHg
Ao pk vel: 1.35 m/s
Area-P 1/2: 2.22 cm2
Calc EF: 65.5 %
S' Lateral: 3 cm
Single Plane A2C EF: 69.9 %
Single Plane A4C EF: 60.5 %

## 2024-03-12 NOTE — Telephone Encounter (Signed)
 Patient called back today to be rescheduled for his surgery. Patient has been rescheduled to 04/03/24 with a 6:45 am arrival time to the [facility]. Pre-surgical instructions will be sent to the patient.

## 2024-03-13 ENCOUNTER — Ambulatory Visit: Payer: Self-pay | Admitting: Cardiology

## 2024-03-14 ENCOUNTER — Ambulatory Visit: Payer: Medicare PPO | Admitting: Physical Therapy

## 2024-03-21 NOTE — Patient Instructions (Addendum)
 Your procedure is scheduled on:04-03-24 Wednesday [facility] 6:45 am   REMEMBER: Instructions that are not followed completely may result in serious medical risk, up to and including death; or upon the discretion of your surgeon and anesthesiologist your surgery may need to be rescheduled.  Do not eat food OR drink any liquids after midnight the night before surgery.  No gum chewing or hard candies.  One week prior to surgery:Last dose will be on 03-26-24  Stop Anti-inflammatories (NSAIDS) such as Advil, Aleve , Ibuprofen, Motrin, Naproxen , Naprosyn  and Aspirin  based products such as Excedrin, Goody's Powder, BC Powder. Stop ANY OVER THE COUNTER supplements until after surgery.  You may however, continue to take Tylenol /Tramadol  if needed for pain up until the day of surgery.  Continue taking all of your other prescription medications up until the day of surgery.  ON THE DAY OF SURGERY ONLY TAKE THESE MEDICATIONS WITH SIPS OF WATER: -ezetimibe  (ZETIA )  -gabapentin  (NEURONTIN )  -lovastatin  (MEVACOR )  -omeprazole  (PRILOSEC)   Continue your 81 mg Aspirin  up until the day prior to surgery-Do NOT take the morning of surgery  No Alcohol for 24 hours before or after surgery.  No Smoking including e-cigarettes for 24 hours before surgery.  No chewable tobacco products for at least 6 hours before surgery.  No nicotine patches on the day of surgery.  Do not use any "recreational" drugs for at least a week (preferably 2 weeks) before your surgery.  Please be advised that the combination of cocaine and anesthesia may have negative outcomes, up to and including death. If you test positive for cocaine, your surgery will be cancelled.  On the morning of surgery brush your teeth with toothpaste and water, you may rinse your mouth with mouthwash if you wish. Do not swallow any toothpaste or mouthwash.  Use CHG Soap as directed on instruction sheet.  Do not wear  jewelry, make-up, hairpins, clips or nail polish.  For welded (permanent) jewelry: bracelets, anklets, waist bands, etc.  Please have this removed prior to surgery.  If it is not removed, there is a chance that hospital personnel will need to cut it off on the day of surgery.  Do not wear lotions, powders, or perfumes.   Do not shave body hair from the neck down 48 hours before surgery.  Contact lenses, hearing aids and dentures may not be worn into surgery.  Do not bring valuables to the hospital. [facility] is not responsible for any missing/lost belongings or valuables.   Notify your doctor if there is any change in your medical condition (cold, fever, infection).  Wear comfortable clothing (specific to your surgery type) to the hospital.  After surgery, you can help prevent lung complications by doing breathing exercises.  Take deep breaths and cough every 1-2 hours. Your doctor may order a device called an Incentive Spirometer to help you take deep breaths. When coughing or sneezing, hold a pillow firmly against your incision with both hands. This is called "splinting." Doing this helps protect your incision. It also decreases belly discomfort.  If you are being admitted to the hospital overnight, leave your suitcase in the car. After surgery it may be brought to your room.  In case of increased patient census, it may be necessary for you, the patient, to continue your postoperative care in the Same Day Surgery department.  If you are being discharged the day of surgery, you will not be allowed to drive home. You will  need a responsible individual to drive you home and stay with you for 24 hours after surgery.   If you are taking public transportation, you will need to have a responsible individual with you.  Please call the Pre-admissions Testing Dept. at [phone] if you have any questions about these instructions.  Surgery Visitation Policy:  Patients having surgery or  a procedure may have two visitors.  Children under the age of 75 must have an adult with them who is not the patient.  Inpatient Visitation:    Visiting hours are 7 a.m. to 8 p.m. Up to four visitors are allowed at one time in a patient room. The visitors may rotate out with other people during the day.  One visitor age 15 or older may stay with the patient overnight and must be in the room by 8 p.m.     Preparing for Surgery with CHLORHEXIDINE  GLUCONATE (CHG) Soap  Chlorhexidine  Gluconate (CHG) Soap  o An antiseptic cleaner that kills germs and bonds with the skin to continue killing germs even after washing  o Used for showering the night before surgery and morning of surgery  Before surgery, you can play an important role by reducing the number of germs on your skin.  CHG (Chlorhexidine  gluconate) soap is an antiseptic cleanser which kills germs and bonds with the skin to continue killing germs even after washing.  Please do not use if you have an allergy to CHG or antibacterial soaps. If your skin becomes reddened/irritated stop using the CHG.  1. Shower the NIGHT BEFORE SURGERY and the MORNING OF SURGERY with CHG soap.  2. If you choose to wash your hair, wash your hair first as usual with your normal shampoo.  3. After shampooing, rinse your hair and body thoroughly to remove the shampoo.  4. Use CHG as you would any other liquid soap. You can apply CHG directly to the skin and wash gently with a scrungie or a clean washcloth.  5. Apply the CHG soap to your body only from the neck down. Do not use on open wounds or open sores. Avoid contact with your eyes, ears, mouth, and genitals (private parts). Wash face and genitals (private parts) with your normal soap.  6. Wash thoroughly, paying special attention to the area where your surgery will be performed.  7. Thoroughly rinse your body with warm water.  8. Do not shower/wash with your normal soap after using and rinsing off  the CHG soap.  9. Pat yourself dry with a clean towel.  10. Wear clean pajamas to bed the night before surgery.  12. Place clean sheets on your bed the night of your first shower and do not sleep with pets.  13. Shower again with the CHG soap on the day of surgery prior to arriving at the hospital.  14. Do not apply any deodorants/lotions/powders.  15. Please wear clean clothes to the hospital.

## 2024-03-22 ENCOUNTER — Other Ambulatory Visit: Payer: Self-pay

## 2024-03-22 ENCOUNTER — Encounter
Admission: RE | Admit: 2024-03-22 | Discharge: 2024-03-22 | Disposition: A | Discharge status: Home / Self Care | Source: Ambulatory Visit | Attending: Vascular Surgery | Admitting: Vascular Surgery

## 2024-03-22 ENCOUNTER — Other Ambulatory Visit (INDEPENDENT_AMBULATORY_CARE_PROVIDER_SITE_OTHER): Payer: Self-pay | Admitting: Nurse Practitioner

## 2024-03-22 VITALS — BP 142/81 | HR 61 | Resp 12 | Ht 70.5 in | Wt 196.7 lb

## 2024-03-22 DIAGNOSIS — I7143 Infrarenal abdominal aortic aneurysm, without rupture: Secondary | ICD-10-CM | POA: Insufficient documentation

## 2024-03-22 DIAGNOSIS — Z01818 Encounter for other preprocedural examination: Secondary | ICD-10-CM

## 2024-03-22 DIAGNOSIS — E119 Type 2 diabetes mellitus without complications: Secondary | ICD-10-CM | POA: Insufficient documentation

## 2024-03-22 DIAGNOSIS — Z01812 Encounter for preprocedural laboratory examination: Secondary | ICD-10-CM | POA: Insufficient documentation

## 2024-03-22 HISTORY — DX: Emphysema, unspecified: J43.9

## 2024-03-22 HISTORY — DX: Personal history of adenomatous and serrated colon polyps: Z86.0101

## 2024-03-22 HISTORY — DX: Abdominal aortic aneurysm, without rupture, unspecified: I71.40

## 2024-03-22 HISTORY — DX: Psoriasis, unspecified: L40.9

## 2024-03-22 HISTORY — DX: Vitamin D deficiency, unspecified: E55.9

## 2024-03-22 HISTORY — DX: Male erectile dysfunction, unspecified: N52.9

## 2024-03-22 HISTORY — DX: Tobacco use: Z72.0

## 2024-03-22 HISTORY — DX: Atherosclerosis of aorta: I70.0

## 2024-03-22 HISTORY — DX: Ventral hernia without obstruction or gangrene: K43.9

## 2024-03-22 HISTORY — DX: Type 2 diabetes mellitus without complications: E11.9

## 2024-03-22 HISTORY — DX: Fatty (change of) liver, not elsewhere classified: K76.0

## 2024-03-22 HISTORY — DX: Spinal stenosis, lumbar region without neurogenic claudication: M48.061

## 2024-03-22 HISTORY — DX: Peripheral vascular disease, unspecified: I73.9

## 2024-03-22 HISTORY — DX: Infrarenal abdominal aortic aneurysm, without rupture: I71.43

## 2024-03-22 LAB — TYPE AND SCREEN
ABO/RH(D): A POS
Antibody Screen: NEGATIVE

## 2024-03-22 LAB — CBC WITH DIFFERENTIAL/PLATELET
Abs Immature Granulocytes: 0.03 10*3/uL (ref 0.00–0.07)
Basophils Absolute: 0.1 10*3/uL (ref 0.0–0.1)
Basophils Relative: 1 %
Eosinophils Absolute: 0.6 10*3/uL — ABNORMAL HIGH (ref 0.0–0.5)
Eosinophils Relative: 6 %
HCT: 41.9 % (ref 39.0–52.0)
Hemoglobin: 14.5 g/dL (ref 13.0–17.0)
Immature Granulocytes: 0 %
Lymphocytes Relative: 20 %
Lymphs Abs: 2 10*3/uL (ref 0.7–4.0)
MCH: 32 pg (ref 26.0–34.0)
MCHC: 34.6 g/dL (ref 30.0–36.0)
MCV: 92.5 fL (ref 80.0–100.0)
Monocytes Absolute: 0.6 10*3/uL (ref 0.1–1.0)
Monocytes Relative: 6 %
Neutro Abs: 6.6 10*3/uL (ref 1.7–7.7)
Neutrophils Relative %: 67 %
Platelets: 324 10*3/uL (ref 150–400)
RBC: 4.53 MIL/uL (ref 4.22–5.81)
RDW: 12.9 % (ref 11.5–15.5)
WBC: 9.8 10*3/uL (ref 4.0–10.5)
nRBC: 0 % (ref 0.0–0.2)

## 2024-03-22 LAB — BASIC METABOLIC PANEL WITH GFR
Anion gap: 9 (ref 5–15)
BUN: 14 mg/dL (ref 8–23)
CO2: 29 mmol/L (ref 22–32)
Calcium: 8.9 mg/dL (ref 8.9–10.3)
Chloride: 102 mmol/L (ref 98–111)
Creatinine, Ser: 0.62 mg/dL (ref 0.61–1.24)
GFR, Estimated: >60 mL/min (ref >60)
Glucose, Bld: 123 mg/dL — ABNORMAL HIGH (ref 70–99)
Potassium: 3.4 mmol/L — ABNORMAL LOW (ref 3.5–5.1)
Sodium: 140 mmol/L (ref 135–145)

## 2024-03-22 LAB — HEMOGLOBIN A1C
Hgb A1c MFr Bld: 7 % — ABNORMAL HIGH (ref 4.8–5.6)
Mean Plasma Glucose: 154.2 mg/dL

## 2024-03-22 LAB — SURGICAL PCR SCREEN
MRSA, PCR: NEGATIVE
Staphylococcus aureus: NEGATIVE

## 2024-03-27 ENCOUNTER — Inpatient Hospital Stay: Admit: 2024-03-27 | Admitting: Vascular Surgery

## 2024-03-27 SURGERY — ENDOVASCULAR REPAIR/STENT GRAFT
Anesthesia: General

## 2024-04-02 ENCOUNTER — Encounter: Payer: Self-pay | Admitting: Vascular Surgery

## 2024-04-02 NOTE — Progress Notes (Signed)
 Perioperative / Anesthesia Services  Pre-Admission Testing Clinical Review / Pre-Operative Anesthesia Consult  Date: 04/02/24  PATIENT DEMOGRAPHICS: Name: Thomas Morgan DOB: [birth year] MRN:   [id number]  Note: Available PAT nursing documentation and vital signs have been reviewed. Clinical nursing staff has updated patient's PMH/PSHx, current medication list, and drug allergies/intolerances to ensure complete and comprehensive history available to assist care teams in MDM as it pertains to the aforementioned surgical procedure and anticipated anesthetic course. Extensive review of available clinical information personally performed. [place] [facility] and PSHx updated with any diagnoses/procedures that  may have been inadvertently omitted during his intake with the pre-admission testing department's nursing staff.  PLANNED SURGICAL PROCEDURE(S):    Case: [id number] Date/Time: 04/03/24 0800   Procedure: ENDOVASCULAR REPAIR/STENT GRAFT   Anesthesia type: General   Diagnosis: Abdominal aortic aneurysm (AAA) without rupture, unspecified part (HCC) [I71.40]   Pre-op diagnosis: AAA   GORE   ANESTHESIA    AAA repair   Location: [facility] / [facility]   Providers: Prescilla Brod Ninette Basque, MD     CLINICAL DISCUSSION: Thomas Morgan is a 73 y.o. male who is submitted for pre-surgical anesthesia review and clearance prior to him undergoing the above procedure. Patient is a Current Smoker (48 pack years). Pertinent PMH includes: CAD, diastolic dysfunction, infrarenal AAA, PAD, aortic atherosclerosis, HTN, HLD, T2DM, emphysema, GERD (on daily PPI), OA, lumbar spinal stenosis.  Patient is followed by cardiology Junnie Olives, MD). He was last seen in the cardiology clinic on 02/01/2024; notes reviewed. At the time of his clinic visit, patient doing well overall from a cardiovascular perspective. Patient denied any chest pain, shortness of breath, PND, orthopnea, palpitations, significant peripheral  edema, weakness, fatigue, vertiginous symptoms, or presyncope/syncope. Patient with a past medical history significant for cardiovascular diagnoses. Documented physical exam was grossly benign, providing no evidence of acute exacerbation and/or decompensation of the patient's known cardiovascular conditions.  Patient with known infrarenal AAA.  Most recent aortic duplex performed on 12/13/2023 revealed interval increase in size of aneurysmal defect of 5.1 cm (previously 3.9 cm).  Blood pressure reasonably controlled at 136/70 mmHg on currently prescribed diuretic (HCTZ) monotherapy.  Will check patient is on ezetimibe  + lovastatin  for his HLD diagnosis and ASCVD prevention. T2DM loosely controlled on currently prescribed regimen; last HgbA1c was 7.4% when checked on 11/28/2023.  Of note, since patient was last seen by cardiology, his A1c level has been rechecked with further improvement down to 7.0% on 03/22/2024. Patient does not have an OSAH diagnosis. Patient is able to complete all of his  ADL/IADLs without cardiovascular limitation.  Per the DASI, patient is able to achieve at least 4 METS of physical activity without experiencing any significant degree of angina/anginal equivalent symptoms.  No changes were made to his medication regimen.  In the setting of upcoming vascular surgery procedure, further noninvasive testing was ordered to help risk stratify patient prior to upcoming procedure.  Patient to follow-up with outpatient cardiology in 2 to 3 months or sooner if needed.  Since patient was last seen by cardiology, he has undergone the recommended noninvasive cardiovascular testing.  Most recent myocardial perfusion imaging study was performed on 02/08/2024 revealing a mildly reduced normal left ventricular systolic function with an EF of 45%.  Of note, EF felt to be possibly reduced secondary to GI uptake artifact.  There were no regional wall motion abnormalities.  No left ventricular cavity size  enlargement appreciated on review of imaging. SPECT images demonstrated no  evidence of stress-induced myocardial ischemia or arrhythmia; no scintigraphic evidence of scar.  CT attenuation correction images demonstrated mild three-vessel coronary artery calcifications and aortic atherosclerosis.  TID ratio = 1.21. Study determined to be normal and low risk.  Most recent TTE performed on 3 03/12/2024 revealed a normal left ventricular systolic function with an EF of 55-60%.  It There were no regional wall motion abnormalities. Left ventricular diastolic Doppler parameters consistent with abnormal relaxation (G1DD). Right ventricular size and function normal with a TAPSE measuring 2.7 cm  (normal range >/= 1.6 cm).  There was no significant valvular regurgitation. All transvalvular gradients were noted to be normal providing no evidence suggestive of valvular stenosis. Aorta normal in size with no evidence of ectasia or aneurysmal dilatation.  Thomas Morgan is scheduled for an elective ENDOVASCULAR REPAIR/STENT GRAFT on 04/03/2024 with Dr. Devon Fogo, MD.  Given patient's past medical history significant for cardiovascular diagnoses, presurgical cardiac clearance was sought by the PAT team. Per cardiology, "based ACC/AHA guidelines, the patient's past medical history, and the amount of time since his last clinic visit, this patient would be at an overall ACCEPTABLE risk for the planned procedure without further cardiovascular testing or intervention at this time".   In review of the patient's chart, it is noted that he is on daily oral antithrombotic therapy. Given that patient's past medical history is significant for cardiovascular diagnoses, including but not limited to CAD, vascular surgery has cleared patient to continue his daily low dose ASA throughout his perioperative course.  Patient has been updated on these directives from his specialty care providers by the PAT team.  Patient reports previous  perioperative complications with anesthesia in the past. Patient has a PMH (+) for PONV. Symptoms and history of PONV will be discussed with patient by anesthesia team on the day of her procedure. Interventions will be ordered as deemed necessary based on patient's individual care needs as determined by anesthesiologist. In review his EMR, it is noted that patient underwent a general anesthetic course here at [facility] ([facility]) in 03/2017 without documented complications.   MOST RECENT VITAL SIGNS:    03/22/2024    8:30 AM 02/01/2024   10:31 AM 01/25/2024    8:53 AM  Vitals with BMI  Height 5' 10.5" 5\' 10"    Weight 196 lbs 11 oz 196 lbs 13 oz 193 lbs 13 oz  BMI 27.82 28.24 27.81  Systolic 142 136 621  Diastolic 81 70 85  Pulse 61 72 82   PROVIDERS/SPECIALISTS: NOTE: Primary physician provider listed below. Patient may have been seen by APP or partner within same practice.   PROVIDER ROLE / SPECIALTY LAST OV  Schnier, Ninette Basque, MD Vascular Surgery (Surgeon) 01/25/2024  Lamon Pillow, MD Primary Care Provider 12/15/2023  Constancia Delton, MD Cardiology 02/01/2024   ALLERGIES: Allergies  Allergen Reactions   Hydrocodone Other (See Comments)    Palpitations, increased heart rate   Statins    Atorvastatin      Aching and feeling down   Crestor [Rosuvastatin] Other (See Comments)    Muscle aches   Lescol  [Fluvastatin]     Muscle Aches   CURRENT HOME MEDICATIONS: No current facility-administered medications for this encounter.    Ascorbic Acid (VITAMIN C PO)   aspirin  EC 81 MG tablet   Cholecalciferol (VITAMIN D -3 PO)   ezetimibe  (ZETIA ) 10 MG tablet   gabapentin  (NEURONTIN ) 300 MG capsule   glucose blood (ONETOUCH VERIO) test strip  hydrochlorothiazide  (HYDRODIURIL ) 50 MG tablet   lovastatin  (MEVACOR ) 40 MG tablet   omeprazole  (PRILOSEC) 40 MG capsule   OneTouch Delica Lancets 33G MISC   traMADol  (ULTRAM ) 50 MG tablet    HISTORY: Past Medical History:  Diagnosis Date   Allergy    Arthritis    Atherosclerosis of aorta (HCC)    Basal cell carcinoma    CAD (coronary artery disease)    Complication of anesthesia    a.) PONV   Diastolic dysfunction    Diverticulosis    DM (diabetes mellitus), type 2 (HCC)    ED (erectile dysfunction)    Emphysema of lung (HCC)    GERD (gastroesophageal reflux disease)    Hepatic steatosis    History of adenomatous polyp of colon    HTN (hypertension)    Hyperlipidemia    Infrarenal abdominal aortic aneurysm (AAA) without rupture (HCC)    Long-term use of aspirin  therapy    Lumbar canal stenosis    PAD (peripheral artery disease) (HCC)    PONV (postoperative nausea and vomiting)    Psoriasis    Tobacco abuse    Ventral hernia    Vitamin D  deficiency    Past Surgical History:  Procedure Laterality Date   BACK SURGERY     BASAL CELL CARCINOMA EXCISION Left 2020   left arm   CYST REMOVAL TRUNK  2020   on his back   ELBOW SURGERY Left    EXCISION MORTON'S NEUROMA Left 03/24/2017   Procedure: EXCISION MORTON'S NEUROMA;  Surgeon: Angel Barba, DPM;  Location: [facility] ORS;  Service: Podiatry;  Laterality: Left;   FOOT SURGERY Right 04/16/2021   FRACTURE SURGERY  1970s   Left elbow. Right wrist   SKIN CANCER EXCISION     SPINE SURGERY     WRIST SURGERY Right    Trauma resulting in prosthetics in the right wrist and elbow   Family History  Problem Relation Age of Onset   Cancer Mother        Lung   Hypertension Mother    Arthritis Mother    Heart Problems Father    AAA (abdominal aortic aneurysm) Father    Heart attack Brother    Hypercholesterolemia Brother    Neuropathy Neg Hx    Social History   Tobacco Use   Smoking status: Every Day    Current packs/day: 1.00    Average packs/day: 1 pack/day for 48.0 years (48.0 ttl pk-yrs)    Types: Cigarettes   Smokeless tobacco: Never   Tobacco comments:    1 pack a day,   Substance Use Topics   Alcohol  use: No   LABS:  Lab Results  Component Value Date   WBC 9.8 03/22/2024   HGB 14.5 03/22/2024   HCT 41.9 03/22/2024   MCV 92.5 03/22/2024   PLT 324 03/22/2024   Lab Results  Component Value Date   NA 140 03/22/2024   CL 102 03/22/2024   K 3.4 (L) 03/22/2024   CO2 29 03/22/2024   BUN 14 03/22/2024   CREATININE 0.62 03/22/2024   GFRNONAA >60 03/22/2024   CALCIUM  8.9 03/22/2024   PHOS 3.5 05/08/2015   ALBUMIN 4.1 11/28/2023   GLUCOSE 123 (H) 03/22/2024   Lab Results  Component Value Date   HGBA1C 7.0 (H) 03/22/2024    ECG: Date: 02/01/2024  Time ECG obtained: 1038 AM Rate: 70 bpm Rhythm: normal sinus Axis (leads I and aVF): normal Intervals: PR 200 ms. QRS 94 ms. QTc 434  ms. ST segment and T wave changes: No evidence of acute T wave abnormalities or significant ST segment elevation or depression.  Evidence of a possible, age undetermined, prior infarct:  No Comparison: Similar to previous tracing obtained on 10/28/2022   IMAGING / PROCEDURES: TRANSTHORACIC ECHOCARDIOGRAM performed on 03/12/2024 Left ventricular ejection fraction, by estimation, is 55 to 60%. The left ventricle has normal function. The left ventricle has no regional wall motion abnormalities. Left ventricular diastolic parameters are consistent with Grade I diastolic dysfunction (impaired relaxation).  Right ventricular systolic function is normal. The right ventricular size is normal.  The mitral valve is normal in structure. No evidence of mitral valve regurgitation. No evidence of mitral stenosis.  The aortic valve is normal in structure. Aortic valve regurgitation is not visualized. No aortic stenosis is present.  The inferior vena cava is normal in size with greater than 50% respiratory variability, suggesting right atrial pressure of 3 mmHg.   MYOCARDIAL PERFUSION IMAGING STUDY (LEXISCAN ) performed on 02/08/2024 EF estimated at 45% (possibly reduced secondary to GI uptake artifact) Normal wall  motion No EKG changes concerning for ischemia at peak stress or in recovery. CT attenuation correction images detail mild three-vessel coronary calcification, mild aortic atherosclerosis No evidence of significant myocardial ischemia Low risk scan  CT ANGIO ABD/PEL W/ AND/OR W/O performed on 01/12/2024 Infrarenal abdominal aortic aneurysm reaching maximum transverse dimensions of approximately 4.6 x 4.6 cm with maximum oblique diameter of approximately 4.8 cm. Infrarenal aneurysm neck below the origin of the left renal artery measures approximately 2.6 x 2.9 cm and contains some irregular mural thrombus/soft plaque. Length of the infrarenal neck is approximately 2.8-2.9 cm. The aneurysm extends to the bifurcation. No evidence of aneurysm rupture. Tortuous bilateral iliac arteries without significant obstructive disease. Maximum caliber of the right common iliac artery distally is 1.8 cm. Maximum caliber of the left common iliac artery is 1.4 cm. Hepatic steatosis. Diverticulosis of sigmoid and descending colon without evidence of diverticulitis.  VAS US  ABI WITH/WO TBI performed on 01/03/2024 Resting right ankle-brachial index is within normal range. The right toe-brachial index is normal.  Resting left ankle-brachial index is within normal range. The left toe-brachial index is normal.    CT CHEST LUNG CA SCREEN LOW DOSE W/O CM performed on 01/01/2024 Lung-RADS 2, benign appearance or behavior. Continue annual screening with low-dose chest CT without contrast in 12 months. Three-vessel coronary atherosclerosis. Aortic atherosclerosis  Emphysema    US  AORTA DUPLEX LIMITED performed on 12/13/2023 5.1 cm infrarenal abdominal aortic aneurysm which is enlarged compared to prior exam. Recommend follow-up CT or MR as appropriate in 6 months and referral to or continued care with vascular specialist. (Ref.: J Vasc Surg. 2018; 67:2-77 and J Am Coll Radiol 2013;10(10):789-794.)   MR LUMBAR SPINE W WO  CONTRAST performed on 08/02/2022 Compared with previous MRI from 06/20/2022, there is an enlarging posterior paraspinal fluid collection at L3-4 with associated peripheral enhancement which could reflect a CSF leak or infected postoperative seroma. There is new periarticular enhancement associated with the right-sided facet joint at L3-4 which could be a source of right-sided back pain. No suspicious dural enhancement. No evidence of discitis or osteomyelitis. The additional lumbar levels appear unchanged. No acute osseous findings. Chronic moderate to severe left greater than right foraminal narrowing at L5-S1 secondary to chronic pars defects and anterolisthesis. Chronic moderate left foraminal narrowing at L4-5. 4.1 cm abdominal aortic aneurysm. Recommend follow-up every 12 months and vascular consultation. Reference: J Am Coll Radiol 2013;10:789-794.  IMPRESSION AND PLAN: Thomas Morgan has been referred for pre-anesthesia review and clearance prior to him undergoing the planned anesthetic and procedural courses. Available labs, pertinent testing, and imaging results were personally reviewed by me in preparation for upcoming operative/procedural course. [facility] medical record has been updated following extensive record review and patient interview with PAT staff.   This patient has been appropriately cleared by cardiology with an overall ACCEPTABLE risk of patient experiencing significant perioperative cardiovascular complications. Based on clinical review performed today (04/02/24), barring any significant acute changes in the patient's overall condition, it is anticipated that he will be able to proceed with the planned surgical intervention. Any acute changes in clinical condition may necessitate his procedure being postponed and/or cancelled. Patient will meet with anesthesia team (MD and/or CRNA) on the day of his procedure for preoperative evaluation/assessment. Questions regarding  anesthetic course will be fielded at that time.   Pre-surgical instructions were reviewed with the patient during his PAT appointment, and questions were fielded to satisfaction by PAT clinical staff. He has been instructed on which medications that he will need to hold prior to surgery, as well as the ones that have been deemed safe/appropriate to take on the day of his procedure. As part of the general education provided by PAT, patient made aware both verbally and in writing, that he would need to abstain from the use of any illegal substances during his perioperative course. He was advised that failure to follow the provided instructions could necessitate case cancellation or result in serious perioperative complications up to and including death. Patient encouraged to contact PAT and/or his surgeon's office to discuss any questions or concerns that may arise prior to surgery; verbalized understanding.   Renate Caroline, MSN, APRN, FNP-C, CEN [facility]  Perioperative Services Nurse Practitioner Phone: [phone] Fax: [phone] 04/02/24 1:12 PM  NOTE: This note has been prepared using Scientist, clinical (histocompatibility and immunogenetics). Despite my best ability to proofread, there is always the potential that unintentional transcriptional errors may still occur from this process.

## 2024-04-03 ENCOUNTER — Inpatient Hospital Stay: Payer: Self-pay | Admitting: Urgent Care

## 2024-04-03 ENCOUNTER — Other Ambulatory Visit: Payer: Self-pay

## 2024-04-03 ENCOUNTER — Encounter
Admission: RE | Disposition: A | Discharge status: Home / Self Care | Payer: Self-pay | Source: Home / Self Care | Attending: Vascular Surgery

## 2024-04-03 ENCOUNTER — Inpatient Hospital Stay
Admission: RE | Admit: 2024-04-03 | Discharge: 2024-04-04 | DRG: 269 | Disposition: A | Discharge status: Home / Self Care | Attending: Vascular Surgery | Admitting: Vascular Surgery

## 2024-04-03 DIAGNOSIS — I10 Essential (primary) hypertension: Secondary | ICD-10-CM | POA: Diagnosis not present

## 2024-04-03 DIAGNOSIS — I7143 Infrarenal abdominal aortic aneurysm, without rupture: Secondary | ICD-10-CM | POA: Diagnosis not present

## 2024-04-03 DIAGNOSIS — Z8261 Family history of arthritis: Secondary | ICD-10-CM

## 2024-04-03 DIAGNOSIS — I714 Abdominal aortic aneurysm, without rupture, unspecified: Principal | ICD-10-CM

## 2024-04-03 DIAGNOSIS — Z83438 Family history of other disorder of lipoprotein metabolism and other lipidemia: Secondary | ICD-10-CM | POA: Diagnosis not present

## 2024-04-03 DIAGNOSIS — I2510 Atherosclerotic heart disease of native coronary artery without angina pectoris: Secondary | ICD-10-CM | POA: Diagnosis present

## 2024-04-03 DIAGNOSIS — Z01818 Encounter for other preprocedural examination: Secondary | ICD-10-CM

## 2024-04-03 DIAGNOSIS — Z85828 Personal history of other malignant neoplasm of skin: Secondary | ICD-10-CM

## 2024-04-03 DIAGNOSIS — F17210 Nicotine dependence, cigarettes, uncomplicated: Secondary | ICD-10-CM | POA: Diagnosis not present

## 2024-04-03 DIAGNOSIS — Z8249 Family history of ischemic heart disease and other diseases of the circulatory system: Secondary | ICD-10-CM

## 2024-04-03 DIAGNOSIS — Z7902 Long term (current) use of antithrombotics/antiplatelets: Secondary | ICD-10-CM | POA: Diagnosis not present

## 2024-04-03 DIAGNOSIS — E1151 Type 2 diabetes mellitus with diabetic peripheral angiopathy without gangrene: Secondary | ICD-10-CM | POA: Diagnosis not present

## 2024-04-03 DIAGNOSIS — Z7982 Long term (current) use of aspirin: Secondary | ICD-10-CM

## 2024-04-03 DIAGNOSIS — I719 Aortic aneurysm of unspecified site, without rupture: Secondary | ICD-10-CM | POA: Diagnosis present

## 2024-04-03 HISTORY — DX: Atherosclerotic heart disease of native coronary artery without angina pectoris: I25.10

## 2024-04-03 HISTORY — DX: Long term (current) use of aspirin: Z79.82

## 2024-04-03 HISTORY — DX: Diverticulosis of intestine, part unspecified, without perforation or abscess without bleeding: K57.90

## 2024-04-03 HISTORY — DX: Essential (primary) hypertension: I10

## 2024-04-03 HISTORY — DX: Other ill-defined heart diseases: I51.89

## 2024-04-03 LAB — BASIC METABOLIC PANEL WITH GFR
Anion gap: 5 (ref 5–15)
BUN: 13 mg/dL (ref 8–23)
CO2: 25 mmol/L (ref 22–32)
Calcium: 7.5 mg/dL — ABNORMAL LOW (ref 8.9–10.3)
Chloride: 112 mmol/L — ABNORMAL HIGH (ref 98–111)
Creatinine, Ser: 0.68 mg/dL (ref 0.61–1.24)
GFR, Estimated: >60 mL/min (ref >60)
Glucose, Bld: 160 mg/dL — ABNORMAL HIGH (ref 70–99)
Potassium: 3.8 mmol/L (ref 3.5–5.1)
Sodium: 142 mmol/L (ref 135–145)

## 2024-04-03 LAB — CBC
HCT: 31.1 % — ABNORMAL LOW (ref 39.0–52.0)
Hemoglobin: 10.2 g/dL — ABNORMAL LOW (ref 13.0–17.0)
MCH: 31.3 pg (ref 26.0–34.0)
MCHC: 32.8 g/dL (ref 30.0–36.0)
MCV: 95.4 fL (ref 80.0–100.0)
Platelets: 238 10*3/uL (ref 150–400)
RBC: 3.26 MIL/uL — ABNORMAL LOW (ref 4.22–5.81)
RDW: 12.9 % (ref 11.5–15.5)
WBC: 10.6 10*3/uL — ABNORMAL HIGH (ref 4.0–10.5)
nRBC: 0 % (ref 0.0–0.2)

## 2024-04-03 LAB — PROTIME-INR
INR: 1.2 (ref 0.8–1.2)
Prothrombin Time: 15.4 s — ABNORMAL HIGH (ref 11.4–15.2)

## 2024-04-03 LAB — ABO/RH: ABO/RH(D): A POS

## 2024-04-03 LAB — GLUCOSE, CAPILLARY
Glucose-Capillary: 124 mg/dL — ABNORMAL HIGH (ref 70–99)
Glucose-Capillary: 167 mg/dL — ABNORMAL HIGH (ref 70–99)
Glucose-Capillary: 146 mg/dL — ABNORMAL HIGH (ref 70–99)
Glucose-Capillary: 201 mg/dL — ABNORMAL HIGH (ref 70–99)
Glucose-Capillary: 190 mg/dL — ABNORMAL HIGH (ref 70–99)

## 2024-04-03 LAB — MRSA NEXT GEN BY PCR, NASAL: MRSA by PCR Next Gen: NOT DETECTED

## 2024-04-03 LAB — APTT: aPTT: 56 s — ABNORMAL HIGH (ref 24–36)

## 2024-04-03 LAB — MAGNESIUM: Magnesium: 1.9 mg/dL (ref 1.7–2.4)

## 2024-04-03 SURGERY — ENDOVASCULAR STENT GRAFT (AAA)
Anesthesia: General

## 2024-04-03 MED ORDER — ACETAMINOPHEN 325 MG PO TABS: 325.0000 mg | ORAL_TABLET | ORAL | Status: DC | PRN

## 2024-04-03 MED ORDER — LABETALOL HCL 5 MG/ML IV SOLN: 10.0000 mg | INTRAVENOUS | Status: DC | PRN

## 2024-04-03 MED ORDER — ONDANSETRON HCL 4 MG/2ML IJ SOLN: 4.0000 mg | Freq: Once | INTRAMUSCULAR | Status: DC | PRN

## 2024-04-03 MED ORDER — GUAIFENESIN-DM 100-10 MG/5ML PO SYRP: 15.0000 mL | ORAL_SOLUTION | ORAL | Status: DC | PRN

## 2024-04-03 MED ORDER — ROCURONIUM BROMIDE 100 MG/10ML IV SOLN
INTRAVENOUS | Status: DC | PRN
  Administered 2024-04-03: 60 mg via INTRAVENOUS

## 2024-04-03 MED ORDER — DIPHENHYDRAMINE HCL 50 MG/ML IJ SOLN
INTRAMUSCULAR | Status: DC | PRN
  Administered 2024-04-03: 12.5 mg via INTRAVENOUS

## 2024-04-03 MED ORDER — CHLORHEXIDINE GLUCONATE 0.12 % MT SOLN
15.0000 mL | Freq: Once | OROMUCOSAL | Status: AC
Start: 2024-04-03 — End: 2024-04-03
  Administered 2024-04-03: 15 mL via OROMUCOSAL
  Filled 2024-04-03: qty 15

## 2024-04-03 MED ORDER — ASPIRIN 81 MG PO TBEC
81.0000 mg | DELAYED_RELEASE_TABLET | Freq: Every day | ORAL | Status: DC
  Administered 2024-04-03 – 2024-04-04 (×2): 81 mg via ORAL
  Filled 2024-04-03 (×2): qty 1

## 2024-04-03 MED ORDER — OXYCODONE HCL 5 MG/5ML PO SOLN: 5.0000 mg | Freq: Once | ORAL | Status: DC | PRN

## 2024-04-03 MED ORDER — PROPOFOL 10 MG/ML IV BOLUS
INTRAVENOUS | Status: AC
  Filled 2024-04-03: qty 20

## 2024-04-03 MED ORDER — MORPHINE SULFATE (PF) 2 MG/ML IV SOLN: 2.0000 mg | INTRAVENOUS | Status: DC | PRN

## 2024-04-03 MED ORDER — METOPROLOL TARTRATE 5 MG/5ML IV SOLN: 2.0000 mg | INTRAVENOUS | Status: DC | PRN

## 2024-04-03 MED ORDER — ACETAMINOPHEN 650 MG RE SUPP: 325.0000 mg | RECTAL | Status: DC | PRN

## 2024-04-03 MED ORDER — MIDAZOLAM HCL 2 MG/ML PO SYRP: 8.0000 mg | ORAL_SOLUTION | Freq: Once | ORAL | Status: DC | PRN

## 2024-04-03 MED ORDER — FENTANYL CITRATE (PF) 100 MCG/2ML IJ SOLN
INTRAMUSCULAR | Status: DC | PRN
  Administered 2024-04-03 (×3): 50 ug via INTRAVENOUS

## 2024-04-03 MED ORDER — MAGNESIUM CITRATE PO SOLN: 1.0000 | Freq: Once | ORAL | Status: DC | PRN

## 2024-04-03 MED ORDER — CEFAZOLIN SODIUM-DEXTROSE 2-4 GM/100ML-% IV SOLN
2.0000 g | Freq: Three times a day (TID) | INTRAVENOUS | Status: AC
  Administered 2024-04-03 – 2024-04-04 (×2): 2 g via INTRAVENOUS
  Filled 2024-04-03 (×2): qty 100

## 2024-04-03 MED ORDER — CHLORHEXIDINE GLUCONATE CLOTH 2 % EX PADS
6.0000 | MEDICATED_PAD | Freq: Every day | CUTANEOUS | Status: DC
Start: 2024-04-04 — End: 2024-04-04
  Administered 2024-04-04: 6 via TOPICAL

## 2024-04-03 MED ORDER — PROPOFOL 10 MG/ML IV BOLUS
INTRAVENOUS | Status: DC | PRN
  Administered 2024-04-03 (×2): 20 mg via INTRAVENOUS
  Administered 2024-04-03: 160 mg via INTRAVENOUS

## 2024-04-03 MED ORDER — GABAPENTIN 300 MG PO CAPS
300.0000 mg | ORAL_CAPSULE | Freq: Every day | ORAL | Status: DC
  Administered 2024-04-04: 300 mg via ORAL
  Filled 2024-04-03: qty 1

## 2024-04-03 MED ORDER — SCOPOLAMINE 1 MG/3DAYS TD PT72
1.0000 | MEDICATED_PATCH | TRANSDERMAL | Status: DC
  Administered 2024-04-03: 1.5 mg via TRANSDERMAL
  Filled 2024-04-03: qty 1

## 2024-04-03 MED ORDER — FENTANYL CITRATE (PF) 100 MCG/2ML IJ SOLN
INTRAMUSCULAR | Status: AC
Start: 2024-04-03 — End: 2024-04-03
  Filled 2024-04-03: qty 2

## 2024-04-03 MED ORDER — OXYCODONE HCL 5 MG PO TABS: 5.0000 mg | ORAL_TABLET | Freq: Once | ORAL | Status: DC | PRN

## 2024-04-03 MED ORDER — POTASSIUM CHLORIDE CRYS ER 20 MEQ PO TBCR: 20.0000 meq | EXTENDED_RELEASE_TABLET | Freq: Every day | ORAL | Status: DC | PRN

## 2024-04-03 MED ORDER — DEXAMETHASONE SODIUM PHOSPHATE 10 MG/ML IJ SOLN
INTRAMUSCULAR | Status: DC | PRN
  Administered 2024-04-03: 10 mg via INTRAVENOUS

## 2024-04-03 MED ORDER — SODIUM CHLORIDE 0.9 % IV SOLN: 500.0000 mL | Freq: Once | INTRAVENOUS | Status: DC | PRN

## 2024-04-03 MED ORDER — EPHEDRINE SULFATE-NACL 50-0.9 MG/10ML-% IV SOSY
PREFILLED_SYRINGE | INTRAVENOUS | Status: DC | PRN
  Administered 2024-04-03 (×2): 10 mg via INTRAVENOUS

## 2024-04-03 MED ORDER — SUGAMMADEX SODIUM 200 MG/2ML IV SOLN
INTRAVENOUS | Status: DC | PRN
  Administered 2024-04-03: 200 mg via INTRAVENOUS

## 2024-04-03 MED ORDER — DIPHENHYDRAMINE HCL 50 MG/ML IJ SOLN: 50.0000 mg | Freq: Once | INTRAMUSCULAR | Status: DC | PRN

## 2024-04-03 MED ORDER — SODIUM CHLORIDE 0.9 % IV SOLN: INTRAVENOUS | Status: DC

## 2024-04-03 MED ORDER — CHLORHEXIDINE GLUCONATE CLOTH 2 % EX PADS
6.0000 | MEDICATED_PAD | Freq: Once | CUTANEOUS | Status: AC
  Administered 2024-04-03: 6 via TOPICAL

## 2024-04-03 MED ORDER — GABAPENTIN 300 MG PO CAPS
600.0000 mg | ORAL_CAPSULE | Freq: Every day | ORAL | Status: DC
  Administered 2024-04-03: 600 mg via ORAL
  Filled 2024-04-03: qty 2

## 2024-04-03 MED ORDER — HEPARIN SODIUM (PORCINE) 1000 UNIT/ML IJ SOLN
INTRAMUSCULAR | Status: DC | PRN
Start: 2024-04-03 — End: 2024-04-03
  Administered 2024-04-03: 6000 [IU] via INTRAVENOUS

## 2024-04-03 MED ORDER — OXYCODONE-ACETAMINOPHEN 5-325 MG PO TABS
1.0000 | ORAL_TABLET | ORAL | Status: DC | PRN
  Administered 2024-04-03: 1 via ORAL
  Filled 2024-04-03: qty 1

## 2024-04-03 MED ORDER — DOCUSATE SODIUM 100 MG PO CAPS
100.0000 mg | ORAL_CAPSULE | Freq: Every day | ORAL | Status: DC
  Administered 2024-04-04: 100 mg via ORAL
  Filled 2024-04-03: qty 1

## 2024-04-03 MED ORDER — SCOPOLAMINE 1 MG/3DAYS TD PT72
MEDICATED_PATCH | TRANSDERMAL | Status: AC
  Filled 2024-04-03: qty 1

## 2024-04-03 MED ORDER — FENTANYL CITRATE (PF) 100 MCG/2ML IJ SOLN
INTRAMUSCULAR | Status: AC
  Filled 2024-04-03: qty 2

## 2024-04-03 MED ORDER — ONDANSETRON HCL 4 MG/2ML IJ SOLN
4.0000 mg | Freq: Four times a day (QID) | INTRAMUSCULAR | Status: DC | PRN
  Administered 2024-04-03: 4 mg via INTRAVENOUS

## 2024-04-03 MED ORDER — HYDRALAZINE HCL 20 MG/ML IJ SOLN: 5.0000 mg | INTRAMUSCULAR | Status: DC | PRN

## 2024-04-03 MED ORDER — FAMOTIDINE 20 MG PO TABS: 40.0000 mg | ORAL_TABLET | Freq: Once | ORAL | Status: DC | PRN

## 2024-04-03 MED ORDER — PANTOPRAZOLE SODIUM 40 MG IV SOLR
40.0000 mg | Freq: Every day | INTRAVENOUS | Status: DC
  Administered 2024-04-03: 40 mg via INTRAVENOUS
  Filled 2024-04-03: qty 10

## 2024-04-03 MED ORDER — ACETAMINOPHEN 10 MG/ML IV SOLN: 1000.0000 mg | Freq: Once | INTRAVENOUS | Status: DC | PRN

## 2024-04-03 MED ORDER — ENOXAPARIN SODIUM 40 MG/0.4ML IJ SOSY
40.0000 mg | PREFILLED_SYRINGE | INTRAMUSCULAR | Status: DC
Start: 2024-04-04 — End: 2024-04-04
  Administered 2024-04-04: 40 mg via SUBCUTANEOUS
  Filled 2024-04-03: qty 0.4

## 2024-04-03 MED ORDER — BISACODYL 5 MG PO TBEC: 5.0000 mg | DELAYED_RELEASE_TABLET | Freq: Every day | ORAL | Status: DC | PRN

## 2024-04-03 MED ORDER — CEFAZOLIN SODIUM-DEXTROSE 2-4 GM/100ML-% IV SOLN
2.0000 g | INTRAVENOUS | Status: AC
  Administered 2024-04-03: 2 g via INTRAVENOUS

## 2024-04-03 MED ORDER — METHYLPREDNISOLONE SODIUM SUCC 125 MG IJ SOLR: 125.0000 mg | Freq: Once | INTRAMUSCULAR | Status: DC | PRN

## 2024-04-03 MED ORDER — MAGNESIUM SULFATE 2 GM/50ML IV SOLN: 2.0000 g | Freq: Every day | INTRAVENOUS | Status: DC | PRN

## 2024-04-03 MED ORDER — ORAL CARE MOUTH RINSE: 15.0000 mL | OROMUCOSAL | Status: DC | PRN

## 2024-04-03 MED ORDER — ONDANSETRON HCL 4 MG/2ML IJ SOLN: 4.0000 mg | Freq: Four times a day (QID) | INTRAMUSCULAR | Status: DC | PRN

## 2024-04-03 MED ORDER — CEFAZOLIN SODIUM-DEXTROSE 2-4 GM/100ML-% IV SOLN
INTRAVENOUS | Status: AC
Start: 2024-04-03 — End: 2024-04-03
  Filled 2024-04-03: qty 100

## 2024-04-03 MED ORDER — PHENYLEPHRINE HCL-NACL 20-0.9 MG/250ML-% IV SOLN
INTRAVENOUS | Status: AC
  Filled 2024-04-03: qty 250

## 2024-04-03 MED ORDER — ALBUMIN HUMAN 5 % IV SOLN
INTRAVENOUS | Status: DC | PRN
Start: 2024-04-03 — End: 2024-04-03

## 2024-04-03 MED ORDER — PROPOFOL 10 MG/ML IV BOLUS
INTRAVENOUS | Status: AC
Start: 2024-04-03 — End: 2024-04-03
  Filled 2024-04-03: qty 20

## 2024-04-03 MED ORDER — POLYETHYLENE GLYCOL 3350 17 G PO PACK
17.0000 g | PACK | Freq: Every day | ORAL | Status: DC | PRN
  Administered 2024-04-04: 17 g via ORAL
  Filled 2024-04-03: qty 1

## 2024-04-03 MED ORDER — NITROGLYCERIN IN D5W 200-5 MCG/ML-% IV SOLN: 5.0000 ug/min | INTRAVENOUS | Status: DC

## 2024-04-03 MED ORDER — ORAL CARE MOUTH RINSE: 15.0000 mL | Freq: Once | OROMUCOSAL | Status: AC

## 2024-04-03 MED ORDER — PHENYLEPHRINE HCL-NACL 20-0.9 MG/250ML-% IV SOLN
INTRAVENOUS | Status: DC | PRN
  Administered 2024-04-03: 20 ug/min via INTRAVENOUS

## 2024-04-03 MED ORDER — FENTANYL CITRATE (PF) 100 MCG/2ML IJ SOLN
25.0000 ug | INTRAMUSCULAR | Status: DC | PRN
  Administered 2024-04-03 (×2): 25 ug via INTRAVENOUS

## 2024-04-03 MED ORDER — HYDROMORPHONE HCL 1 MG/ML IJ SOLN: 1.0000 mg | Freq: Once | INTRAMUSCULAR | Status: DC | PRN

## 2024-04-03 MED ORDER — DOPAMINE-DEXTROSE 3.2-5 MG/ML-% IV SOLN: 3.0000 ug/kg/min | INTRAVENOUS | Status: DC

## 2024-04-03 MED ORDER — DEXMEDETOMIDINE HCL IN NACL 80 MCG/20ML IV SOLN
INTRAVENOUS | Status: DC | PRN
  Administered 2024-04-03 (×3): 4 ug via INTRAVENOUS

## 2024-04-03 MED ORDER — INSULIN ASPART 100 UNIT/ML IJ SOLN
0.0000 [IU] | Freq: Three times a day (TID) | INTRAMUSCULAR | Status: DC
  Filled 2024-04-03 (×2): qty 1

## 2024-04-03 MED ORDER — PRAVASTATIN SODIUM 20 MG PO TABS: 20.0000 mg | ORAL_TABLET | Freq: Every day | ORAL | Status: DC

## 2024-04-03 MED ORDER — PHENOL 1.4 % MT LIQD: 1.0000 | OROMUCOSAL | Status: DC | PRN

## 2024-04-03 MED ORDER — INSULIN ASPART 100 UNIT/ML IJ SOLN: 0.0000 [IU] | Freq: Every day | INTRAMUSCULAR | Status: DC

## 2024-04-03 MED ORDER — ALUM & MAG HYDROXIDE-SIMETH 200-200-20 MG/5ML PO SUSP: 15.0000 mL | ORAL | Status: DC | PRN

## 2024-04-03 MED ORDER — LIDOCAINE HCL (CARDIAC) PF 100 MG/5ML IV SOSY
PREFILLED_SYRINGE | INTRAVENOUS | Status: DC | PRN
  Administered 2024-04-03: 80 mg via INTRAVENOUS

## 2024-04-03 MED ORDER — LACTATED RINGERS IV SOLN: INTRAVENOUS | Status: DC

## 2024-04-03 SURGICAL SUPPLY — 47 items
BALLOON ARMADA 14X40X80 (BALLOONS) IMPLANT
CATH ACCU-VU SIZ PIG 5F 70CM (CATHETERS) IMPLANT
CATH BALLN CODA 9X100X32 (BALLOONS) IMPLANT
CATH KUMPE SOFT-VU 5FR 65 (CATHETERS) IMPLANT
CLAMP SUTURE YELLOW 5 PAIRS (MISCELLANEOUS) IMPLANT
CLOSURE PERCLOSE PROSTYLE (VASCULAR PRODUCTS) IMPLANT
COVER DRAPE FLUORO 36X44 (DRAPES) IMPLANT
COVER PROBE ULTRASOUND 5X96 (MISCELLANEOUS) IMPLANT
COVER SURGICAL LIGHT HANDLE (MISCELLANEOUS) IMPLANT
DERMABOND ADVANCED .7 DNX12 (GAUZE/BANDAGES/DRESSINGS) IMPLANT
DEVICE PRESTO INFLATION (MISCELLANEOUS) IMPLANT
DEVICE SAFEGUARD 24CM (GAUZE/BANDAGES/DRESSINGS) IMPLANT
DEVICE TORQUE (MISCELLANEOUS) IMPLANT
DRESSING SURGICEL FIBRLLR 1X2 (HEMOSTASIS) IMPLANT
DRSG OPSITE POSTOP 4X6 (GAUZE/BANDAGES/DRESSINGS) IMPLANT
ELECTRODE REM PT RTRN 9FT ADLT (ELECTROSURGICAL) IMPLANT
EXCLUDER TNK LEG 31MX14X13 (Endovascular Graft) IMPLANT
GLIDEWIRE STIFF .35X180X3 HYDR (WIRE) IMPLANT
GOWN STRL REUS W/ TWL LRG LVL3 (GOWN DISPOSABLE) IMPLANT
GRAFT VASC PATCH XENOSURE 1X14 (Vascular Products) ×1 IMPLANT
KIT MICROPUNCTURE VSI 5F STIFF (SHEATH) IMPLANT
LEG CONTRALATERAL 16X16X9.5 (Endovascular Graft) IMPLANT
LOOP VESSEL MAXI 1X406 RED (MISCELLANEOUS) IMPLANT
LOOP VESSEL MINI 0.8X406 BLUE (MISCELLANEOUS) IMPLANT
NDL ENTRY 21GA 7CM ECHOTIP (NEEDLE) IMPLANT
NEEDLE ENTRY 21GA 7CM ECHOTIP (NEEDLE) ×1 IMPLANT
PACK ANGIOGRAPHY (CUSTOM PROCEDURE TRAY) ×1 IMPLANT
PACK BASIN MAJOR ARMC (MISCELLANEOUS) IMPLANT
SET INTRO CAPELLA COAXIAL (SET/KITS/TRAYS/PACK) IMPLANT
SHEATH BRITE TIP 4FRX11 (SHEATH) IMPLANT
SHEATH BRITE TIP 6FRX11 (SHEATH) IMPLANT
SHEATH BRITE TIP 8FRX11 (SHEATH) IMPLANT
SHEATH DRYSEAL FLEX 12FR 33CM (SHEATH) IMPLANT
SHEATH DRYSEAL FLEX 18FR 33CM (SHEATH) IMPLANT
SPONGE XRAY 4X4 16PLY STRL (MISCELLANEOUS) IMPLANT
STAPLER SKIN PROX 35W (STAPLE) IMPLANT
STENT GRAFT CONTRALAT 16X20X9. (Endovascular Graft) IMPLANT
SUT MNCRL+ 5-0 UNDYED PC-3 (SUTURE) IMPLANT
SUT PROLENE 5 0 RB 1 DA (SUTURE) IMPLANT
SUT SILK 2-0 18XBRD TIE 12 (SUTURE) IMPLANT
SUT VIC AB 2-0 CT1 (SUTURE) IMPLANT
SUT VICRYL+ 3-0 36IN CT-1 (SUTURE) IMPLANT
SYR MEDRAD MARK 7 150ML (SYRINGE) IMPLANT
TRAY FOLEY SLVR 16FR LF STAT (SET/KITS/TRAYS/PACK) IMPLANT
TUBING CONTRAST HIGH PRESS 72 (TUBING) IMPLANT
WIRE AMPLATZ SSTIFF .035X260CM (WIRE) IMPLANT
WIRE J 3MM .035X145CM (WIRE) IMPLANT

## 2024-04-03 NOTE — Plan of Care (Signed)
  Problem: Education: Goal: Knowledge of discharge needs will improve Outcome: Progressing   Problem: Clinical Measurements: Goal: Postoperative complications will be avoided or minimized Outcome: Progressing   Problem: Respiratory: Goal: Will achieve and/or maintain a regular respiratory rate, without signs or symptoms of dyspnea Outcome: Progressing   Problem: Skin Integrity: Goal: Demonstration of wound healing without infection will improve Outcome: Progressing   Problem: Clinical Measurements: Goal: Will remain free from infection Outcome: Progressing   Problem: Elimination: Goal: Will not experience complications related to bowel motility Outcome: Progressing

## 2024-04-03 NOTE — Op Note (Signed)
 OPERATIVE NOTE   PROCEDURE: US  guidance for vascular access, bilateral femoral arteries Catheter placement into aorta from bilateral femoral approaches Placement of a 31 x 14 x 13 Gore Excluder Endoprosthesis main body with a 16 x 10 ipsilateral extension and a 20 x 10 contralateral limb ProGlide closure devices bilateral femoral arteries successful on the right unsuccessful on the left. Cut down left common femoral artery with direct repair of the vessel  PRE-OPERATIVE DIAGNOSIS: AAA  POST-OPERATIVE DIAGNOSIS: same  SURGEON: Devon Fogo, MD and Mikki Alexander, MD - Co-surgeons  ANESTHESIA: general  ESTIMATED BLOOD LOSS: 400 cc  FINDING(S): 1.  AAA  SPECIMEN(S):  none  INDICATIONS:   Thomas Morgan is a 73 y.o. y.o. male who presents with presents with an abdominal aortic aneurysm that is greater than 5 cm placing him at risk for lethal rupture.  The anatomy is suitable for endovascular repair.  Risks and benefits for repair of the abdominal aortic aneurysm using an endograft was described in detail to the patient all questions have been answered patient agrees to proceed.  Co-surgeons are utilized to expedite the procedure and reduce operative time improving patient's safety and improving outcome.  DESCRIPTION: After obtaining full informed written consent, the patient was brought back to the operating room and placed supine upon the operating table.  The patient received IV antibiotics prior to induction.  After obtaining adequate anesthesia, the patient was prepped and draped in the standard fashion for endovascular AAA repair.  Co-surgeons are required because this is a complex bilateral procedure with work being performed simultaneously from both the right femoral and left femoral approach.  This also expedites the procedure making a shorter operative time reducing complications and improving patient safety.  We then began by gaining access to both femoral arteries with US   guidance with me working on the patient's left and Dr. Vonna Guardian working on the patient's right.  The femoral arteries were found to be patent and accessed without difficulty with a needle under ultrasound guidance without difficulty on each side and permanent images were recorded.  We then placed 2 proglide devices on each side in a pre-close fashion and placed 8 French sheaths.  With the 8 French sheaths in place there was still some bleeding around the sheath on the left and we elected to place the 18 French sheath under fluoroscopic guidance at this time.  This was placed over the Amplatz wire that had been advanced through the smaller sheath under fluoroscopic guidance.  At this point we also exchanged the right-sided J-wire for an Amplatz wire under fluoroscopic guidance and placed the 12 French sheath  The patient was then given 6000 units of intravenous heparin.   The Pigtail catheter was placed into the aorta from the right side. Using this image, we selected a 31 x 14 x 13 Main body device.  The main body was then placed through the 18 French sheath.  Using the pigtail catheter that was placed up the right side a magnified image at the renal arteries was performed. The main body was then deployed just below the lowest renal artery. A Kumpe catheter was used to cannulate the contralateral gate without difficulty and successful cannulation was confirmed by twirling the pigtail catheter in the main body. We then placed a stiff wire and a retrograde arteriogram was performed through the right femoral sheath. We used the 12 French sheath in the right groin for the contralateral limb.  A 20 x 10 iliac extender limb  was selected for the contralateral portion and deployed without difficulty. The main body deployment was then completed. Based off the angiographic findings, extension limbs were necessary on the left.  16 x 10 ipsilateral iliac extension. All junction points and seals zones were treated with the  compliant balloon.   The pigtail catheter was then replaced and a completion angiogram was performed.   No endoleak was detected on completion angiography. The renal arteries were found to be widely patent.    At this point we elected to terminate the procedure. We secured the pro glide devices for hemostasis on the femoral arteries which was successful on the right and the skin incision was closed with a 4-0 Monocryl. Dermabond and pressure dressing were placed on the right.  On the left we had persistent bleeding in spite of 2 additional Perclose devices.  At this point we elected to perform a cutdown and the 18 French sheath was reinserted over the Amplatz wire.  A 14 x 40 balloon was then advanced over the Amplatz wire and inflated within the limb of the stent graft for temporary proximal control.  Vertical incision was made in the groin and the dissection carried down to the artery following the sheath.  I was able to get proximal control as well as identify the SFA distally.  Proximally a profunda clamp was used for vascular control and distally a red vessel loop was placed..  The common femoral artery was then dissected and the sheath balloon and wire were removed.  The arteriotomy did not seem to be unusual there was no stellate tear or avulsion of the artery itself.  Therefore, I repaired this using interrupted 5-0 Prolene.  Flushing maneuvers were performed and flow was established first to the profunda femoris and then the SFA.  Excellent pulse was needed noted in the SFA and the profunda after the repair.  Fibrillar was then placed in the wound followed by Vistaseal and then Heema blast.  The wound was then closed in layers using 2 layers of 2-0 Vicryl followed by 2 layers of 3-0 Vicryl skin was closed with staples.  Sterile dressing was applied.  The patient was hemodynamically stable throughout the procedure.  Patient was taken to the recovery room in stable condition having tolerated the procedure  well.  COMPLICATIONS: none  CONDITION: stable  Devon Fogo  04/03/2024, 10:33 AM

## 2024-04-03 NOTE — Anesthesia Preprocedure Evaluation (Addendum)
 Anesthesia Evaluation  Patient identified by MRN, date of birth, ID band Patient awake    Reviewed: Allergy & Precautions, NPO status , Patient's Chart, lab work & pertinent test results  History of Anesthesia Complications (+) PONV and history of anesthetic complications  Airway Mallampati: IV   Neck ROM: Full    Dental  (+) Upper Dentures, Partial Lower   Pulmonary COPD, Current Smoker (1 ppd)Patient did not abstain from smoking.   Pulmonary exam normal breath sounds clear to auscultation       Cardiovascular hypertension, + CAD and + Peripheral Vascular Disease (AAA)  Normal cardiovascular exam Rhythm:Regular Rate:Normal  Myocardial perfusion 02/08/24:  Pharmacological myocardial perfusion imaging study with no significant  ischemia Normal wall motion,  EF estimated at 45% (possibly reduced secondary to GI uptake artifact) No EKG changes concerning for ischemia at peak stress or in recovery. CT attenuation correction images detail mild three-vessel coronary calcification, mild aortic atherosclerosis Low risk scan  ECG 02/01/24: normal   Neuro/Psych negative neurological ROS     GI/Hepatic ,GERD  ,,  Endo/Other  diabetes, Type 2    Renal/GU negative Renal ROS     Musculoskeletal  (+) Arthritis ,    Abdominal   Peds  Hematology negative hematology ROS (+)   Anesthesia Other Findings Cardiology note 02/01/24:  1. Coronary artery disease involving native coronary artery of native heart, unspecified whether angina present  2. Pre-procedural cardiovascular examination  3. Primary hypertension  4. Mixed hyperlipidemia  5. Smoking greater than 40 pack years    PLAN:    In order of problems listed above:   1. CAD, LAD, left circumflex calcifications on chest CT 12/2023.  Obtain echocardiogram, obtain Lexiscan  Myoview .  Start aspirin  81 mg, continue Zetia , lovastatin  40 mg daily. 2. Preprocedural exam, infrarenal  aortic aneurysmal repair being planned.  Cardiac eval with echo and Lexiscan  Myoview  as above.  Okay for procedure if no significant abnormalities noted on Myoview  and echo. 3. Hypertension, BP controlled.  Continue hydrochlorothiazide . 4. Hyperlipidemia, statin intolerance.  Continue Mevacor  40 mg daily, Zetia  10 mg daily. 5. Current smoker, smoking cessation strongly advised.   Follow-up in 2 to 3 months after cardiac testing   Reproductive/Obstetrics                             Anesthesia Physical Anesthesia Plan  ASA: 3  Anesthesia Plan: General   Post-op Pain Management:    Induction: Intravenous  PONV Risk Score and Plan: 2 and Ondansetron , Dexamethasone , Treatment may vary due to age or medical condition and Scopolamine patch - Pre-op  Airway Management Planned: Oral ETT  Additional Equipment:   Intra-op Plan:   Post-operative Plan: Extubation in OR  Informed Consent: I have reviewed the patients History and Physical, chart, labs and discussed the procedure including the risks, benefits and alternatives for the proposed anesthesia with the patient or authorized representative who has indicated his/her understanding and acceptance.     Dental advisory given  Plan Discussed with: CRNA  Anesthesia Plan Comments: (Patient consented for risks of anesthesia including but not limited to:  - adverse reactions to medications - damage to eyes, teeth, lips or other oral mucosa - nerve damage due to positioning  - sore throat or hoarseness - damage to heart, brain, nerves, lungs, other parts of body or loss of life  Informed patient about role of CRNA in peri- and intra-operative care.  Patient voiced understanding.)  Anesthesia Quick Evaluation

## 2024-04-03 NOTE — Progress Notes (Signed)
 Pt's wife took belongings, including clothing, shoes, and upper/lower dentures.

## 2024-04-03 NOTE — Interval H&P Note (Signed)
 History and Physical Interval Note:  04/03/2024 6:59 AM  Thomas Morgan  has presented today for surgery, with the diagnosis of AAA   GORE   ANESTHESIA    AAA repair.  The various methods of treatment have been discussed with the patient and family. After consideration of risks, benefits and other options for treatment, the patient has consented to  Procedure(s): ENDOVASCULAR REPAIR/STENT GRAFT (N/A) as a surgical intervention.  The patient's history has been reviewed, patient examined, no change in status, stable for surgery.  I have reviewed the patient's chart and labs.  Questions were answered to the patient's satisfaction.     Devon Fogo

## 2024-04-03 NOTE — H&P (View-Only) (Signed)
 MRN : [id number]  Thomas Morgan is a 73 y.o. (05/04/1951) male who presents with chief complaint of check circulation.  History of Present Illness:   Patient presents to [facility] for repair of his abdominal aortic aneurysm.  He was last seen in the office January 25, 2024.  This was a post CT follow-up.There were no problems or complications related to the CT scan.    The patient denies interval development of abdominal or back pain. No new lower extremity pain or discoloration of the toes.    The patient denies interval anaurosis fugax. There is no recent history of TIA symptoms or focal motor deficits.    The patient denies PAD or claudication symptoms.    The patient denies recent episodes of angina or shortness of breath.   CT angiography of the abdomen and pelvis is reviewed by me and shows an infrarenal AAA 4.9 cm.  Although there is an adequate length neck the neck does measure 29 mm in diameter and does have some thrombus present.  No outpatient medications have been marked as taking for the 04/03/24 encounter [facility] Encounter).    Past Medical History:  Diagnosis Date   Allergy    Arthritis    Atherosclerosis of aorta (HCC)    Basal cell carcinoma    CAD (coronary artery disease)    Complication of anesthesia    a.) PONV   Diastolic dysfunction    Diverticulosis    DM (diabetes mellitus), type 2 (HCC)    ED (erectile dysfunction)    Emphysema of lung (HCC)    GERD (gastroesophageal reflux disease)    Hepatic steatosis    History of adenomatous polyp of colon    HTN (hypertension)    Hyperlipidemia    Infrarenal abdominal aortic aneurysm (AAA) without rupture (HCC)    Long-term use of aspirin  therapy    Lumbar canal stenosis    PAD (peripheral artery disease) (HCC)    PONV (postoperative nausea and vomiting)    Psoriasis    Tobacco abuse    Ventral hernia     Vitamin D  deficiency     Past Surgical History:  Procedure Laterality Date   BACK SURGERY     BASAL CELL CARCINOMA EXCISION Left 2020   left arm   CYST REMOVAL TRUNK  2020   on his back   ELBOW SURGERY Left    EXCISION MORTON'S NEUROMA Left 03/24/2017   Procedure: EXCISION MORTON'S NEUROMA;  Surgeon: Angel Barba, DPM;  Location: [facility] ORS;  Service: Podiatry;  Laterality: Left;   FOOT SURGERY Right 04/16/2021   FRACTURE SURGERY  1970s   Left elbow. Right wrist   SKIN CANCER EXCISION     SPINE SURGERY     WRIST SURGERY Right    Trauma resulting in prosthetics in the right wrist and elbow    Social History Social History   Tobacco Use   Smoking status: Every Day    Current packs/day: 1.00    Average packs/day: 1 pack/day for 48.0 years (48.0 ttl pk-yrs)    Types: Cigarettes  Smokeless tobacco: Never   Tobacco comments:    1 pack a day,   Vaping Use   Vaping status: Never Used  Substance Use Topics   Alcohol use: No   Drug use: No    Family History Family History  Problem Relation Age of Onset   Cancer Mother        Lung   Hypertension Mother    Arthritis Mother    Heart Problems Father    AAA (abdominal aortic aneurysm) Father    Heart attack Brother    Hypercholesterolemia Brother    Neuropathy Neg Hx     Allergies  Allergen Reactions   Hydrocodone Other (See Comments)    Palpitations, increased heart rate   Statins    Atorvastatin      Aching and feeling down   Crestor [Rosuvastatin] Other (See Comments)    Muscle aches   Lescol  [Fluvastatin]     Muscle Aches     REVIEW OF SYSTEMS (Negative unless checked)  Constitutional: [] Weight loss  [] Fever  [] Chills Cardiac: [] Chest pain   [] Chest pressure   [] Palpitations   [] Shortness of breath when laying flat   [] Shortness of breath with exertion. Vascular:  [x] Pain in legs with walking   [] Pain in legs at rest  [] History of DVT   [] Phlebitis   [] Swelling in legs   [] Varicose veins   [] Non-healing  ulcers Pulmonary:   [] Uses home oxygen   [] Productive cough   [] Hemoptysis   [] Wheeze  [] COPD   [] Asthma Neurologic:  [] Dizziness   [] Seizures   [] History of stroke   [] History of TIA  [] Aphasia   [] Vissual changes   [] Weakness or numbness in arm   [] Weakness or numbness in leg Musculoskeletal:   [] Joint swelling   [] Joint pain   [] Low back pain Hematologic:  [] Easy bruising  [] Easy bleeding   [] Hypercoagulable state   [] Anemic Gastrointestinal:  [] Diarrhea   [] Vomiting  [] Gastroesophageal reflux/heartburn   [] Difficulty swallowing. Genitourinary:  [] Chronic kidney disease   [] Difficult urination  [] Frequent urination   [] Blood in urine Skin:  [] Rashes   [] Ulcers  Psychological:  [] History of anxiety   []  History of major depression.  Physical Examination  There were no vitals filed for this visit. There is no height or weight on file to calculate BMI. Gen: WD/WN, NAD Head: [place]/AT, No temporalis wasting.  Ear/Nose/Throat: Hearing grossly intact, nares w/o erythema or drainage Eyes: PER, EOMI, sclera nonicteric.  Neck: Supple, no masses.  No bruit or JVD.  Pulmonary:  Good air movement, no audible wheezing, no use of accessory muscles.  Cardiac: RRR, normal S1, S2, no Murmurs. Vascular:  mild trophic changes, no open wounds Vessel Right Left  Radial Palpable Palpable  PT Not Palpable Not Palpable  DP Not Palpable Not Palpable  Gastrointestinal: soft, non-distended. No guarding/no peritoneal signs.  Musculoskeletal: M/S 5/5 throughout.  No visible deformity.  Neurologic: CN 2-12 intact. Pain and light touch intact in extremities.  Symmetrical.  Speech is fluent. Motor exam as listed above. Psychiatric: Judgment intact, Mood & affect appropriate for pt's clinical situation. Dermatologic: No rashes or ulcers noted.  No changes consistent with cellulitis.   CBC Lab Results  Component Value Date   WBC 9.8 03/22/2024   HGB 14.5 03/22/2024   HCT 41.9 03/22/2024   MCV 92.5 03/22/2024    PLT 324 03/22/2024    BMET    Component Value Date/Time   NA 140 03/22/2024 0841   NA 142 11/28/2023 0852  K 3.4 (L) 03/22/2024 0841   CL 102 03/22/2024 0841   CO2 29 03/22/2024 0841   GLUCOSE 123 (H) 03/22/2024 0841   BUN 14 03/22/2024 0841   BUN 15 11/28/2023 0852   CREATININE 0.62 03/22/2024 0841   CALCIUM  8.9 03/22/2024 0841   GFRNONAA >60 03/22/2024 0841   GFRAA 108 04/10/2020 1456   Estimated Creatinine Clearance: 93.3 mL/min (by C-G formula based on SCr of 0.62 mg/dL).  COAG No results found for: INR, PROTIME  Radiology ECHOCARDIOGRAM COMPLETE Result Date: 03/12/2024    ECHOCARDIOGRAM REPORT   Patient Name:   FLINT HAKEEM Date of Exam: 03/12/2024 Medical Rec #:  [id number]     Height:       70.0 in Accession #:    [id number]    Weight:       196.8 lb Date of Birth:  1950/12/29     BSA:          2.073 m Patient Age:    72 years      BP:           136/70 mmHg Patient Gender: M             HR:           70 bpm. Exam Location:  [facility] Procedure: 2D Echo, Cardiac Doppler and Color Doppler (Both Spectral and Color            Flow Doppler were utilized during procedure). Indications:    I25.110 Atherosclerotic heart disease of native coronary artery                 with unstable angina pectoris  History:        Patient has prior history of Echocardiogram examinations. CAD,                 PAD; Risk Factors:Hypertension, Dyslipidemia and Current Smoker.  Sonographer:    Alicia Inoue Referring Phys: [id number] BRIAN AGBOR-ETANG IMPRESSIONS  1. Left ventricular ejection fraction, by estimation, is 55 to 60%. The left ventricle has normal function. The left ventricle has no regional wall motion abnormalities. Left ventricular diastolic parameters are consistent with Grade I diastolic dysfunction (impaired relaxation).  2. Right ventricular systolic function is normal. The right ventricular size is normal.  3. The mitral valve is normal in structure. No evidence of mitral valve  regurgitation. No evidence of mitral stenosis.  4. The aortic valve is normal in structure. Aortic valve regurgitation is not visualized. No aortic stenosis is present.  5. The inferior vena cava is normal in size with greater than 50% respiratory variability, suggesting right atrial pressure of 3 mmHg. FINDINGS  Left Ventricle: Left ventricular ejection fraction, by estimation, is 55 to 60%. The left ventricle has normal function. The left ventricle has no regional wall motion abnormalities. Strain was performed and the global longitudinal strain is indeterminate. The left ventricular internal cavity size was normal in size. There is no left ventricular hypertrophy. Left ventricular diastolic parameters are consistent with Grade I diastolic dysfunction (impaired relaxation). Right Ventricle: The right ventricular size is normal. No increase in right ventricular wall thickness. Right ventricular systolic function is normal. Left Atrium: Left atrial size was normal in size. Right Atrium: Right atrial size was normal in size. Pericardium: There is no evidence of pericardial effusion. Mitral Valve: The mitral valve is normal in structure. No evidence of mitral valve regurgitation. No evidence of mitral valve stenosis. Tricuspid Valve: The tricuspid valve is normal in structure.  Tricuspid valve regurgitation is not demonstrated. No evidence of tricuspid stenosis. Aortic Valve: The aortic valve is normal in structure. Aortic valve regurgitation is not visualized. No aortic stenosis is present. Aortic valve mean gradient measures 4.0 mmHg. Aortic valve peak gradient measures 7.2 mmHg. Aortic valve area, by VTI measures 2.18 cm. Pulmonic Valve: The pulmonic valve was normal in structure. Pulmonic valve regurgitation is not visualized. No evidence of pulmonic stenosis. Aorta: The aortic root is normal in size and structure. Venous: The inferior vena cava is normal in size with greater than 50% respiratory variability,  suggesting right atrial pressure of 3 mmHg. IAS/Shunts: No atrial level shunt detected by color flow Doppler. Additional Comments: 3D was performed not requiring image post processing on an independent workstation and was indeterminate.  LEFT VENTRICLE PLAX 2D LVIDd:         4.10 cm     Diastology LVIDs:         3.00 cm     LV e' medial:    6.20 cm/s LV PW:         1.00 cm     LV E/e' medial:  9.6 LV IVS:        1.10 cm     LV e' lateral:   6.96 cm/s LVOT diam:     2.00 cm     LV E/e' lateral: 8.5 LV SV:         61 LV SV Index:   29 LVOT Area:     3.14 cm  LV Volumes (MOD) LV vol d, MOD A2C: 95.0 ml LV vol d, MOD A4C: 65.5 ml LV vol s, MOD A2C: 28.6 ml LV vol s, MOD A4C: 25.9 ml LV SV MOD A2C:     66.4 ml LV SV MOD A4C:     65.5 ml LV SV MOD BP:      51.2 ml RIGHT VENTRICLE             IVC RV Basal diam:  4.10 cm     IVC diam: 1.90 cm RV Mid diam:    3.00 cm RV S prime:     13.20 cm/s TAPSE (M-mode): 2.7 cm LEFT ATRIUM             Index        RIGHT ATRIUM           Index LA Vol (A2C):   87.8 ml 42.35 ml/m  RA Area:     14.70 cm LA Vol (A4C):   54.4 ml 26.24 ml/m  RA Volume:   36.60 ml  17.66 ml/m LA Biplane Vol: 71.4 ml 34.44 ml/m  AORTIC VALVE AV Area (Vmax):    1.94 cm AV Area (Vmean):   2.01 cm AV Area (VTI):     2.18 cm AV Vmax:           134.50 cm/s AV Vmean:          88.400 cm/s AV VTI:            0.278 m AV Peak Grad:      7.2 mmHg AV Mean Grad:      4.0 mmHg LVOT Vmax:         82.90 cm/s LVOT Vmean:        56.600 cm/s LVOT VTI:          0.193 m LVOT/AV VTI ratio: 0.69  AORTA Ao Sinus diam: 3.50 cm Ao Asc diam:   3.50 cm MITRAL VALVE MV Area (  PHT): 2.22 cm    SHUNTS MV Decel Time: 342 msec    Systemic VTI:  0.19 m MV E velocity: 59.50 cm/s  Systemic Diam: 2.00 cm MV A velocity: 82.10 cm/s MV E/A ratio:  0.72 Belva Boyden MD Electronically signed by Belva Boyden MD Signature Date/Time: 03/12/2024/7:02:26 PM    Final      Assessment/Plan 1. Infrarenal abdominal aortic aneurysm (AAA) without  rupture (HCC) (Primary) Recommend:  The AAA is 5 cm and therefore should undergo repair. Patient is status post CT scan of the abdominal aorta. Base on evaluation of the CT scan by me the patient is a candidate for endovascular repair.    The patient will require cardiac clearance prior to stent graft placement.    The patient will continue antiplatelet therapy as prescribed (since the patient is undergoing endovascular repair as opposed to open repair) as well as aggressive management of hyperlipidemia. Exercise is encouraged.    The patient is reminded that lifetime routine surveillance is a necessity with an endograft.    The risks and benefits of AAA repair are reviewed with the patient.  All questions are answered.  Alternative therapies are also discussed.  The patient agrees to proceed with endovascular aneurysm repair to prevent lethal rupture.   Patient will follow-up with me in the office after the surgery.   2. PAD (peripheral artery disease) (HCC)  Recommend:   The patient has evidence of atherosclerosis of the lower extremities with claudication.  The patient does not voice lifestyle limiting changes at this point in time.   Noninvasive studies do not suggest clinically significant change.   No invasive studies, angiography or surgery at this time The patient should continue walking and begin a more formal exercise program.  The patient should continue antiplatelet therapy and aggressive treatment of the lipid abnormalities   No changes in the patient's medications at this time   Continued surveillance is indicated as atherosclerosis is likely to progress with time.     The patient will continue follow up with noninvasive studies as ordered.    3. Coronary arteriosclerosis The patient has been cleared by [facility] cardiology   Continue cardiac and antihypertensive medications as already ordered and reviewed, no changes at this time.   Continue statin as ordered and  reviewed, no changes at this time   Nitrates PRN for chest pain   4. Primary hypertension Continue antihypertensive medications as already ordered, these medications have been reviewed and there are no changes at this time.   5. Type 2 diabetes mellitus with other circulatory complication, without long-term current use of insulin (HCC) Continue hypoglycemic medications as already ordered, these medications have been reviewed and there are no changes at this time.   Hgb A1C to be monitored as already arranged by primary service     Devon Fogo, MD  04/03/2024 6:56 AM

## 2024-04-03 NOTE — Op Note (Signed)
 OPERATIVE NOTE   PROCEDURE: US  guidance for vascular access, bilateral femoral arteries Catheter placement into aorta from bilateral femoral approaches Placement of a 31 mm diameter proximal Gore Excluder Endoprosthesis main body left with a 20 mm diameter by 10 cm length right iliac contralateral limb Placement of a 16 mm diameter by 10 cm length left iliac limb extension ProGlide closure devices bilateral femoral arteries Left femoral artery cutdown for repair of left common femoral artery  PRE-OPERATIVE DIAGNOSIS: AAA  POST-OPERATIVE DIAGNOSIS: same  SURGEON: Mikki Alexander, MD and Devon Fogo, MD - Co-surgeons  ANESTHESIA: General  ESTIMATED BLOOD LOSS: 400 cc  FINDING(S): 1.  AAA  SPECIMEN(S):  none  INDICATIONS:   Thomas Morgan is a 73 y.o. male who presents with an aneurysm of greater than 5 cm with risk of lethal rupture. The anatomy was suitable for endovascular repair.  Risks and benefits of repair in an endovascular fashion were discussed and informed consent was obtained. Co-surgeons are used to expedite the procedure and reduce operative time as bilateral work needs to be done.  DESCRIPTION: After obtaining full informed written consent, the patient was brought back to the operating room and placed supine upon the operating table.  The patient received IV antibiotics prior to induction.  After obtaining adequate anesthesia, the patient was prepped and draped in the standard fashion for endovascular AAA repair.  We then began by gaining access to both femoral arteries with US  guidance with me working on the right and Dr. Prescilla Brod working on the left.  The femoral arteries were found to be patent and accessed without difficulty with a needle under ultrasound guidance without difficulty on each side and permanent images were recorded.  We then placed 2 proglide devices on each side in a pre-close fashion and placed 8 French sheaths. The patient was then given 6000 units of  intravenous heparin. The Pigtail catheter was placed into the aorta from the right side. Using this image, we selected a 31 mm diameter by 14 mm x 13 cm length Gore excluder C3 Main body device.  Over a stiff wire, an 62 French sheath was placed up the left side. The main body was then placed through the 18 French sheath. A Kumpe catheter was placed up the right side and a magnified image at the renal arteries was performed. The main body was then deployed just below the lowest renal artery. The Kumpe catheter was used to cannulate the contralateral gate without difficulty and successful cannulation was confirmed by twirling the pigtail catheter in the main body. We then placed a stiff wire and a retrograde arteriogram was performed through the right femoral sheath. We upsized to the 12 [place] sheath for the contralateral limb and a 20 mm diameter by 10 cm length right iliac extension limb limb was selected and deployed. The main body deployment was then completed. Based off the angiographic findings, extension limbs were necessary.  A 16 mm diameter by 10 cm length left iliac extension limb was selected and deployed just above the left internal iliac artery. All junction points and seals zones were treated with the compliant balloon. The pigtail catheter was then replaced and a completion angiogram was performed.  No endoleak was detected on completion angiography. The renal arteries were found to be widely patent.  Both internal iliac arteries were found to be widely patent. At this point we elected to terminate the procedure.  On the right side, we secured the pro glide devices for  hemostasis on the femoral arteries. The skin incision was closed with a 4-0 Monocryl. Dermabond and pressure dressing were placed.  On the left side, attempts at securing 4 Pro-glide devices did not gain hemostasis of the left femoral artery.  Manual pressure was used for control initially and we elected to cut down on the left femoral  artery for a direct repair of the left femoral artery.  The 18 French sheath was placed back and a 14 mm diameter by 4 cm length balloon was inflated in the left iliac limb for proximal control.  We cut down on the artery exposing the common femoral artery proximally and distally and getting down on to the superficial femoral artery for control in the profunda femoris artery was identified as well.  An anterior wall branch that had some bleeding was ligated with 5-0 Prolene.  Once we had proximal and distal control, the vessel was repaired with a series of interrupted 5-0 Prolene sutures.  On release of control, excellent pulsatile flow was demonstrated with no further bleeding.  The wound was irrigated and fibrillar, Vistaseal, and Hemoblast were placed and hemostasis was complete.  The left femoral incision was then closed with a series of two 2-0 Vicryl's, two 3-0 Vicryl's, and staples for the skin.  The patient was taken to the recovery room in stable condition having tolerated the procedure well.  COMPLICATIONS: none  CONDITION: stable  Mikki Alexander  04/03/2024, 10:37 AM   This note was created with Dragon Medical transcription system. Any errors in dictation are purely unintentional.

## 2024-04-03 NOTE — Progress Notes (Signed)
 MRN : [id number]  Thomas Morgan is a 73 y.o. (05/04/1951) male who presents with chief complaint of check circulation.  History of Present Illness:   Patient presents to [facility] for repair of his abdominal aortic aneurysm.  He was last seen in the office January 25, 2024.  This was a post CT follow-up.There were no problems or complications related to the CT scan.    The patient denies interval development of abdominal or back pain. No new lower extremity pain or discoloration of the toes.    The patient denies interval anaurosis fugax. There is no recent history of TIA symptoms or focal motor deficits.    The patient denies PAD or claudication symptoms.    The patient denies recent episodes of angina or shortness of breath.   CT angiography of the abdomen and pelvis is reviewed by me and shows an infrarenal AAA 4.9 cm.  Although there is an adequate length neck the neck does measure 29 mm in diameter and does have some thrombus present.  No outpatient medications have been marked as taking for the 04/03/24 encounter [facility] Encounter).    Past Medical History:  Diagnosis Date   Allergy    Arthritis    Atherosclerosis of aorta (HCC)    Basal cell carcinoma    CAD (coronary artery disease)    Complication of anesthesia    a.) PONV   Diastolic dysfunction    Diverticulosis    DM (diabetes mellitus), type 2 (HCC)    ED (erectile dysfunction)    Emphysema of lung (HCC)    GERD (gastroesophageal reflux disease)    Hepatic steatosis    History of adenomatous polyp of colon    HTN (hypertension)    Hyperlipidemia    Infrarenal abdominal aortic aneurysm (AAA) without rupture (HCC)    Long-term use of aspirin  therapy    Lumbar canal stenosis    PAD (peripheral artery disease) (HCC)    PONV (postoperative nausea and vomiting)    Psoriasis    Tobacco abuse    Ventral hernia     Vitamin D  deficiency     Past Surgical History:  Procedure Laterality Date   BACK SURGERY     BASAL CELL CARCINOMA EXCISION Left 2020   left arm   CYST REMOVAL TRUNK  2020   on his back   ELBOW SURGERY Left    EXCISION MORTON'S NEUROMA Left 03/24/2017   Procedure: EXCISION MORTON'S NEUROMA;  Surgeon: Angel Barba, DPM;  Location: [facility] ORS;  Service: Podiatry;  Laterality: Left;   FOOT SURGERY Right 04/16/2021   FRACTURE SURGERY  1970s   Left elbow. Right wrist   SKIN CANCER EXCISION     SPINE SURGERY     WRIST SURGERY Right    Trauma resulting in prosthetics in the right wrist and elbow    Social History Social History   Tobacco Use   Smoking status: Every Day    Current packs/day: 1.00    Average packs/day: 1 pack/day for 48.0 years (48.0 ttl pk-yrs)    Types: Cigarettes  Smokeless tobacco: Never   Tobacco comments:    1 pack a day,   Vaping Use   Vaping status: Never Used  Substance Use Topics   Alcohol use: No   Drug use: No    Family History Family History  Problem Relation Age of Onset   Cancer Mother        Lung   Hypertension Mother    Arthritis Mother    Heart Problems Father    AAA (abdominal aortic aneurysm) Father    Heart attack Brother    Hypercholesterolemia Brother    Neuropathy Neg Hx     Allergies  Allergen Reactions   Hydrocodone Other (See Comments)    Palpitations, increased heart rate   Statins    Atorvastatin      Aching and feeling down   Crestor [Rosuvastatin] Other (See Comments)    Muscle aches   Lescol  [Fluvastatin]     Muscle Aches     REVIEW OF SYSTEMS (Negative unless checked)  Constitutional: [] Weight loss  [] Fever  [] Chills Cardiac: [] Chest pain   [] Chest pressure   [] Palpitations   [] Shortness of breath when laying flat   [] Shortness of breath with exertion. Vascular:  [x] Pain in legs with walking   [] Pain in legs at rest  [] History of DVT   [] Phlebitis   [] Swelling in legs   [] Varicose veins   [] Non-healing  ulcers Pulmonary:   [] Uses home oxygen   [] Productive cough   [] Hemoptysis   [] Wheeze  [] COPD   [] Asthma Neurologic:  [] Dizziness   [] Seizures   [] History of stroke   [] History of TIA  [] Aphasia   [] Vissual changes   [] Weakness or numbness in arm   [] Weakness or numbness in leg Musculoskeletal:   [] Joint swelling   [] Joint pain   [] Low back pain Hematologic:  [] Easy bruising  [] Easy bleeding   [] Hypercoagulable state   [] Anemic Gastrointestinal:  [] Diarrhea   [] Vomiting  [] Gastroesophageal reflux/heartburn   [] Difficulty swallowing. Genitourinary:  [] Chronic kidney disease   [] Difficult urination  [] Frequent urination   [] Blood in urine Skin:  [] Rashes   [] Ulcers  Psychological:  [] History of anxiety   []  History of major depression.  Physical Examination  There were no vitals filed for this visit. There is no height or weight on file to calculate BMI. Gen: WD/WN, NAD Head: [place]/AT, No temporalis wasting.  Ear/Nose/Throat: Hearing grossly intact, nares w/o erythema or drainage Eyes: PER, EOMI, sclera nonicteric.  Neck: Supple, no masses.  No bruit or JVD.  Pulmonary:  Good air movement, no audible wheezing, no use of accessory muscles.  Cardiac: RRR, normal S1, S2, no Murmurs. Vascular:  mild trophic changes, no open wounds Vessel Right Left  Radial Palpable Palpable  PT Not Palpable Not Palpable  DP Not Palpable Not Palpable  Gastrointestinal: soft, non-distended. No guarding/no peritoneal signs.  Musculoskeletal: M/S 5/5 throughout.  No visible deformity.  Neurologic: CN 2-12 intact. Pain and light touch intact in extremities.  Symmetrical.  Speech is fluent. Motor exam as listed above. Psychiatric: Judgment intact, Mood & affect appropriate for pt's clinical situation. Dermatologic: No rashes or ulcers noted.  No changes consistent with cellulitis.   CBC Lab Results  Component Value Date   WBC 9.8 03/22/2024   HGB 14.5 03/22/2024   HCT 41.9 03/22/2024   MCV 92.5 03/22/2024    PLT 324 03/22/2024    BMET    Component Value Date/Time   NA 140 03/22/2024 0841   NA 142 11/28/2023 0852  K 3.4 (L) 03/22/2024 0841   CL 102 03/22/2024 0841   CO2 29 03/22/2024 0841   GLUCOSE 123 (H) 03/22/2024 0841   BUN 14 03/22/2024 0841   BUN 15 11/28/2023 0852   CREATININE 0.62 03/22/2024 0841   CALCIUM  8.9 03/22/2024 0841   GFRNONAA >60 03/22/2024 0841   GFRAA 108 04/10/2020 1456   Estimated Creatinine Clearance: 93.3 mL/min (by C-G formula based on SCr of 0.62 mg/dL).  COAG No results found for: INR, PROTIME  Radiology ECHOCARDIOGRAM COMPLETE Result Date: 03/12/2024    ECHOCARDIOGRAM REPORT   Patient Name:   FLINT HAKEEM Date of Exam: 03/12/2024 Medical Rec #:  [id number]     Height:       70.0 in Accession #:    [id number]    Weight:       196.8 lb Date of Birth:  1950/12/29     BSA:          2.073 m Patient Age:    72 years      BP:           136/70 mmHg Patient Gender: M             HR:           70 bpm. Exam Location:  [facility] Procedure: 2D Echo, Cardiac Doppler and Color Doppler (Both Spectral and Color            Flow Doppler were utilized during procedure). Indications:    I25.110 Atherosclerotic heart disease of native coronary artery                 with unstable angina pectoris  History:        Patient has prior history of Echocardiogram examinations. CAD,                 PAD; Risk Factors:Hypertension, Dyslipidemia and Current Smoker.  Sonographer:    Alicia Inoue Referring Phys: [id number] BRIAN AGBOR-ETANG IMPRESSIONS  1. Left ventricular ejection fraction, by estimation, is 55 to 60%. The left ventricle has normal function. The left ventricle has no regional wall motion abnormalities. Left ventricular diastolic parameters are consistent with Grade I diastolic dysfunction (impaired relaxation).  2. Right ventricular systolic function is normal. The right ventricular size is normal.  3. The mitral valve is normal in structure. No evidence of mitral valve  regurgitation. No evidence of mitral stenosis.  4. The aortic valve is normal in structure. Aortic valve regurgitation is not visualized. No aortic stenosis is present.  5. The inferior vena cava is normal in size with greater than 50% respiratory variability, suggesting right atrial pressure of 3 mmHg. FINDINGS  Left Ventricle: Left ventricular ejection fraction, by estimation, is 55 to 60%. The left ventricle has normal function. The left ventricle has no regional wall motion abnormalities. Strain was performed and the global longitudinal strain is indeterminate. The left ventricular internal cavity size was normal in size. There is no left ventricular hypertrophy. Left ventricular diastolic parameters are consistent with Grade I diastolic dysfunction (impaired relaxation). Right Ventricle: The right ventricular size is normal. No increase in right ventricular wall thickness. Right ventricular systolic function is normal. Left Atrium: Left atrial size was normal in size. Right Atrium: Right atrial size was normal in size. Pericardium: There is no evidence of pericardial effusion. Mitral Valve: The mitral valve is normal in structure. No evidence of mitral valve regurgitation. No evidence of mitral valve stenosis. Tricuspid Valve: The tricuspid valve is normal in structure.  Tricuspid valve regurgitation is not demonstrated. No evidence of tricuspid stenosis. Aortic Valve: The aortic valve is normal in structure. Aortic valve regurgitation is not visualized. No aortic stenosis is present. Aortic valve mean gradient measures 4.0 mmHg. Aortic valve peak gradient measures 7.2 mmHg. Aortic valve area, by VTI measures 2.18 cm. Pulmonic Valve: The pulmonic valve was normal in structure. Pulmonic valve regurgitation is not visualized. No evidence of pulmonic stenosis. Aorta: The aortic root is normal in size and structure. Venous: The inferior vena cava is normal in size with greater than 50% respiratory variability,  suggesting right atrial pressure of 3 mmHg. IAS/Shunts: No atrial level shunt detected by color flow Doppler. Additional Comments: 3D was performed not requiring image post processing on an independent workstation and was indeterminate.  LEFT VENTRICLE PLAX 2D LVIDd:         4.10 cm     Diastology LVIDs:         3.00 cm     LV e' medial:    6.20 cm/s LV PW:         1.00 cm     LV E/e' medial:  9.6 LV IVS:        1.10 cm     LV e' lateral:   6.96 cm/s LVOT diam:     2.00 cm     LV E/e' lateral: 8.5 LV SV:         61 LV SV Index:   29 LVOT Area:     3.14 cm  LV Volumes (MOD) LV vol d, MOD A2C: 95.0 ml LV vol d, MOD A4C: 65.5 ml LV vol s, MOD A2C: 28.6 ml LV vol s, MOD A4C: 25.9 ml LV SV MOD A2C:     66.4 ml LV SV MOD A4C:     65.5 ml LV SV MOD BP:      51.2 ml RIGHT VENTRICLE             IVC RV Basal diam:  4.10 cm     IVC diam: 1.90 cm RV Mid diam:    3.00 cm RV S prime:     13.20 cm/s TAPSE (M-mode): 2.7 cm LEFT ATRIUM             Index        RIGHT ATRIUM           Index LA Vol (A2C):   87.8 ml 42.35 ml/m  RA Area:     14.70 cm LA Vol (A4C):   54.4 ml 26.24 ml/m  RA Volume:   36.60 ml  17.66 ml/m LA Biplane Vol: 71.4 ml 34.44 ml/m  AORTIC VALVE AV Area (Vmax):    1.94 cm AV Area (Vmean):   2.01 cm AV Area (VTI):     2.18 cm AV Vmax:           134.50 cm/s AV Vmean:          88.400 cm/s AV VTI:            0.278 m AV Peak Grad:      7.2 mmHg AV Mean Grad:      4.0 mmHg LVOT Vmax:         82.90 cm/s LVOT Vmean:        56.600 cm/s LVOT VTI:          0.193 m LVOT/AV VTI ratio: 0.69  AORTA Ao Sinus diam: 3.50 cm Ao Asc diam:   3.50 cm MITRAL VALVE MV Area (  PHT): 2.22 cm    SHUNTS MV Decel Time: 342 msec    Systemic VTI:  0.19 m MV E velocity: 59.50 cm/s  Systemic Diam: 2.00 cm MV A velocity: 82.10 cm/s MV E/A ratio:  0.72 Belva Boyden MD Electronically signed by Belva Boyden MD Signature Date/Time: 03/12/2024/7:02:26 PM    Final      Assessment/Plan 1. Infrarenal abdominal aortic aneurysm (AAA) without  rupture (HCC) (Primary) Recommend:  The AAA is 5 cm and therefore should undergo repair. Patient is status post CT scan of the abdominal aorta. Base on evaluation of the CT scan by me the patient is a candidate for endovascular repair.    The patient will require cardiac clearance prior to stent graft placement.    The patient will continue antiplatelet therapy as prescribed (since the patient is undergoing endovascular repair as opposed to open repair) as well as aggressive management of hyperlipidemia. Exercise is encouraged.    The patient is reminded that lifetime routine surveillance is a necessity with an endograft.    The risks and benefits of AAA repair are reviewed with the patient.  All questions are answered.  Alternative therapies are also discussed.  The patient agrees to proceed with endovascular aneurysm repair to prevent lethal rupture.   Patient will follow-up with me in the office after the surgery.   2. PAD (peripheral artery disease) (HCC)  Recommend:   The patient has evidence of atherosclerosis of the lower extremities with claudication.  The patient does not voice lifestyle limiting changes at this point in time.   Noninvasive studies do not suggest clinically significant change.   No invasive studies, angiography or surgery at this time The patient should continue walking and begin a more formal exercise program.  The patient should continue antiplatelet therapy and aggressive treatment of the lipid abnormalities   No changes in the patient's medications at this time   Continued surveillance is indicated as atherosclerosis is likely to progress with time.     The patient will continue follow up with noninvasive studies as ordered.    3. Coronary arteriosclerosis The patient has been cleared by [facility] cardiology   Continue cardiac and antihypertensive medications as already ordered and reviewed, no changes at this time.   Continue statin as ordered and  reviewed, no changes at this time   Nitrates PRN for chest pain   4. Primary hypertension Continue antihypertensive medications as already ordered, these medications have been reviewed and there are no changes at this time.   5. Type 2 diabetes mellitus with other circulatory complication, without long-term current use of insulin (HCC) Continue hypoglycemic medications as already ordered, these medications have been reviewed and there are no changes at this time.   Hgb A1C to be monitored as already arranged by primary service     Devon Fogo, MD  04/03/2024 6:56 AM

## 2024-04-03 NOTE — Anesthesia Procedure Notes (Signed)
 Procedure Name: Intubation Date/Time: 04/03/2024 8:23 AM  Performed by: Donnamae Gaba, CRNAPre-anesthesia Checklist: Patient identified, Emergency Drugs available, Suction available and Patient being monitored Patient Re-evaluated:Patient Re-evaluated prior to induction Oxygen Delivery Method: Circle system utilized Preoxygenation: Pre-oxygenation with 100% oxygen Induction Type: IV induction Ventilation: Mask ventilation without difficulty Laryngoscope Size: McGrath and 4 Grade View: Grade I Tube type: Oral Tube size: 7.0 mm Number of attempts: 1 Airway Equipment and Method: Stylet Placement Confirmation: ETT inserted through vocal cords under direct vision, positive ETCO2 and breath sounds checked- equal and bilateral Secured at: 22 cm Tube secured with: Tape Dental Injury: Teeth and Oropharynx as per pre-operative assessment

## 2024-04-03 NOTE — Transfer of Care (Signed)
 Immediate Anesthesia Transfer of Care Note  Patient: Thomas Morgan  Procedure(s) Performed: ENDOVASCULAR REPAIR/STENT GRAFT  Patient Location: PACU  Anesthesia Type:General  Level of Consciousness: sedated  Airway & Oxygen Therapy: Patient Spontanous Breathing and Patient connected to face mask oxygen  Post-op Assessment: Report given to RN and Post -op Vital signs reviewed and stable  Post vital signs: Reviewed and stable  Last Vitals:  Vitals Value Taken Time  BP 127/69 04/03/24 1047  Temp    Pulse 73 04/03/24 1055  Resp 15 04/03/24 1055  SpO2 100 % 04/03/24 1055  Vitals shown include unfiled device data.  Last Pain:  Vitals:   04/03/24 0722  TempSrc: Oral  PainSc: 0-No pain         Complications: No notable events documented.

## 2024-04-03 NOTE — Progress Notes (Addendum)
 Patient able to sit up in bed at 1445. Diet order for carb mod placed for patient and meal ordered. Water to drink provided to patient. Wife at bedside.   Bilat groin sites assessed and WNL. Will continue to monitor closely and follow deflate/inflate protocol orders

## 2024-04-03 NOTE — Progress Notes (Signed)
 Patient awake/alert x4. S/p endovascular repair/stent graft. Bilateral groin sites. Right groin with safeguard 40ml air. Left groin with staples/honeycomb dressing with old drainage: marked.  Pre-op doppler pulses, post-op doppler pulses. Medicated for discomfort as ordered. NPO for 4hrs till 1430, also leg straight till 1430, patient aware of latter. Family updated.

## 2024-04-04 ENCOUNTER — Encounter: Payer: Self-pay | Admitting: Vascular Surgery

## 2024-04-04 DIAGNOSIS — Z95828 Presence of other vascular implants and grafts: Secondary | ICD-10-CM

## 2024-04-04 DIAGNOSIS — Z9889 Other specified postprocedural states: Secondary | ICD-10-CM

## 2024-04-04 LAB — BASIC METABOLIC PANEL WITH GFR
Anion gap: 7 (ref 5–15)
BUN: 12 mg/dL (ref 8–23)
CO2: 23 mmol/L (ref 22–32)
Calcium: 7.7 mg/dL — ABNORMAL LOW (ref 8.9–10.3)
Chloride: 111 mmol/L (ref 98–111)
Creatinine, Ser: 0.78 mg/dL (ref 0.61–1.24)
GFR, Estimated: >60 mL/min (ref >60)
Glucose, Bld: 150 mg/dL — ABNORMAL HIGH (ref 70–99)
Potassium: 3.9 mmol/L (ref 3.5–5.1)
Sodium: 141 mmol/L (ref 135–145)

## 2024-04-04 LAB — CBC
HCT: 27.3 % — ABNORMAL LOW (ref 39.0–52.0)
Hemoglobin: 9.1 g/dL — ABNORMAL LOW (ref 13.0–17.0)
MCH: 31.8 pg (ref 26.0–34.0)
MCHC: 33.3 g/dL (ref 30.0–36.0)
MCV: 95.5 fL (ref 80.0–100.0)
Platelets: 249 10*3/uL (ref 150–400)
RBC: 2.86 MIL/uL — ABNORMAL LOW (ref 4.22–5.81)
RDW: 13 % (ref 11.5–15.5)
WBC: 15.6 10*3/uL — ABNORMAL HIGH (ref 4.0–10.5)
nRBC: 0 % (ref 0.0–0.2)

## 2024-04-04 LAB — GLUCOSE, CAPILLARY
Glucose-Capillary: 123 mg/dL — ABNORMAL HIGH (ref 70–99)
Glucose-Capillary: 171 mg/dL — ABNORMAL HIGH (ref 70–99)

## 2024-04-04 MED ORDER — OXYCODONE-ACETAMINOPHEN 5-325 MG PO TABS: 1.0000 | ORAL_TABLET | ORAL | 0 refills | Status: DC | PRN

## 2024-04-04 MED ORDER — IODIXANOL 320 MG/ML IV SOLN
INTRAVENOUS | Status: DC | PRN
  Administered 2024-04-03: 55 mL

## 2024-04-04 MED ORDER — CLOPIDOGREL BISULFATE 75 MG PO TABS
75.0000 mg | ORAL_TABLET | Freq: Every day | ORAL | Status: DC
  Administered 2024-04-04: 75 mg via ORAL
  Filled 2024-04-04: qty 1

## 2024-04-04 MED ORDER — HEPARIN (PORCINE) IN NACL 1000-0.9 UT/500ML-% IV SOLN
INTRAVENOUS | Status: DC | PRN
  Administered 2024-04-03: 1500 mL

## 2024-04-04 MED ORDER — VISTASEAL 4 ML SINGLE DOSE KIT
PACK | CUTANEOUS | Status: DC | PRN
  Administered 2024-04-03: 4 mL via TOPICAL

## 2024-04-04 MED ORDER — CLOPIDOGREL BISULFATE 75 MG PO TABS: 75.0000 mg | ORAL_TABLET | Freq: Every day | ORAL | 11 refills | Status: AC

## 2024-04-04 MED ORDER — HEMOSTATIC AGENTS (NO CHARGE) OPTIME
TOPICAL | Status: DC | PRN
  Administered 2024-04-03: 1 via TOPICAL

## 2024-04-04 NOTE — Plan of Care (Signed)
  Problem: Education: Goal: Knowledge of discharge needs will improve Outcome: Progressing   Problem: Clinical Measurements: Goal: Postoperative complications will be avoided or minimized Outcome: Progressing   Problem: Respiratory: Goal: Will achieve and/or maintain a regular respiratory rate, without signs or symptoms of dyspnea Outcome: Progressing   Problem: Skin Integrity: Goal: Demonstration of wound healing without infection will improve Outcome: Progressing

## 2024-04-04 NOTE — Anesthesia Postprocedure Evaluation (Signed)
 Anesthesia Post Note  Patient: Thomas Morgan  Procedure(s) Performed: ENDOVASCULAR REPAIR/STENT GRAFT  Patient location during evaluation: PACU Anesthesia Type: General Level of consciousness: awake and alert, oriented and patient cooperative Pain management: pain level controlled Vital Signs Assessment: post-procedure vital signs reviewed and stable Respiratory status: spontaneous breathing, nonlabored ventilation and respiratory function stable Cardiovascular status: blood pressure returned to baseline and stable Postop Assessment: adequate PO intake Anesthetic complications: no   No notable events documented.   Last Vitals:  Vitals:   04/04/24 0504 04/04/24 0617  BP: (!) 100/55 (!) 96/55  Pulse: 78 76  Resp: 20 (!) 26  Temp:    SpO2: 93% 93%    Last Pain:  Vitals:   04/04/24 0400  TempSrc: Oral  PainSc: Asleep                 Zamari Bonsall

## 2024-04-04 NOTE — Discharge Summary (Signed)
 [facility]    Discharge Summary    Patient ID:  Thomas Morgan MRN: [id number] DOB/AGE: 12/07/1950 73 y.o.  Admit date: 04/03/2024 Discharge date: 04/04/2024 Date of Surgery: 04/03/2024 Surgeon: Surgeon(s): Schnier, Ninette Basque, MD [employer], MD  Admission Diagnosis: Abdominal aortic aneurysm (AAA) without rupture, unspecified part (HCC) [I71.40] AAA (abdominal aortic aneurysm) without rupture (HCC) [I71.40]  Discharge Diagnoses:  Abdominal aortic aneurysm (AAA) without rupture, unspecified part (HCC) [I71.40] AAA (abdominal aortic aneurysm) without rupture (HCC) [I71.40]  Secondary Diagnoses: Past Medical History:  Diagnosis Date   Allergy    Arthritis    Atherosclerosis of aorta (HCC)    Basal cell carcinoma    CAD (coronary artery disease)    Complication of anesthesia    a.) PONV   Diastolic dysfunction    Diverticulosis    DM (diabetes mellitus), type 2 (HCC)    ED (erectile dysfunction)    Emphysema of lung (HCC)    GERD (gastroesophageal reflux disease)    Hepatic steatosis    History of adenomatous polyp of colon    HTN (hypertension)    Hyperlipidemia    Infrarenal abdominal aortic aneurysm (AAA) without rupture (HCC)    Long-term use of aspirin  therapy    Lumbar canal stenosis    PAD (peripheral artery disease) (HCC)    PONV (postoperative nausea and vomiting)    Psoriasis    Tobacco abuse    Ventral hernia    Vitamin D  deficiency     Procedure(s): ENDOVASCULAR REPAIR/STENT GRAFT  Discharged Condition: good  HPI:  Thomas Morgan is a 73 y.o. y.o. male who presents with presents with an abdominal aortic aneurysm that is greater than 5 cm placing him at risk for lethal rupture.    PROCEDURE: US  guidance for vascular access, bilateral femoral arteries Catheter placement into aorta from bilateral femoral approaches Placement of a 31 x 14 x 13 Gore Excluder Endoprosthesis main body with a 16 x 10 ipsilateral extension and  a 20 x 10 contralateral limb ProGlide closure devices bilateral femoral arteries successful on the right unsuccessful on the left. Cut down left common femoral artery with direct repair of the vessel  Patient is being discharged today on aspirin  81 mg daily, Plavix 75 mg daily and Mevacor  40 mg daily.  Patient is counseled and instructed not to miss any of these medications as it will affect the outcome of his procedure.  Patient verbalizes understanding.  I spent greater than 60 minutes preparing discharge.  Bedside teaching was done as well as wound care.  Hospital Course:  Thomas Morgan is a 73 y.o. male is S/P AAA Endovascular Stent placement for AAA repair.  Extubated: POD # 0 Physical Exam:  Alert notes x3, no acute distress Face: Symmetrical.  Tongue is midline. Neck: Trachea is midline.  No swelling or bruising. Cardiovascular: Regular rate and rhythm Pulmonary: Clear to auscultation bilaterally Abdomen: Soft, nontender, nondistended Right groin access: Clean dry and intact.  No swelling or drainage noted Left groin access: Clean dry and intact with staples.  No swelling or drainage noted Left lower extremity: Thigh soft.  Calf soft.  Extremities warm distally toes.  Hard to palpate pedal pulses however the foot is warm is her good capillary refill. Right lower extremity: Thigh soft.  Calf soft.  Extremities warm distally toes.  Hard to palpate pedal pulses however the foot is warm is her good capillary refill. Neurological: No deficits noted   Post-op wounds:  clean, dry, intact or healing well  Pt. Ambulating, voiding and taking PO diet without difficulty. Pt pain controlled with PO pain meds.  Labs:  As below  Complications: none  Consults:    Significant Diagnostic Studies: CBC Lab Results  Component Value Date   WBC 15.6 (H) 04/04/2024   HGB 9.1 (L) 04/04/2024   HCT 27.3 (L) 04/04/2024   MCV 95.5 04/04/2024   PLT 249 04/04/2024    BMET    Component  Value Date/Time   NA 141 04/04/2024 0333   NA 142 11/28/2023 0852   K 3.9 04/04/2024 0333   CL 111 04/04/2024 0333   CO2 23 04/04/2024 0333   GLUCOSE 150 (H) 04/04/2024 0333   BUN 12 04/04/2024 0333   BUN 15 11/28/2023 0852   CREATININE 0.78 04/04/2024 0333   CALCIUM  7.7 (L) 04/04/2024 0333   GFRNONAA >60 04/04/2024 0333   GFRAA 108 04/10/2020 1456   COAG Lab Results  Component Value Date   INR 1.2 04/03/2024     Disposition:  Discharge to :Home  Allergies as of 04/04/2024       Reactions   Hydrocodone Other (See Comments)   Palpitations, increased heart rate   Statins    Atorvastatin     Aching and feeling down   Crestor [rosuvastatin] Other (See Comments)   Muscle aches   Lescol  [fluvastatin]    Muscle Aches        Medication List     TAKE these medications    aspirin  EC 81 MG tablet Take 1 tablet (81 mg total) by mouth daily. Swallow whole.   clopidogrel 75 MG tablet Commonly known as: Plavix Take 1 tablet (75 mg total) by mouth daily.   ezetimibe  10 MG tablet Commonly known as: ZETIA  Take 1 tablet (10 mg total) by mouth daily. What changed: when to take this   gabapentin  300 MG capsule Commonly known as: NEURONTIN  Take 300mg  AM, 300mg  PM, 900mg  QHS What changed:  how much to take how to take this when to take this additional instructions   hydrochlorothiazide  50 MG tablet Commonly known as: HYDRODIURIL  TAKE 1 TABLET BY MOUTH EVERY DAY What changed: when to take this   lovastatin  40 MG tablet Commonly known as: MEVACOR  Take 40 mg by mouth every morning.   omeprazole  40 MG capsule Commonly known as: PRILOSEC Take 1 capsule (40 mg total) by mouth daily. What changed: when to take this   OneTouch Delica Lancets 33G Misc Use to check blood sugar once a day.  DX: E11.9   OneTouch Verio test strip Generic drug: glucose blood TEST ONCE DAILY  Dx: E11.9   oxyCODONE -acetaminophen  5-325 MG tablet Commonly known as: PERCOCET/ROXICET Take  1-2 tablets by mouth every 4 (four) hours as needed for moderate pain (pain score 4-6) or severe pain (pain score 7-10) (fgbdh).   traMADol  50 MG tablet Commonly known as: ULTRAM  Take 1-2 tablets (50-100 mg total) by mouth at bedtime as needed.   VITAMIN C PO Take 1 tablet by mouth daily at 6 (six) AM.   VITAMIN D -3 PO Take 1 tablet by mouth daily.       Verbal and written Discharge instructions given to the patient. Wound care per Discharge AVS  Follow-up Information     Schnier, Ninette Basque, MD Follow up in 3 week(s).   Specialties: Vascular Surgery, Cardiology, Radiology, Vascular Surgery Why: EVAR with left groin staple removal Contact information: [address] [place] [address] [phone]  Signed: Annamaria Barrette, NP  04/04/2024, 12:59 PM

## 2024-04-04 NOTE — Discharge Instructions (Signed)
 Vascular Surgery Discharge instructions:  Do not lift anything heavy for the next 3 weeks.  Do not lift anything more than a gallon of milk.  Do not drive until your staples in your left groin are removed.  You may shower on 04/06/2024.  Shower with the old dressings in place and immediately remove them after showering.  Pat both your groins completely dry.  In your right groin place a Band-Aid over the insertion site for the next 3 days.  In your left groin pain at the staples with Betadine  sticks once daily after you shower.  Let soap and water run over the staples but do not scrub them.  They will be removed in 3 weeks.  You are being discharged on aspirin  81 mg daily, Plavix 75 mg daily.  Please do not miss or skip any of these medications as it will alter the outcome of your procedure.  Follow-up with vein and vascular surgery as scheduled.

## 2024-04-05 ENCOUNTER — Encounter: Payer: Self-pay | Admitting: Vascular Surgery

## 2024-04-07 ENCOUNTER — Emergency Department

## 2024-04-07 ENCOUNTER — Emergency Department
Admission: EM | Admit: 2024-04-07 | Discharge: 2024-04-07 | Disposition: A | Discharge status: Home / Self Care | Attending: Emergency Medicine | Admitting: Emergency Medicine

## 2024-04-07 ENCOUNTER — Other Ambulatory Visit: Payer: Self-pay

## 2024-04-07 DIAGNOSIS — D72829 Elevated white blood cell count, unspecified: Secondary | ICD-10-CM | POA: Diagnosis not present

## 2024-04-07 DIAGNOSIS — R110 Nausea: Secondary | ICD-10-CM | POA: Diagnosis not present

## 2024-04-07 DIAGNOSIS — R630 Anorexia: Secondary | ICD-10-CM | POA: Diagnosis not present

## 2024-04-07 DIAGNOSIS — R531 Weakness: Secondary | ICD-10-CM | POA: Insufficient documentation

## 2024-04-07 DIAGNOSIS — I7143 Infrarenal abdominal aortic aneurysm, without rupture: Secondary | ICD-10-CM | POA: Diagnosis not present

## 2024-04-07 DIAGNOSIS — R0602 Shortness of breath: Secondary | ICD-10-CM | POA: Diagnosis not present

## 2024-04-07 DIAGNOSIS — R1111 Vomiting without nausea: Secondary | ICD-10-CM | POA: Diagnosis not present

## 2024-04-07 DIAGNOSIS — I714 Abdominal aortic aneurysm, without rupture, unspecified: Secondary | ICD-10-CM | POA: Diagnosis not present

## 2024-04-07 DIAGNOSIS — R188 Other ascites: Secondary | ICD-10-CM | POA: Diagnosis not present

## 2024-04-07 DIAGNOSIS — K8020 Calculus of gallbladder without cholecystitis without obstruction: Secondary | ICD-10-CM | POA: Diagnosis not present

## 2024-04-07 DIAGNOSIS — J439 Emphysema, unspecified: Secondary | ICD-10-CM | POA: Diagnosis not present

## 2024-04-07 LAB — URINALYSIS, ROUTINE W REFLEX MICROSCOPIC
Bilirubin Urine: NEGATIVE
Glucose, UA: NEGATIVE mg/dL
Hgb urine dipstick: NEGATIVE
Ketones, ur: NEGATIVE mg/dL
Leukocytes,Ua: NEGATIVE
Nitrite: NEGATIVE
Protein, ur: NEGATIVE mg/dL
Specific Gravity, Urine: 1.01 (ref 1.005–1.030)
pH: 8 (ref 5.0–8.0)

## 2024-04-07 LAB — COMPREHENSIVE METABOLIC PANEL WITH GFR
ALT: 18 U/L (ref 0–44)
AST: 21 U/L (ref 15–41)
Albumin: 3.5 g/dL (ref 3.5–5.0)
Alkaline Phosphatase: 53 U/L (ref 38–126)
Anion gap: 14 (ref 5–15)
BUN: 15 mg/dL (ref 8–23)
CO2: 21 mmol/L — ABNORMAL LOW (ref 22–32)
Calcium: 8.5 mg/dL — ABNORMAL LOW (ref 8.9–10.3)
Chloride: 102 mmol/L (ref 98–111)
Creatinine, Ser: 0.62 mg/dL (ref 0.61–1.24)
GFR, Estimated: >60 mL/min
Glucose, Bld: 141 mg/dL — ABNORMAL HIGH (ref 70–99)
Potassium: 3.9 mmol/L (ref 3.5–5.1)
Sodium: 137 mmol/L (ref 135–145)
Total Bilirubin: 1.3 mg/dL — ABNORMAL HIGH (ref 0.0–1.2)
Total Protein: 6.2 g/dL — ABNORMAL LOW (ref 6.5–8.1)

## 2024-04-07 LAB — CBC WITH DIFFERENTIAL/PLATELET
Abs Immature Granulocytes: 0.1 K/uL — ABNORMAL HIGH (ref 0.00–0.07)
Basophils Absolute: 0.1 K/uL (ref 0.0–0.1)
Basophils Relative: 1 %
Eosinophils Absolute: 0.8 K/uL — ABNORMAL HIGH (ref 0.0–0.5)
Eosinophils Relative: 5 %
HCT: 30.3 % — ABNORMAL LOW (ref 39.0–52.0)
Hemoglobin: 10.2 g/dL — ABNORMAL LOW (ref 13.0–17.0)
Immature Granulocytes: 1 %
Lymphocytes Relative: 8 %
Lymphs Abs: 1.2 K/uL (ref 0.7–4.0)
MCH: 32.3 pg (ref 26.0–34.0)
MCHC: 33.7 g/dL (ref 30.0–36.0)
MCV: 95.9 fL (ref 80.0–100.0)
Monocytes Absolute: 0.8 K/uL (ref 0.1–1.0)
Monocytes Relative: 6 %
Neutro Abs: 11.8 K/uL — ABNORMAL HIGH (ref 1.7–7.7)
Neutrophils Relative %: 79 %
Platelets: 306 K/uL (ref 150–400)
RBC: 3.16 MIL/uL — ABNORMAL LOW (ref 4.22–5.81)
RDW: 13.1 % (ref 11.5–15.5)
WBC: 14.8 K/uL — ABNORMAL HIGH (ref 4.0–10.5)
nRBC: 0 % (ref 0.0–0.2)

## 2024-04-07 LAB — RESP PANEL BY RT-PCR (RSV, FLU A&B, COVID)  RVPGX2
Influenza A by PCR: NEGATIVE
Influenza B by PCR: NEGATIVE
Resp Syncytial Virus by PCR: NEGATIVE
SARS Coronavirus 2 by RT PCR: NEGATIVE

## 2024-04-07 LAB — TROPONIN I (HIGH SENSITIVITY)
Troponin I (High Sensitivity): 4 ng/L
Troponin I (High Sensitivity): 6 ng/L (ref <18)

## 2024-04-07 MED ORDER — IOHEXOL 350 MG/ML SOLN
100.0000 mL | Freq: Once | INTRAVENOUS | Status: AC | PRN
  Administered 2024-04-07: 100 mL via INTRAVENOUS

## 2024-04-07 MED ORDER — ONDANSETRON HCL 4 MG/2ML IJ SOLN
4.0000 mg | Freq: Once | INTRAMUSCULAR | Status: AC
  Administered 2024-04-07: 4 mg via INTRAVENOUS
  Filled 2024-04-07: qty 2

## 2024-04-07 MED ORDER — SODIUM CHLORIDE 0.9 % IV BOLUS
1000.0000 mL | Freq: Once | INTRAVENOUS | Status: AC
  Administered 2024-04-07: 1000 mL via INTRAVENOUS

## 2024-04-07 MED ORDER — ONDANSETRON HCL 4 MG PO TABS: 4.0000 mg | ORAL_TABLET | Freq: Four times a day (QID) | ORAL | 0 refills | Status: AC | PRN

## 2024-04-07 NOTE — ED Provider Notes (Signed)
 Care of this patient assumed from prior physician at 1500 pending CT, urinalysis, second creatinine, and disposition. Please see prior physician note for further details.  Briefly this is a 73 year old male with recent AAA repair on 6/11 presenting with nausea, weakness, shortness of breath starting this morning.  Labs with downtrending leukocytosis, stable anemia, CMP without critical derangements.  Negative initial troponin.  EKG without acute ischemic findings. CXR without focal consolidation. Repeat troponin, urinalysis, and results of CTA of the chest and CT abdomen pelvis pending at time of signout.   1622 CTA without evidence of PE or other acute intrathoracic finding.  CT abdomen pelvis demonstrated interval graft placement without evidence of endoleak.  Did note some hypodensities in the renal cortex concerning for cortical infarcts as well as a subcutaneous hematoma in the left inguinal area without evidence of active extravasation.  Urinalysis resulted without evidence of infection.  Repeat troponin normal.  On reassessment, patient feels much improved after IV fluids and nausea medication.  On exam, he does have a large palpable hematoma over his left inguinal area with healing areas of bruising and associated tenderness.  With recent AAA repair, will review with vascular surgery team.  1852 Delaying in call back from vascular surgery as Dr. Farrel Hones was in the OR. He did note that a hematoma is normal postoperatively, cortical infarct likely occurred during the procedure, but cannot be reversed at this point.  He does feel that the patient is stable for discharge home with outpatient follow-up.  Patient reassessed and updated.  He continues to remain comfortable with discharge home.  Strict return precautions provided.    Claria Crofts, MD 04/07/24 [phone]

## 2024-04-07 NOTE — ED Triage Notes (Signed)
 Pt brought in from by EMS for generalized weakness & nausea since this morning. Per pt, he feels as though he cannot get a deep breath. Pt denies any c/o pain. Pt states he had AAA repair on 04/03/2024; since then he has very little appetite, but today nausea has been worse.

## 2024-04-07 NOTE — ED Provider Notes (Signed)
 [facility] Provider Note    Event Date/Time   First MD Initiated Contact with Patient 04/07/24 1237     (approximate)   History   Fatigue and Nausea   HPI  Thomas Morgan is a 73 y.o. male with history of AAA repair who comes in with concerns for nausea weakness shortness of breath decreased appetite.  He reports that symptoms started this morning.  He denies any falls or hitting his head.  He reports is not being able to get a deep breath and just not feeling well.  He denies any focal abdominal pain other than some pain where his groins was accessed but this is not new today.   I reviewed the discharge note from 04/04/2021 however patient was discharged on aspirin , Plavix , lovastatin .  Physical Exam   Triage Vital Signs: ED Triage Vitals [04/07/24 1241]  Encounter Vitals Group     BP      Girls Systolic BP Percentile      Girls Diastolic BP Percentile      Boys Systolic BP Percentile      Boys Diastolic BP Percentile      Pulse      Resp      Temp      Temp src      SpO2      Weight 200 lb 9.9 oz (91 kg)     Height 5' 10.5 (1.791 m)     Head Circumference      Peak Flow      Pain Score      Pain Loc      Pain Education      Exclude from Growth Chart     Most recent vital signs: Vitals:   04/07/24 1246 04/07/24 1330  BP: (!) 153/84 127/87  Pulse: 64 61  Resp: 10 12  Temp: 97.7 F (36.5 C)   SpO2: 95% 96%     General: Awake, no distress.  CV:  Good peripheral perfusion.  Resp:  Normal effort.  Clear lungs Abd:  No distention.  Nontender.  Staples in place in the left groin.  No significant erythema or warmth.  No signs Other:  No swelling   ED Results / Procedures / Treatments   Labs (all labs ordered are listed, but only abnormal results are displayed) Labs Reviewed  CBC WITH DIFFERENTIAL/PLATELET - Abnormal; Notable for the following components:      Result Value   WBC 14.8 (*)    RBC 3.16 (*)    Hemoglobin 10.2 (*)     HCT 30.3 (*)    Neutro Abs 11.8 (*)    Eosinophils Absolute 0.8 (*)    Abs Immature Granulocytes 0.10 (*)    All other components within normal limits  COMPREHENSIVE METABOLIC PANEL WITH GFR - Abnormal; Notable for the following components:   CO2 21 (*)    Glucose, Bld 141 (*)    Calcium  8.5 (*)    Total Protein 6.2 (*)    Total Bilirubin 1.3 (*)    All other components within normal limits  RESP PANEL BY RT-PCR (RSV, FLU A&B, COVID)  [id number]  URINALYSIS, ROUTINE W REFLEX MICROSCOPIC  TROPONIN I (HIGH SENSITIVITY)  TROPONIN I (HIGH SENSITIVITY)     EKG  My interpretation of EKG:  Normal sinus rate of 70 without any ST elevation or T wave inversions, normal intervals  RADIOLOGY I have reviewed the xray personally and interpreted no evidence of any edema  PROCEDURES:  Critical Care performed: No  .1-3 Lead EKG Interpretation  Performed by: Lubertha Rush, MD Authorized by: Lubertha Rush, MD     Interpretation: normal     ECG rate:  60   ECG rate assessment: normal     Rhythm: sinus rhythm     Ectopy: none     Conduction: normal      MEDICATIONS ORDERED IN ED: Medications  iohexol  (OMNIPAQUE ) 350 MG/ML injection 100 mL (has no administration in time range)  sodium chloride  0.9 % bolus 1,000 mL (1,000 mLs Intravenous New Bag/Given 04/07/24 1258)  ondansetron  (ZOFRAN ) injection 4 mg (4 mg Intravenous Given 04/07/24 1258)     IMPRESSION / MDM / ASSESSMENT AND PLAN / ED COURSE  I reviewed the triage vital signs and the nursing notes.   Patient's presentation is most consistent with acute presentation with potential threat to life or bodily function.   Patient comes in with shortness of breath nausea, vomiting, weakness.  Labs ordered evaluate for electrolyte abnormalities, AKI, ACS, UTI, COVID, flu  CBC shows stable hemoglobin.  White count is downtrending from 3 days ago.  COVID, flu were negative.  CMP shows normal creatinine.  Troponin was negative.  Will  get CT imaging given patient's generalized symptoms do not see thing obvious on neuroexam but will get CT head to make sure no evidence of intracranial hemorrhage CT PE given some pain with taking a deep breath and CT abdomen to ensure no issues with the recent surgery although he is got no pain or drop in hemoglobin to suggest leak.  2:41 PM patient is feeling much better now.  Finally able to get a better exam on patient and he is neuro intact he denies any headaches or falls.  He states that he is feeling much improved he just still has a little bit of pain with breathing.  He thinks that he was just dehydrated.  He really denies any significant discomfort in his abdomen other than the normal postoperative pain.  Discussed with patient we are going to hold off on a CT scan of his head given he is got no neurological symptoms.  Patient pending CT imaging  The patient is on the cardiac monitor to evaluate for evidence of arrhythmia and/or significant heart rate changes.      FINAL CLINICAL IMPRESSION(S) / ED DIAGNOSES   Final diagnoses:  Weakness     Rx / DC Orders   ED Discharge Orders     None        Note:  This document was prepared using Dragon voice recognition software and may include unintentional dictation errors.   Lubertha Rush, MD 04/07/24 [phone]

## 2024-04-07 NOTE — Discharge Instructions (Signed)
 You were seen in the ER today for evaluation of your nausea and weakness.  I am glad you are feeling better.  Your testing did not show emergency findings.  Please continue to follow-up with your vascular surgery team as directed.  I sent a prescription for nausea medication to your pharmacy that you can take as needed.  Return to the ER for new or worsening symptoms.

## 2024-04-08 ENCOUNTER — Encounter: Payer: Self-pay | Admitting: Vascular Surgery

## 2024-04-09 ENCOUNTER — Telehealth: Admitting: Physician Assistant

## 2024-04-09 DIAGNOSIS — J441 Chronic obstructive pulmonary disease with (acute) exacerbation: Secondary | ICD-10-CM

## 2024-04-09 DIAGNOSIS — R051 Acute cough: Secondary | ICD-10-CM

## 2024-04-09 MED ORDER — BUDESONIDE-FORMOTEROL FUMARATE 160-4.5 MCG/ACT IN AERO
2.0000 | INHALATION_SPRAY | Freq: Two times a day (BID) | RESPIRATORY_TRACT | 0 refills | Status: DC
Start: 2024-04-09 — End: 2024-06-03

## 2024-04-09 MED ORDER — AZITHROMYCIN 250 MG PO TABS: ORAL_TABLET | ORAL | 0 refills | Status: AC

## 2024-04-09 NOTE — Progress Notes (Signed)
   Thank you for the details you included in the comment boxes. Those details are very helpful in determining the best course of treatment for you and help us  to provide the best care. Because of your recent procedures and having a cough, we recommend that you schedule a Virtual Urgent Care video visit in order for the provider to better assess what is going on.  The provider will be able to give you a more accurate diagnosis and treatment plan if we can more freely discuss your symptoms and with the addition of a virtual examination.   If you change your visit to a video visit, we will bill your insurance (similar to an office visit) and you will not be charged for this e-Visit. You will be able to stay at home and speak with the first available [facility] advanced practice provider. The link to do a video visit is in the drop down Menu tab of your Welcome screen in MyChart.       I have spent 5 minutes in review of e-visit questionnaire, review and updating patient chart, medical decision making and response to patient.   Angelia Kelp, PA-C

## 2024-04-09 NOTE — Progress Notes (Signed)
 Virtual Visit Consent   Thomas Morgan, you are scheduled for a virtual visit with a [facility] provider today. Just as with appointments in the office, your consent must be obtained to participate. Your consent will be active for this visit and any virtual visit you may have with one of our providers in the next 365 days. If you have a MyChart account, a copy of this consent can be sent to you electronically.  As this is a virtual visit, video technology does not allow for your provider to perform a traditional examination. This may limit your provider's ability to fully assess your condition. If your provider identifies any concerns that need to be evaluated in person or the need to arrange testing (such as labs, EKG, etc.), we will make arrangements to do so. Although advances in technology are sophisticated, we cannot ensure that it will always work on either your end or our end. If the connection with a video visit is poor, the visit may have to be switched to a telephone visit. With either a video or telephone visit, we are not always able to ensure that we have a secure connection.  By engaging in this virtual visit, you consent to the provision of healthcare and authorize for your insurance to be billed (if applicable) for the services provided during this visit. Depending on your insurance coverage, you may receive a charge related to this service.  I need to obtain your verbal consent now. Are you willing to proceed with your visit today? Thomas Morgan has provided verbal consent on 04/09/2024 for a virtual visit (video or telephone). Angelia Kelp, PA-C  Date: 04/09/2024 10:00 AM   Virtual Visit via Video Note   I, Angelia Kelp, connected with  Thomas Morgan  ([id number], 10-26-1950) on 04/09/24 at  9:45 AM EDT by a video-enabled telemedicine application and verified that I am speaking with the correct person using two identifiers.  Location: Patient: Virtual Visit Location  Patient: Home Provider: Virtual Visit Location Provider: Home Office   I discussed the limitations of evaluation and management by telemedicine and the availability of in person appointments. The patient expressed understanding and agreed to proceed.    History of Present Illness: Thomas Morgan is a 73 y.o. who identifies as a male who was assigned male at birth, and is being seen today for cough and congestion.  HPI: URI  This is a new problem. The current episode started in the past 7 days (Started Sunday, 04/07/24). The problem has been unchanged. There has been no fever. Associated symptoms include congestion, coughing (productive of thin mucus), headaches, nausea (on sunday, now improved) and wheezing (mild increase from baseline). Pertinent negatives include no chest pain, diarrhea, ear pain, plugged ear sensation, rhinorrhea, sinus pain, sore throat or vomiting. He has tried nothing for the symptoms. The treatment provided no relief.  Had AAA stented on 04/03/24. Developed weakness and cough following. Went back to ER on 04/07/24. Chest CT angio and CT abd/pelvis overall unremarkable, no pneumonia noted, no endoleak of new stent. CXR normal. Covid/Flu/RSV testing was negative. CBC did show slight leukocytosis with a elevated neutrophil absolute at 11.8. HgB stable at 10.2. Diagnosed with dehydration. Given IV fluids and symptoms improved.  Reports since Sunday he cannot lay flat, wakes up feeling like he cannot catch his breath.  PMH: COPD, T2DM, CAD, current smoker  Problems:  Patient Active Problem List   Diagnosis Date Noted   AAA (abdominal aortic  aneurysm) without rupture (HCC) 04/03/2024   Paresthesia 11/14/2023   Gait abnormality 11/14/2023   Lumbar radiculopathy 06/18/2022   Abdominal aortic aneurysm without rupture (HCC) 05/02/2022   Leg pain 01/12/2021   PAD (peripheral artery disease) (HCC) 01/12/2021   Emphysema lung (HCC) 11/23/2020   Vitamin D  deficiency 10/05/2020    Lumbar canal stenosis 11/27/2019   Aortic aneurysm (HCC) 03/17/2017   Coronary arteriosclerosis 12/12/2016   Atherosclerosis of aorta (HCC) 11/14/2016   Numbness and tingling of foot 10/08/2015   Allergic rhinitis 04/24/2015   Back pain, chronic 04/24/2015   Diabetes (HCC) 04/24/2015   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   GERD (gastroesophageal reflux disease) 04/24/2015   Hypertension 04/24/2015   Obesity 04/24/2015   History of adenomatous polyp of colon 04/24/2015   Vasomotor rhinitis 04/24/2015   Ventral hernia 05/25/2010   Hyperlipidemia 10/12/2009   Arthralgia of multiple joints 10/12/2009   Hypersomnia 08/22/2008   Psoriasis 08/22/2008   Hepatic steatosis 06/06/2007   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   Tobacco abuse 10/24/1968    Allergies:  Allergies  Allergen Reactions   Hydrocodone Other (See Comments)    Palpitations, increased heart rate   Statins    Atorvastatin      Aching and feeling down   Crestor [Rosuvastatin] Other (See Comments)    Muscle aches   Lescol  [Fluvastatin]     Muscle Aches   Medications:  Current Outpatient Medications:    azithromycin (ZITHROMAX) 250 MG tablet, Take 2 tablets on day 1, then 1 tablet daily on days 2 through 5, Disp: 6 tablet, Rfl: 0   budesonide-formoterol (SYMBICORT) 160-4.5 MCG/ACT inhaler, Inhale 2 puffs into the lungs in the morning and at bedtime., Disp: 1 each, Rfl: 0   Ascorbic Acid (VITAMIN C PO), Take 1 tablet by mouth daily at 6 (six) AM., Disp: , Rfl:    aspirin  EC 81 MG tablet, Take 1 tablet (81 mg total) by mouth daily. Swallow whole., Disp: , Rfl:    Cholecalciferol (VITAMIN D -3 PO), Take 1 tablet by mouth daily., Disp: , Rfl:    clopidogrel  (PLAVIX ) 75 MG tablet, Take 1 tablet (75 mg total) by mouth daily., Disp: 30 tablet, Rfl: 11   ezetimibe  (ZETIA ) 10 MG tablet, Take 1 tablet (10 mg total) by mouth daily. (Patient taking differently: Take 10 mg by mouth every morning.), Disp: 90 tablet,  Rfl: 3   gabapentin  (NEURONTIN ) 300 MG capsule, Take 300mg  AM, 300mg  PM, 900mg  QHS (Patient taking differently: Take 300-600 mg by mouth 2 (two) times daily. Take 300mg  AM,  600mg  QHS), Disp: 450 capsule, Rfl: 3   glucose blood (ONETOUCH VERIO) test strip, TEST ONCE DAILY  Dx: E11.9, Disp: 100 each, Rfl: 3   hydrochlorothiazide  (HYDRODIURIL ) 50 MG tablet, TAKE 1 TABLET BY MOUTH EVERY DAY (Patient taking differently: Take 50 mg by mouth every morning.), Disp: 90 tablet, Rfl: 0   lovastatin  (MEVACOR ) 40 MG tablet, Take 40 mg by mouth every morning., Disp: , Rfl:    omeprazole  (PRILOSEC) 40 MG capsule, Take 1 capsule (40 mg total) by mouth daily. (Patient taking differently: Take 40 mg by mouth every morning.), Disp: 90 capsule, Rfl: 4   ondansetron  (ZOFRAN ) 4 MG tablet, Take 1 tablet (4 mg total) by mouth every 6 (six) hours as needed for up to 7 days for nausea or vomiting., Disp: 20 tablet, Rfl: 0   OneTouch Delica Lancets 33G MISC, Use to check blood sugar once a day.  DX: E11.9, Disp: 100 each,  Rfl: 3   oxyCODONE -acetaminophen  (PERCOCET/ROXICET) 5-325 MG tablet, Take 1-2 tablets by mouth every 4 (four) hours as needed for moderate pain (pain score 4-6) or severe pain (pain score 7-10) (fgbdh)., Disp: 30 tablet, Rfl: 0   traMADol  (ULTRAM ) 50 MG tablet, Take 1-2 tablets (50-100 mg total) by mouth at bedtime as needed., Disp: 60 tablet, Rfl: 2  Observations/Objective: Patient is well-developed, well-nourished in no acute distress.  Resting comfortably at home.  Head is normocephalic, atraumatic.  No labored breathing.  Speech is clear and coherent with logical content.  Patient is alert and oriented at baseline.    Assessment and Plan: 1. COPD with acute exacerbation (HCC) (Primary) - azithromycin (ZITHROMAX) 250 MG tablet; Take 2 tablets on day 1, then 1 tablet daily on days 2 through 5  Dispense: 6 tablet; Refill: 0 - budesonide-formoterol (SYMBICORT) 160-4.5 MCG/ACT inhaler; Inhale 2 puffs  into the lungs in the morning and at bedtime.  Dispense: 1 each; Refill: 0  - Worsening symptoms  - Will treat with Z-pack  - Symbicort added - Can continue Mucinex  as needed - Push fluids.  - Rest.  - Steam and humidifier can help - Seek in person evaluation if worsening or symptoms fail to improve    Follow Up Instructions: I discussed the assessment and treatment plan with the patient. The patient was provided an opportunity to ask questions and all were answered. The patient agreed with the plan and demonstrated an understanding of the instructions.  A copy of instructions were sent to the patient via MyChart unless otherwise noted below.    The patient was advised to call back or seek an in-person evaluation if the symptoms worsen or if the condition fails to improve as anticipated.    Angelia Kelp, PA-C

## 2024-04-09 NOTE — Patient Instructions (Signed)
 Thomas Morgan, thank you for joining Angelia Kelp, PA-C for today's virtual visit.  While this provider is not your primary care provider (PCP), if your PCP is located in our provider database this encounter information will be shared with them immediately following your visit.   A [facility] MyChart account gives you access to today's visit and all your visits, tests, and labs performed at [facility]  click here if you don't have a  MyChart account or go to [email or web address]  Consent: (Patient) Thomas Morgan provided verbal consent for this virtual visit at the beginning of the encounter.  Current Medications:  Current Outpatient Medications:    azithromycin (ZITHROMAX) 250 MG tablet, Take 2 tablets on day 1, then 1 tablet daily on days 2 through 5, Disp: 6 tablet, Rfl: 0   budesonide-formoterol (SYMBICORT) 160-4.5 MCG/ACT inhaler, Inhale 2 puffs into the lungs in the morning and at bedtime., Disp: 1 each, Rfl: 0   Ascorbic Acid (VITAMIN C PO), Take 1 tablet by mouth daily at 6 (six) AM., Disp: , Rfl:    aspirin  EC 81 MG tablet, Take 1 tablet (81 mg total) by mouth daily. Swallow whole., Disp: , Rfl:    Cholecalciferol (VITAMIN D -3 PO), Take 1 tablet by mouth daily., Disp: , Rfl:    clopidogrel  (PLAVIX ) 75 MG tablet, Take 1 tablet (75 mg total) by mouth daily., Disp: 30 tablet, Rfl: 11   ezetimibe  (ZETIA ) 10 MG tablet, Take 1 tablet (10 mg total) by mouth daily. (Patient taking differently: Take 10 mg by mouth every morning.), Disp: 90 tablet, Rfl: 3   gabapentin  (NEURONTIN ) 300 MG capsule, Take 300mg  AM, 300mg  PM, 900mg  QHS (Patient taking differently: Take 300-600 mg by mouth 2 (two) times daily. Take 300mg  AM,  600mg  QHS), Disp: 450 capsule, Rfl: 3   glucose blood (ONETOUCH VERIO) test strip, TEST ONCE DAILY  Dx: E11.9, Disp: 100 each, Rfl: 3   hydrochlorothiazide  (HYDRODIURIL ) 50 MG tablet, TAKE 1 TABLET BY MOUTH EVERY DAY (Patient taking  differently: Take 50 mg by mouth every morning.), Disp: 90 tablet, Rfl: 0   lovastatin  (MEVACOR ) 40 MG tablet, Take 40 mg by mouth every morning., Disp: , Rfl:    omeprazole  (PRILOSEC) 40 MG capsule, Take 1 capsule (40 mg total) by mouth daily. (Patient taking differently: Take 40 mg by mouth every morning.), Disp: 90 capsule, Rfl: 4   ondansetron  (ZOFRAN ) 4 MG tablet, Take 1 tablet (4 mg total) by mouth every 6 (six) hours as needed for up to 7 days for nausea or vomiting., Disp: 20 tablet, Rfl: 0   OneTouch Delica Lancets 33G MISC, Use to check blood sugar once a day.  DX: E11.9, Disp: 100 each, Rfl: 3   oxyCODONE -acetaminophen  (PERCOCET/ROXICET) 5-325 MG tablet, Take 1-2 tablets by mouth every 4 (four) hours as needed for moderate pain (pain score 4-6) or severe pain (pain score 7-10) (fgbdh)., Disp: 30 tablet, Rfl: 0   traMADol  (ULTRAM ) 50 MG tablet, Take 1-2 tablets (50-100 mg total) by mouth at bedtime as needed., Disp: 60 tablet, Rfl: 2   Medications ordered in this encounter:  Meds ordered this encounter  Medications   azithromycin (ZITHROMAX) 250 MG tablet    Sig: Take 2 tablets on day 1, then 1 tablet daily on days 2 through 5    Dispense:  6 tablet    Refill:  0    Supervising Provider:   LAMPTEY, PHILIP O [[id number]]   budesonide-formoterol (SYMBICORT) 160-4.5 MCG/ACT inhaler  Sig: Inhale 2 puffs into the lungs in the morning and at bedtime.    Dispense:  1 each    Refill:  0    Supervising Provider:   Corine Dice [id number]     *If you need refills on other medications prior to your next appointment, please contact your pharmacy*  Follow-Up: Call back or seek an in-person evaluation if the symptoms worsen or if the condition fails to improve as anticipated.  [facility] [phone]  Other Instructions  Chronic Obstructive Pulmonary Disease Exacerbation  Chronic obstructive pulmonary disease (COPD) is a long-term (chronic) lung problem. When you  have COPD, it can feel harder to breathe in or out. COPD exacerbation is a flare-up of symptoms when breathing gets worse and more treatment may be needed. Without treatment, flare-ups can be life-threatening. If they happen often, your lungs can become more damaged. What are the causes? Not taking your usual COPD medicines as told by your health care provider. A cold or the flu, which can cause infection in your lungs. Being exposed to things that make your breathing worse, such as: Smoke. Air pollution. Fumes. Dust. Allergies. Weather changes. What are the signs or symptoms? Symptoms do not get better or get worse even if you take your medicines as told by your provider. Symptoms may include: More shortness of breath. You may only be able to speak one or two words at a time. More coughing or mucus from your lungs. More wheezing or chest tightness. Being more tired and having less energy. Confusion. How is this diagnosed? This condition is diagnosed based on: Symptoms that get worse. Your medical history. A physical exam. You may also have tests, including: A chest X-ray. Blood or mucus tests. How is this treated? You may be able to stay home or you may need to go to the hospital. Treatment may include: Taking medicines. These may include: Inhalers. These have medicines in them that you breathe in. These may be more of what you already take or they may be new. Steroids. These reduce inflammation in the airways. These may be inhaled, taken by mouth, or given in an IV. Antibiotics. These treat infection. Using oxygen. Using a device to help you clear mucus. Follow these instructions at home: Medicines Take your medicines only as told by your provider. If you were given antibiotics or steroids, take them as told by your provider. Do not stop taking them even if you start to feel better. Lifestyle Several times a day, wash your hands with soap and water for at least 20  seconds. If you cannot use soap and water, use hand sanitizer. This may help keep you from getting an infection. Avoid being around crowds or people who are sick. Do not smoke or use any products that contain nicotine or tobacco. If you need help quitting, ask your provider. Return to your normal activities when your provider says that it's safe. Use breathing methods to control your stress and catch your breath. How is this prevented? Follow your COPD action plan. The action plan tells you what to do if you're feeling good and what to do when you start feeling worse. Discuss the plan often with your provider. Make sure you get all the shots, also called vaccines, that your provider recommends. Ask your provider about a flu shot and a pneumonia shot. Use oxygen therapy if told by your provider. If you need home oxygen therapy, ask your provider how often to check your  oxygen level with a device called an oximeter. Keep all follow-up visits to review your COPD action plan. Your provider will want to check on your condition often to keep you healthy and out of the hospital. Contact a health care provider if: Your COPD symptoms get worse. You have a fever or chills. You have trouble doing daily activities. You have trouble breathing even when you are resting. Get help right away if: You are short of breath and cannot: Talk in full sentences. Do normal activities. You have chest pain. You feel confused. These symptoms may be an emergency. Call 911 right away. Do not wait to see if the symptoms will go away. Do not drive yourself to the hospital. This information is not intended to replace advice given to you by your health care provider. Make sure you discuss any questions you have with your health care provider. Document Revised: 07/13/2023 Document Reviewed: 12/26/2022 Elsevier Patient Education  2024 [employer]   If you have been instructed to have an in-person evaluation today at a  local Urgent Care facility, please use the link below. It will take you to a list of all of our available [facility], including address, phone number and hours of operation. Please do not delay care.  [facility]  If you or a family member do not have a primary care provider, use the link below to schedule a visit and establish care. When you choose a [facility] primary care physician or advanced practice provider, you gain a long-term partner in health. Find a Primary Care Provider  Learn more about [facility]'s in-office and virtual care options: [facility] - Get Care Now

## 2024-04-19 ENCOUNTER — Telehealth (INDEPENDENT_AMBULATORY_CARE_PROVIDER_SITE_OTHER): Payer: Self-pay

## 2024-04-19 NOTE — Telephone Encounter (Signed)
 I called and discussed with patient, this is to be expected because he had a cut down on the left lower extremity.  Currently the patient is on gabapentin  and he is advised to continue with this as well as his current pain medication.  Will follow the patient when he follows up on the 10th

## 2024-04-19 NOTE — Telephone Encounter (Signed)
 Patient left a message stating that he been having burning sensation with left leg since AAA repair done on 04/03/24. Patient stated that yesterday the burning sensation was worst. Please Advise

## 2024-04-22 ENCOUNTER — Other Ambulatory Visit: Payer: Self-pay | Admitting: Family Medicine

## 2024-04-22 DIAGNOSIS — I10 Essential (primary) hypertension: Secondary | ICD-10-CM

## 2024-05-01 ENCOUNTER — Other Ambulatory Visit (INDEPENDENT_AMBULATORY_CARE_PROVIDER_SITE_OTHER): Payer: Self-pay | Admitting: Vascular Surgery

## 2024-05-01 DIAGNOSIS — I714 Abdominal aortic aneurysm, without rupture, unspecified: Secondary | ICD-10-CM

## 2024-05-02 ENCOUNTER — Ambulatory Visit (INDEPENDENT_AMBULATORY_CARE_PROVIDER_SITE_OTHER)

## 2024-05-02 ENCOUNTER — Encounter (INDEPENDENT_AMBULATORY_CARE_PROVIDER_SITE_OTHER): Payer: Self-pay | Admitting: Nurse Practitioner

## 2024-05-02 ENCOUNTER — Ambulatory Visit (INDEPENDENT_AMBULATORY_CARE_PROVIDER_SITE_OTHER): Admitting: Nurse Practitioner

## 2024-05-02 VITALS — BP 151/83 | HR 67 | Ht 70.0 in | Wt 188.0 lb

## 2024-05-02 DIAGNOSIS — I714 Abdominal aortic aneurysm, without rupture, unspecified: Secondary | ICD-10-CM

## 2024-05-02 DIAGNOSIS — I7143 Infrarenal abdominal aortic aneurysm, without rupture: Secondary | ICD-10-CM

## 2024-05-02 DIAGNOSIS — I10 Essential (primary) hypertension: Secondary | ICD-10-CM

## 2024-05-02 NOTE — Progress Notes (Signed)
 Subjective:    Patient ID: Thomas Morgan, male    DOB: 11/23/1950, 73 y.o.   MRN: [id number] Chief Complaint  Patient presents with   Follow-Ups: Follow up with Schnier, Cordella MATSU, MD (Vascular    The patient returns to the office for surveillance of an abdominal aortic aneurysm status post stent graft placement on 04/04/2024.   Procedure:  PROCEDURE: 1. US  guidance for vascular access, bilateral femoral arteries 2. Catheter placement into aorta from bilateral femoral approaches 3. Placement of a 31 mm diameter proximal Gore Excluder Endoprosthesis main body left with a 20 mm diameter by 10 cm length right iliac contralateral limb 4. Placement of a 16 mm diameter by 10 cm length left iliac limb extension 5. ProGlide closure devices bilateral femoral arteries 6. Left femoral artery cutdown for repair of left common femoral artery   Patient denies abdominal pain or back pain, no other abdominal complaints.  No symptoms consistent with distal embolization No changes in claudication distance or new rest pain symptoms. No interval development of new ulcers or wounds.  His groin is healing well with no evidence of dehiscence.    There have been no significant interval changes in his overall healthcare since his last visit.   Patient denies amaurosis fugax or TIA symptoms.  The patient denies recent episodes of angina or shortness of breath.   Duplex US  of the aorta and iliac arteries shows a 4.70 AAA sac with no endoleak, decrease in the sac compared to the previous study.    Review of Systems  Skin:  Positive for wound.  Neurological:  Positive for numbness.  All other systems reviewed and are negative.      Objective:   Physical Exam Vitals reviewed.  HENT:     Head: Normocephalic.  Cardiovascular:     Rate and Rhythm: Normal rate.  Pulmonary:     Effort: Pulmonary effort is normal.  Skin:    General: Skin is warm and dry.  Neurological:     Mental Status: He is alert  and oriented to person, place, and time.  Psychiatric:        Mood and Affect: Mood normal.        Behavior: Behavior normal.        Thought Content: Thought content normal.        Judgment: Judgment normal.     BP (!) 151/83   Pulse 67   Ht 5' 10 (1.778 m)   Wt 188 lb (85.3 kg)   BMI 26.98 kg/m   Past Medical History:  Diagnosis Date   Allergy    Arthritis    Atherosclerosis of aorta (HCC)    Basal cell carcinoma    CAD (coronary artery disease)    Complication of anesthesia    a.) PONV   Diastolic dysfunction    Diverticulosis    DM (diabetes mellitus), type 2 (HCC)    ED (erectile dysfunction)    Emphysema of lung (HCC)    GERD (gastroesophageal reflux disease)    Hepatic steatosis    History of adenomatous polyp of colon    HTN (hypertension)    Hyperlipidemia    Infrarenal abdominal aortic aneurysm (AAA) without rupture (HCC)    Long-term use of aspirin  therapy    Lumbar canal stenosis    PAD (peripheral artery disease) (HCC)    PONV (postoperative nausea and vomiting)    Psoriasis    Tobacco abuse    Ventral hernia    Vitamin  D deficiency     Social History   Socioeconomic History   Marital status: Married    Spouse name: Not on file   Number of children: 2   Years of education: Not on file   Highest education level: Associate degree: occupational, Scientist, product/process development, or vocational program  Occupational History   Not on file  Tobacco Use   Smoking status: Every Day    Current packs/day: 1.00    Average packs/day: 1 pack/day for 48.0 years (48.0 ttl pk-yrs)    Types: Cigarettes   Smokeless tobacco: Never   Tobacco comments:    1 pack a day,   Vaping Use   Vaping status: Never Used  Substance and Sexual Activity   Alcohol use: No   Drug use: No   Sexual activity: Not Currently    Birth control/protection: None  Other Topics Concern   Not on file  Social History Narrative   Not on file   Social Drivers of Health   Financial Resource Strain: Low  Risk  (11/24/2023)   Overall Financial Resource Strain (CARDIA)    Difficulty of Paying Living Expenses: Not very hard  Food Insecurity: No Food Insecurity (04/03/2024)   Hunger Vital Sign    Worried About Running Out of Food in the Last Year: Never true    Ran Out of Food in the Last Year: Never true  Transportation Needs: No Transportation Needs (04/03/2024)   PRAPARE - Administrator, Civil Service (Medical): No    Lack of Transportation (Non-Medical): No  Physical Activity: Unknown (11/24/2023)   Exercise Vital Sign    Days of Exercise per Week: 0 days    Minutes of Exercise per Session: Not on file  Stress: No Stress Concern Present (11/24/2023)   [employer] of Occupational Health - Occupational Stress Questionnaire    Feeling of Stress : Not at all  Social Connections: Socially Integrated (04/03/2024)   Social Connection and Isolation Panel    Frequency of Communication with Friends and Family: Three times a week    Frequency of Social Gatherings with Friends and Family: More than three times a week    Attends Religious Services: More than 4 times per year    Active Member of [employer] or Organizations: Yes    Attends Banker Meetings: More than 4 times per year    Marital Status: Married  Catering manager Violence: Not At Risk (04/03/2024)   Humiliation, Afraid, Rape, and Kick questionnaire    Fear of Current or Ex-Partner: No    Emotionally Abused: No    Physically Abused: No    Sexually Abused: No    Past Surgical History:  Procedure Laterality Date   BACK SURGERY     BASAL CELL CARCINOMA EXCISION Left 2020   left arm   CYST REMOVAL TRUNK  2020   on his back   ELBOW SURGERY Left    ENDOVASCULAR REPAIR/STENT GRAFT N/A 04/03/2024   Procedure: ENDOVASCULAR REPAIR/STENT GRAFT;  Surgeon: Jama Cordella MATSU, MD;  Location: [facility];  Service: Cardiovascular;  Laterality: N/A;   EXCISION MORTON'S NEUROMA Left 03/24/2017   Procedure:  EXCISION MORTON'S NEUROMA;  Surgeon: Neill Boas, DPM;  Location: [facility] ORS;  Service: Podiatry;  Laterality: Left;   FOOT SURGERY Right 04/16/2021   FRACTURE SURGERY  1970s   Left elbow. Right wrist   SKIN CANCER EXCISION     SPINE SURGERY     WRIST SURGERY Right    Trauma resulting  in prosthetics in the right wrist and elbow    Family History  Problem Relation Age of Onset   Cancer Mother        Lung   Hypertension Mother    Arthritis Mother    Heart Problems Father    AAA (abdominal aortic aneurysm) Father    Heart attack Brother    Hypercholesterolemia Brother    Neuropathy Neg Hx     Allergies  Allergen Reactions   Hydrocodone Other (See Comments)    Palpitations, increased heart rate   Statins    Atorvastatin      Aching and feeling down   Crestor [Rosuvastatin] Other (See Comments)    Muscle aches   Lescol  [Fluvastatin]     Muscle Aches       Latest Ref Rng & Units 04/07/2024   12:45 PM 04/04/2024    3:33 AM 04/03/2024   11:21 AM  CBC  WBC 4.0 - 10.5 K/uL 14.8  15.6  10.6   Hemoglobin 13.0 - 17.0 g/dL 89.7  9.1  89.7   Hematocrit 39.0 - 52.0 % 30.3  27.3  31.1   Platelets 150 - 400 K/uL 306  249  238       CMP     Component Value Date/Time   NA 137 04/07/2024 1245   NA 142 11/28/2023 0852   K 3.9 04/07/2024 1245   CL 102 04/07/2024 1245   CO2 21 (L) 04/07/2024 1245   GLUCOSE 141 (H) 04/07/2024 1245   BUN 15 04/07/2024 1245   BUN 15 11/28/2023 0852   CREATININE 0.62 04/07/2024 1245   CALCIUM  8.5 (L) 04/07/2024 1245   PROT 6.2 (L) 04/07/2024 1245   PROT 5.8 (L) 11/28/2023 0852   ALBUMIN  3.5 04/07/2024 1245   ALBUMIN  4.1 11/28/2023 0852   AST 21 04/07/2024 1245   ALT 18 04/07/2024 1245   ALKPHOS 53 04/07/2024 1245   BILITOT 1.3 (H) 04/07/2024 1245   BILITOT 0.4 11/28/2023 0852   EGFR 95 11/28/2023 0852   GFRNONAA >60 04/07/2024 1245     No results found.     Assessment & Plan:   1. Infrarenal abdominal aortic aneurysm (AAA) without  rupture (HCC) (Primary) Recommend:  Patient is status post successful endovascular repair of the AAA.   No further intervention is required at this time.   No endoleak is detected and the aneurysm sac is stable.  The patient will continue antiplatelet therapy as prescribed as well as aggressive management of hyperlipidemia. Exercise is encouraged.   However, endografts require continued surveillance with ultrasound or CT scan. This is mandatory to detect any changes that allow repressurization of the aneurysm sac.  The patient is informed that this would be asymptomatic.  The patient is reminded that lifelong routine surveillance is a necessity with an endograft. Patient will continue to follow-up at the specified interval with ultrasound of the aorta.  2. Primary hypertension Continue antihypertensive medications as already ordered, these medications have been reviewed and there are no changes at this time.   Current Outpatient Medications on File Prior to Visit  Medication Sig Dispense Refill   Ascorbic Acid (VITAMIN C PO) Take 1 tablet by mouth daily at 6 (six) AM.     aspirin  EC 81 MG tablet Take 1 tablet (81 mg total) by mouth daily. Swallow whole.     budesonide -formoterol  (SYMBICORT ) 160-4.5 MCG/ACT inhaler Inhale 2 puffs into the lungs in the morning and at bedtime. 1 each 0   Cholecalciferol (VITAMIN  D-3 PO) Take 1 tablet by mouth daily.     clopidogrel  (PLAVIX ) 75 MG tablet Take 1 tablet (75 mg total) by mouth daily. 30 tablet 11   ezetimibe  (ZETIA ) 10 MG tablet Take 1 tablet (10 mg total) by mouth daily. (Patient taking differently: Take 10 mg by mouth every morning.) 90 tablet 3   gabapentin  (NEURONTIN ) 300 MG capsule Take 300mg  AM, 300mg  PM, 900mg  QHS (Patient taking differently: Take 300-600 mg by mouth 2 (two) times daily. Take 300mg  AM,  600mg  QHS) 450 capsule 3   glucose blood (ONETOUCH VERIO) test strip TEST ONCE DAILY  Dx: E11.9 100 each 3   hydrochlorothiazide   (HYDRODIURIL ) 50 MG tablet Take 1 tablet (50 mg total) by mouth daily. 90 tablet 3   lovastatin  (MEVACOR ) 40 MG tablet Take 40 mg by mouth every morning.     omeprazole  (PRILOSEC) 40 MG capsule Take 1 capsule (40 mg total) by mouth daily. (Patient taking differently: Take 40 mg by mouth every morning.) 90 capsule 4   OneTouch Delica Lancets 33G MISC Use to check blood sugar once a day.  DX: E11.9 100 each 3   oxyCODONE -acetaminophen  (PERCOCET/ROXICET) 5-325 MG tablet Take 1-2 tablets by mouth every 4 (four) hours as needed for moderate pain (pain score 4-6) or severe pain (pain score 7-10) (fgbdh). 30 tablet 0   traMADol  (ULTRAM ) 50 MG tablet Take 1-2 tablets (50-100 mg total) by mouth at bedtime as needed. 60 tablet 2   No current facility-administered medications on file prior to visit.    There are no Patient Instructions on file for this visit. No follow-ups on file.   Ahyan Kreeger E Apollo Timothy, NP

## 2024-06-02 ENCOUNTER — Telehealth

## 2024-06-02 DIAGNOSIS — J069 Acute upper respiratory infection, unspecified: Secondary | ICD-10-CM

## 2024-06-03 MED ORDER — BENZONATATE 100 MG PO CAPS: 100.0000 mg | ORAL_CAPSULE | Freq: Three times a day (TID) | ORAL | 0 refills | Status: AC | PRN

## 2024-06-03 MED ORDER — FLUTICASONE PROPIONATE 50 MCG/ACT NA SUSP: 2.0000 | Freq: Every day | NASAL | 0 refills | Status: AC

## 2024-06-03 NOTE — Progress Notes (Signed)

## 2024-06-10 ENCOUNTER — Telehealth: Admitting: Family Medicine

## 2024-06-10 DIAGNOSIS — R052 Subacute cough: Secondary | ICD-10-CM

## 2024-06-10 NOTE — Progress Notes (Signed)
  Hi Thomas Morgan, due to persistence of your symptoms and your recent procedure, I feel your condition warrants further evaluation and I recommend that you be seen in a face-to-face visit. I would like you to be seen in person so that they can complete a physical exam and listen to your heart and lungs before deciding on your treatment.   NOTE: There will be NO CHARGE for this E-Visit   If you are having a true medical emergency, please call 911.     For an urgent face to face visit, [place] has multiple urgent care centers for your convenience.  Click the link below for the full list of locations and hours, walk-in wait times, appointment scheduling options and driving directions:  Urgent Care - [place], [place], [place], [place], [place], [place]  [place]     Your MyChart E-visit questionnaire answers were reviewed by a board certified advanced clinical practitioner to complete your personal care plan based on your specific symptoms.    Thank you for using e-Visits.

## 2024-07-18 ENCOUNTER — Other Ambulatory Visit (INDEPENDENT_AMBULATORY_CARE_PROVIDER_SITE_OTHER): Payer: Self-pay | Admitting: Vascular Surgery

## 2024-07-18 DIAGNOSIS — I714 Abdominal aortic aneurysm, without rupture, unspecified: Secondary | ICD-10-CM

## 2024-07-20 NOTE — Progress Notes (Unsigned)
 MRN : [id number]  Thomas Morgan is a 73 y.o. (Feb 24, 1951) male who presents with chief complaint of check circulation.  History of Present Illness:   The patient returns to the office for surveillance of an abdominal aortic aneurysm status post stent graft placement on 04/03/2024.   Procedure: Placement of a 31 x 14 x 13 Gore Excluder Endoprosthesis main body with a 16 x 10 ipsilateral extension and a 20 x 10 contralateral limb  Patient denies abdominal pain or back pain, no other abdominal complaints.  No symptoms consistent with distal embolization No changes in claudication distance or new rest pain symptoms. No interval development of new ulcers or wounds  There have been no significant interval changes in his overall healthcare since his last visit.   Patient denies amaurosis fugax or TIA symptoms.  The patient denies recent episodes of angina or shortness of breath.   Duplex US  of the aorta and iliac arteries shows a 3.98 cm AAA sac with no endoleak, decrease in the sac compared to the previous study.  No outpatient medications have been marked as taking for the 07/22/24 encounter (Appointment) with Jama, Cordella MATSU, MD.    Past Medical History:  Diagnosis Date   Allergy    Arthritis    Atherosclerosis of aorta    Basal cell carcinoma    CAD (coronary artery disease)    Complication of anesthesia    a.) PONV   Diastolic dysfunction    Diverticulosis    DM (diabetes mellitus), type 2 (HCC)    ED (erectile dysfunction)    Emphysema of lung (HCC)    GERD (gastroesophageal reflux disease)    Hepatic steatosis    History of adenomatous polyp of colon    HTN (hypertension)    Hyperlipidemia    Infrarenal abdominal aortic aneurysm (AAA) without rupture    Long-term use of aspirin  therapy    Lumbar canal stenosis    PAD (peripheral artery disease)    PONV (postoperative nausea and vomiting)     Psoriasis    Tobacco abuse    Ventral hernia    Vitamin D  deficiency     Past Surgical History:  Procedure Laterality Date   BACK SURGERY     BASAL CELL CARCINOMA EXCISION Left 2020   left arm   CYST REMOVAL TRUNK  2020   on his back   ELBOW SURGERY Left    ENDOVASCULAR STENT GRAFT (AAA) N/A 04/03/2024   Procedure: ENDOVASCULAR REPAIR/STENT GRAFT;  Surgeon: Jama Cordella MATSU, MD;  Location: [facility];  Service: Cardiovascular;  Laterality: N/A;   EXCISION MORTON'S NEUROMA Left 03/24/2017   Procedure: EXCISION MORTON'S NEUROMA;  Surgeon: Neill Boas, DPM;  Location: [facility] OR[facility];  Service: Podiatry;  Laterality: Left;   FOOT SURGERY Right 04/16/2021   FRACTURE SURGERY  1970s   Left elbow. Right wrist   SKIN CANCER EXCISION     SPINE SURGERY     WRIST SURGERY Right    Trauma resulting in prosthetics in the right wrist and elbow    Social History Social History   Tobacco  Use   Smoking status: Every Day    Current packs/day: 1.00    Average packs/day: 1 pack/day for 48.0 years (48.0 ttl pk-yrs)    Types: Cigarettes   Smokeless tobacco: Never   Tobacco comments:    1 pack a day,   Vaping Use   Vaping status: Never Used  Substance Use Topics   Alcohol use: No   Drug use: No    Family History Family History  Problem Relation Age of Onset   Cancer Mother        Lung   Hypertension Mother    Arthritis Mother    Heart Problems Father    AAA (abdominal aortic aneurysm) Father    Heart attack Brother    Hypercholesterolemia Brother    Neuropathy Neg Hx     Allergies  Allergen Reactions   Hydrocodone Other (See Comments)    Palpitations, increased heart rate   Statins    Atorvastatin      Aching and feeling down   Crestor [Rosuvastatin] Other (See Comments)    Muscle aches   Lescol  [Fluvastatin]     Muscle Aches     REVIEW OF SYSTEMS (Negative unless checked)  Constitutional: [] Weight loss  [] Fever  [] Chills Cardiac: [] Chest pain   [] Chest  pressure   [] Palpitations   [] Shortness of breath when laying flat   [] Shortness of breath with exertion. Vascular:  [x] Pain in legs with walking   [] Pain in legs at rest  [] History of DVT   [] Phlebitis   [] Swelling in legs   [] Varicose veins   [] Non-healing ulcers Pulmonary:   [] Uses home oxygen   [] Productive cough   [] Hemoptysis   [] Wheeze  [] COPD   [] Asthma Neurologic:  [] Dizziness   [] Seizures   [] History of stroke   [] History of TIA  [] Aphasia   [] Vissual changes   [] Weakness or numbness in arm   [] Weakness or numbness in leg Musculoskeletal:   [] Joint swelling   [] Joint pain   [] Low back pain Hematologic:  [] Easy bruising  [] Easy bleeding   [] Hypercoagulable state   [] Anemic Gastrointestinal:  [] Diarrhea   [] Vomiting  [] Gastroesophageal reflux/heartburn   [] Difficulty swallowing. Genitourinary:  [] Chronic kidney disease   [] Difficult urination  [] Frequent urination   [] Blood in urine Skin:  [] Rashes   [] Ulcers  Psychological:  [] History of anxiety   []  History of major depression.  Physical Examination  There were no vitals filed for this visit. There is no height or weight on file to calculate BMI. Gen: WD/WN, NAD Head: [place]/AT, No temporalis wasting.  Ear/Nose/Throat: Hearing grossly intact, nares w/o erythema or drainage Eyes: PER, EOMI, sclera nonicteric.  Neck: Supple, no masses.  No bruit or JVD.  Pulmonary:  Good air movement, no audible wheezing, no use of accessory muscles.  Cardiac: RRR, normal S1, S2, no Murmurs. Vascular:  mild trophic changes, no open wounds Vessel Right Left  Radial Palpable Palpable  PT Not Palpable Not Palpable  DP Not Palpable Not Palpable  Gastrointestinal: soft, non-distended. No guarding/no peritoneal signs.  Musculoskeletal: M/S 5/5 throughout.  No visible deformity.  Neurologic: CN 2-12 intact. Pain and light touch intact in extremities.  Symmetrical.  Speech is fluent. Motor exam as listed above. Psychiatric: Judgment intact, Mood & affect  appropriate for pt's clinical situation. Dermatologic: No rashes or ulcers noted.  No changes consistent with cellulitis.   CBC Lab Results  Component Value Date   WBC 14.8 (H) 04/07/2024   HGB 10.2 (L) 04/07/2024   HCT  30.3 (L) 04/07/2024   MCV 95.9 04/07/2024   PLT 306 04/07/2024    BMET    Component Value Date/Time   NA 137 04/07/2024 1245   NA 142 11/28/2023 0852   K 3.9 04/07/2024 1245   CL 102 04/07/2024 1245   CO2 21 (L) 04/07/2024 1245   GLUCOSE 141 (H) 04/07/2024 1245   BUN 15 04/07/2024 1245   BUN 15 11/28/2023 0852   CREATININE 0.62 04/07/2024 1245   CALCIUM  8.5 (L) 04/07/2024 1245   GFRNONAA >60 04/07/2024 1245   GFRAA 108 04/10/2020 1456   CrCl cannot be calculated (Patient's most recent lab result is older than the maximum 21 days allowed.).  COAG Lab Results  Component Value Date   INR 1.2 04/03/2024    Radiology No results found.   Assessment/Plan 1. Infrarenal abdominal aortic aneurysm (AAA) without rupture (Primary) Recommend:  Patient is status post successful endovascular repair of the AAA.   No further intervention is required at this time.   No endoleak is detected and the aneurysm sac is stable.  The patient will continue antiplatelet therapy as prescribed as well as aggressive management of hyperlipidemia. Exercise is encouraged.   However, endografts require continued surveillance with ultrasound or CT scan. This is mandatory to detect any changes that allow repressurization of the aneurysm sac.  The patient is informed that this would be asymptomatic.  The patient is reminded that lifelong routine surveillance is a necessity with an endograft. Patient will continue to follow-up at the specified interval with ultrasound of the aorta. - VAS US  AORTA/IVC/ILIACS; Future - VAS US  AORTA/IVC/ILIACS; Future  2. PAD (peripheral artery disease)  Recommend:  The patient has evidence of atherosclerosis of the lower extremities with  claudication.  The patient does not voice lifestyle limiting changes at this point in time.  Noninvasive studies do not suggest clinically significant change.  No invasive studies, angiography or surgery at this time The patient should continue walking and begin a more formal exercise program.  The patient should continue antiplatelet therapy and aggressive treatment of the lipid abnormalities  No changes in the patient's medications at this time  Continued surveillance is indicated as atherosclerosis is likely to progress with time.    The patient will continue follow up with noninvasive studies as ordered.   3. Coronary arteriosclerosis Continue cardiac and antihypertensive medications as already ordered and reviewed, no changes at this time.  Continue statin as ordered and reviewed, no changes at this time  Nitrates PRN for chest pain  4. Primary hypertension Continue antihypertensive medications as already ordered, these medications have been reviewed and there are no changes at this time.  5. Pulmonary emphysema, unspecified emphysema type Continue pulmonary medications and aerosols as already ordered, these medications have been reviewed and there are no changes at this time.   6. Hyperlipidemia, unspecified hyperlipidemia type Continue statin as ordered and reviewed, no changes at this time    Cordella Shawl, MD  07/20/2024 4:57 PM

## 2024-07-22 ENCOUNTER — Ambulatory Visit (INDEPENDENT_AMBULATORY_CARE_PROVIDER_SITE_OTHER): Admitting: Vascular Surgery

## 2024-07-22 ENCOUNTER — Encounter (INDEPENDENT_AMBULATORY_CARE_PROVIDER_SITE_OTHER): Payer: Self-pay | Admitting: Vascular Surgery

## 2024-07-22 ENCOUNTER — Ambulatory Visit (INDEPENDENT_AMBULATORY_CARE_PROVIDER_SITE_OTHER)

## 2024-07-22 VITALS — BP 136/85 | HR 65 | Ht 70.0 in | Wt 189.2 lb

## 2024-07-22 DIAGNOSIS — I714 Abdominal aortic aneurysm, without rupture, unspecified: Secondary | ICD-10-CM | POA: Diagnosis not present

## 2024-07-22 DIAGNOSIS — I739 Peripheral vascular disease, unspecified: Secondary | ICD-10-CM

## 2024-07-22 DIAGNOSIS — I10 Essential (primary) hypertension: Secondary | ICD-10-CM

## 2024-07-22 DIAGNOSIS — E785 Hyperlipidemia, unspecified: Secondary | ICD-10-CM | POA: Diagnosis not present

## 2024-07-22 DIAGNOSIS — I7143 Infrarenal abdominal aortic aneurysm, without rupture: Secondary | ICD-10-CM | POA: Diagnosis not present

## 2024-07-22 DIAGNOSIS — I2510 Atherosclerotic heart disease of native coronary artery without angina pectoris: Secondary | ICD-10-CM | POA: Diagnosis not present

## 2024-07-22 DIAGNOSIS — J439 Emphysema, unspecified: Secondary | ICD-10-CM | POA: Diagnosis not present

## 2024-07-23 ENCOUNTER — Encounter (INDEPENDENT_AMBULATORY_CARE_PROVIDER_SITE_OTHER): Payer: Self-pay | Admitting: Vascular Surgery

## 2024-07-31 ENCOUNTER — Telehealth: Admitting: Physician Assistant

## 2024-07-31 DIAGNOSIS — J441 Chronic obstructive pulmonary disease with (acute) exacerbation: Secondary | ICD-10-CM

## 2024-07-31 NOTE — Progress Notes (Signed)
  Because of your history of COPD, I feel your condition warrants further evaluation and I recommend that you be seen in a face-to-face visit.   NOTE: There will be NO CHARGE for this E-Visit   If you are having a true medical emergency, please call 911.     For an urgent face to face visit, [place] has multiple urgent care centers for your convenience.  Click the link below for the full list of locations and hours, walk-in wait times, appointment scheduling options and driving directions:  Urgent Care - [place], [place], [place], [place], [place], [place]  [place]     Your MyChart E-visit questionnaire answers were reviewed by a board certified advanced clinical practitioner to complete your personal care plan based on your specific symptoms.    Thank you for using e-Visits.

## 2024-08-02 DIAGNOSIS — R0981 Nasal congestion: Secondary | ICD-10-CM | POA: Diagnosis not present

## 2024-08-02 DIAGNOSIS — J441 Chronic obstructive pulmonary disease with (acute) exacerbation: Secondary | ICD-10-CM | POA: Diagnosis not present

## 2024-08-02 DIAGNOSIS — J189 Pneumonia, unspecified organism: Secondary | ICD-10-CM | POA: Diagnosis not present

## 2024-08-02 DIAGNOSIS — R051 Acute cough: Secondary | ICD-10-CM | POA: Diagnosis not present

## 2024-08-26 ENCOUNTER — Other Ambulatory Visit: Payer: Self-pay | Admitting: Podiatry

## 2024-10-01 DIAGNOSIS — E119 Type 2 diabetes mellitus without complications: Secondary | ICD-10-CM | POA: Diagnosis not present

## 2024-10-01 DIAGNOSIS — H2513 Age-related nuclear cataract, bilateral: Secondary | ICD-10-CM | POA: Diagnosis not present

## 2024-10-01 LAB — OPHTHALMOLOGY REPORT-SCANNED

## 2024-10-08 ENCOUNTER — Other Ambulatory Visit (HOSPITAL_COMMUNITY): Payer: Self-pay

## 2024-10-15 ENCOUNTER — Telehealth: Payer: Self-pay | Admitting: Pharmacy Technician

## 2024-10-15 NOTE — Telephone Encounter (Signed)
 Pharmacy Patient Advocate Encounter   Received notification from Onbase that prior authorization for Nexletol  180MG  tablets is due for renewal.   Insurance verification completed.   The patient is insured through [employer].  Action: Medication has been discontinued. Archived Key: [id number]

## 2024-10-23 ENCOUNTER — Ambulatory Visit: Payer: Self-pay

## 2024-10-23 NOTE — Telephone Encounter (Signed)
 FYI Only or Action Required?: FYI only for provider: ED advised. ED Refused, may go tomorrow.  Patient was last seen in primary care on 04/09/2024 by Vivienne Delon HERO, PA-C.  Called Nurse Triage reporting Stool Color Change and Blood In Stools.  Symptoms began several weeks ago.  Interventions attempted: Nothing.  Symptoms are: unchanged.  Triage Disposition: Go to ED Now (Notify PCP)  Patient/caregiver understands and will follow disposition?: No, wishes to speak with PCP  Copied from CRM #[id number]. Topic: Clinical - Red Word Triage >> Oct 23, 2024 10:06 AM Antwanette L wrote: Red Word that prompted transfer to Nurse Triage: The patient is reporting stomach cramps and  blood in his stool. He states this has occurred twice within the past two weeks Reason for Disposition  High-risk adult (e.g., prior surgery on aorta, abdominal aortic aneurysm)  Answer Assessment - Initial Assessment Questions States he hasn't had a colonoscopy in a while and is due for one. Patient had aneurism repair 6 months ago. Patient states he may go to the ER tomorrow.  1. APPEARANCE of BLOOD: What color is it? Is it passed separately, on the surface of the stool, or mixed in with the stool?      Was not a lot, noticed blood mostly when he cleaned himself. 2. AMOUNT: How much blood was passed?      Not a lot 3. FREQUENCY: How many times has blood been passed with the stools?      2 4. ONSET: When was the blood first seen in the stools? (Days or weeks)      2 weeks ago 5. DIARRHEA: Is there also some diarrhea? If Yes, ask: How many diarrhea stools in the past 24 hours?      Denies 6. CONSTIPATION: Do you have constipation? If Yes, ask: How bad is it?     Little bit 7. RECURRENT SYMPTOMS: Have you had blood in your stools before? If Yes, ask: When was the last time? and What happened that time?      Yes, but went away on it's own. That happened 1 year ago 8. BLOOD THINNERS: Do  you take any blood thinners? (e.g., aspirin , clopidogrel  / Plavix , coumadin, heparin ). Notes: Other strong blood thinners include: Arixtra (fondaparinux), Eliquis (apixaban), Pradaxa (dabigatran), and Xarelto (rivaroxaban).     Aspirin  9. OTHER SYMPTOMS: Do you have any other symptoms?  (e.g., abdomen pain, vomiting, dizziness, fever)     Minimal abdominal pain  Protocols used: Rectal Bleeding-A-AH

## 2024-11-14 ENCOUNTER — Other Ambulatory Visit: Payer: Self-pay | Admitting: Family Medicine

## 2024-11-19 ENCOUNTER — Telehealth: Payer: Self-pay

## 2024-11-19 MED ORDER — TRAMADOL HCL 50 MG PO TABS: 50.0000 mg | ORAL_TABLET | Freq: Every evening | ORAL | 2 refills | Status: AC | PRN

## 2024-11-19 NOTE — Telephone Encounter (Signed)
 Copied from CRM #[id number]. Topic: Clinical - Medication Question >> Nov 18, 2024 10:42 AM Myrick T wrote: Reason for CRM: patient called to find out why his request for traMADol  (ULTRAM ) 50 MG tablet was denied stating he need an appt when he has one scheduled on 2/6. Patient says he can not sleep without the medication. Please advise

## 2024-11-29 ENCOUNTER — Ambulatory Visit: Payer: Medicare PPO | Admitting: Family Medicine

## 2024-11-29 ENCOUNTER — Encounter: Payer: Self-pay | Admitting: Family Medicine

## 2024-11-29 VITALS — BP 133/70 | HR 62 | Resp 16 | Ht 70.0 in | Wt 193.0 lb

## 2024-11-29 DIAGNOSIS — I10 Essential (primary) hypertension: Secondary | ICD-10-CM

## 2024-11-29 DIAGNOSIS — L409 Psoriasis, unspecified: Secondary | ICD-10-CM

## 2024-11-29 DIAGNOSIS — J439 Emphysema, unspecified: Secondary | ICD-10-CM

## 2024-11-29 DIAGNOSIS — I700 Atherosclerosis of aorta: Secondary | ICD-10-CM

## 2024-11-29 DIAGNOSIS — I739 Peripheral vascular disease, unspecified: Secondary | ICD-10-CM

## 2024-11-29 DIAGNOSIS — I2510 Atherosclerotic heart disease of native coronary artery without angina pectoris: Secondary | ICD-10-CM

## 2024-11-29 DIAGNOSIS — Z125 Encounter for screening for malignant neoplasm of prostate: Secondary | ICD-10-CM

## 2024-11-29 DIAGNOSIS — I719 Aortic aneurysm of unspecified site, without rupture: Secondary | ICD-10-CM

## 2024-11-29 DIAGNOSIS — E1159 Type 2 diabetes mellitus with other circulatory complications: Secondary | ICD-10-CM

## 2024-11-29 DIAGNOSIS — E785 Hyperlipidemia, unspecified: Secondary | ICD-10-CM

## 2024-11-29 DIAGNOSIS — R269 Unspecified abnormalities of gait and mobility: Secondary | ICD-10-CM

## 2024-11-29 DIAGNOSIS — Z720 Tobacco use: Secondary | ICD-10-CM

## 2024-11-29 DIAGNOSIS — I7143 Infrarenal abdominal aortic aneurysm, without rupture: Secondary | ICD-10-CM

## 2024-11-29 DIAGNOSIS — R202 Paresthesia of skin: Secondary | ICD-10-CM

## 2024-11-29 DIAGNOSIS — G4710 Hypersomnia, unspecified: Secondary | ICD-10-CM

## 2024-11-29 DIAGNOSIS — Z1211 Encounter for screening for malignant neoplasm of colon: Secondary | ICD-10-CM

## 2024-11-29 DIAGNOSIS — K219 Gastro-esophageal reflux disease without esophagitis: Secondary | ICD-10-CM

## 2024-11-29 DIAGNOSIS — K760 Fatty (change of) liver, not elsewhere classified: Secondary | ICD-10-CM

## 2024-11-29 DIAGNOSIS — E559 Vitamin D deficiency, unspecified: Secondary | ICD-10-CM

## 2024-11-29 DIAGNOSIS — N529 Male erectile dysfunction, unspecified: Secondary | ICD-10-CM

## 2025-01-01 ENCOUNTER — Ambulatory Visit

## 2025-01-20 ENCOUNTER — Ambulatory Visit (INDEPENDENT_AMBULATORY_CARE_PROVIDER_SITE_OTHER): Admitting: Vascular Surgery

## 2025-01-20 ENCOUNTER — Encounter (INDEPENDENT_AMBULATORY_CARE_PROVIDER_SITE_OTHER)

## 2025-12-01 ENCOUNTER — Encounter: Admitting: Family Medicine
